# Patient Record
Sex: Female | Born: 1955 | Race: White | Hispanic: No | Marital: Married | Smoking: Former smoker
Health system: Southern US, Community
[De-identification: ages and names within clinical notes are randomized; demographics above are authoritative.]

## PROBLEM LIST (undated history)

## (undated) DIAGNOSIS — G4730 Sleep apnea, unspecified: Secondary | ICD-10-CM

## (undated) DIAGNOSIS — E0590 Thyrotoxicosis, unspecified without thyrotoxic crisis or storm: Secondary | ICD-10-CM

## (undated) DIAGNOSIS — J45909 Unspecified asthma, uncomplicated: Secondary | ICD-10-CM

## (undated) DIAGNOSIS — O24419 Gestational diabetes mellitus in pregnancy, unspecified control: Secondary | ICD-10-CM

## (undated) DIAGNOSIS — E039 Hypothyroidism, unspecified: Secondary | ICD-10-CM

## (undated) DIAGNOSIS — N200 Calculus of kidney: Secondary | ICD-10-CM

## (undated) DIAGNOSIS — Z1509 Genetic susceptibility to other malignant neoplasm: Secondary | ICD-10-CM

## (undated) DIAGNOSIS — C801 Malignant (primary) neoplasm, unspecified: Secondary | ICD-10-CM

## (undated) DIAGNOSIS — Z1501 Genetic susceptibility to malignant neoplasm of breast: Secondary | ICD-10-CM

## (undated) HISTORY — PX: OOPHORECTOMY: SHX86

## (undated) HISTORY — DX: Sleep apnea, unspecified: G47.30

## (undated) HISTORY — DX: Unspecified asthma, uncomplicated: J45.909

## (undated) HISTORY — DX: Hypothyroidism, unspecified: E03.9

## (undated) HISTORY — PX: OTHER SURGICAL HISTORY: SHX169

## (undated) HISTORY — DX: Calculus of kidney: N20.0

## (undated) HISTORY — DX: Genetic susceptibility to malignant neoplasm of breast: Z15.01

## (undated) HISTORY — DX: Genetic susceptibility to malignant neoplasm of breast: Z15.09

## (undated) HISTORY — PX: ABDOMINAL HYSTERECTOMY: SHX81

## (undated) HISTORY — DX: Gestational diabetes mellitus in pregnancy, unspecified control: O24.419

## (undated) HISTORY — PX: TONSILLECTOMY AND ADENOIDECTOMY: SUR1326

## (undated) HISTORY — DX: Thyrotoxicosis, unspecified without thyrotoxic crisis or storm: E05.90

---

## 2004-11-28 DIAGNOSIS — Z9221 Personal history of antineoplastic chemotherapy: Secondary | ICD-10-CM

## 2004-11-28 DIAGNOSIS — C801 Malignant (primary) neoplasm, unspecified: Secondary | ICD-10-CM

## 2004-11-28 HISTORY — DX: Personal history of antineoplastic chemotherapy: Z92.21

## 2004-11-28 HISTORY — DX: Malignant (primary) neoplasm, unspecified: C80.1

## 2005-05-10 ENCOUNTER — Ambulatory Visit: Payer: Self-pay

## 2005-06-07 ENCOUNTER — Ambulatory Visit: Payer: Self-pay | Admitting: General Surgery

## 2005-08-05 ENCOUNTER — Ambulatory Visit: Payer: Self-pay | Admitting: Oncology

## 2005-09-12 ENCOUNTER — Ambulatory Visit: Payer: Self-pay | Admitting: Oncology

## 2005-09-28 ENCOUNTER — Ambulatory Visit: Payer: Self-pay | Admitting: Oncology

## 2005-10-28 ENCOUNTER — Ambulatory Visit: Payer: Self-pay | Admitting: Oncology

## 2005-11-30 ENCOUNTER — Ambulatory Visit: Payer: Self-pay | Admitting: Internal Medicine

## 2006-01-04 ENCOUNTER — Ambulatory Visit: Payer: Self-pay | Admitting: Oncology

## 2006-01-30 ENCOUNTER — Ambulatory Visit: Payer: Self-pay | Admitting: Oncology

## 2006-02-26 ENCOUNTER — Ambulatory Visit: Payer: Self-pay | Admitting: Oncology

## 2006-03-24 ENCOUNTER — Emergency Department: Payer: Self-pay | Admitting: Unknown Physician Specialty

## 2006-03-24 ENCOUNTER — Other Ambulatory Visit: Payer: Self-pay

## 2006-03-28 ENCOUNTER — Ambulatory Visit: Payer: Self-pay | Admitting: Oncology

## 2006-04-20 ENCOUNTER — Inpatient Hospital Stay: Payer: Self-pay | Admitting: Oncology

## 2006-04-28 ENCOUNTER — Ambulatory Visit: Payer: Self-pay | Admitting: Oncology

## 2006-05-28 ENCOUNTER — Ambulatory Visit: Payer: Self-pay | Admitting: Oncology

## 2006-06-28 ENCOUNTER — Ambulatory Visit: Payer: Self-pay | Admitting: Oncology

## 2006-07-29 ENCOUNTER — Ambulatory Visit: Payer: Self-pay | Admitting: Oncology

## 2006-08-28 ENCOUNTER — Ambulatory Visit: Payer: Self-pay | Admitting: Oncology

## 2006-09-28 ENCOUNTER — Ambulatory Visit: Payer: Self-pay | Admitting: Oncology

## 2006-10-04 ENCOUNTER — Ambulatory Visit: Payer: Self-pay | Admitting: Urology

## 2006-11-15 ENCOUNTER — Ambulatory Visit: Payer: Self-pay | Admitting: Oncology

## 2006-11-28 ENCOUNTER — Ambulatory Visit: Payer: Self-pay | Admitting: Oncology

## 2006-12-29 ENCOUNTER — Ambulatory Visit: Payer: Self-pay | Admitting: Oncology

## 2007-01-27 ENCOUNTER — Ambulatory Visit: Payer: Self-pay | Admitting: Oncology

## 2007-01-27 ENCOUNTER — Ambulatory Visit: Payer: Self-pay | Admitting: Internal Medicine

## 2007-02-27 ENCOUNTER — Ambulatory Visit: Payer: Self-pay | Admitting: Oncology

## 2007-02-27 ENCOUNTER — Ambulatory Visit: Payer: Self-pay | Admitting: Internal Medicine

## 2007-03-29 ENCOUNTER — Ambulatory Visit: Payer: Self-pay | Admitting: Oncology

## 2007-03-29 ENCOUNTER — Ambulatory Visit: Payer: Self-pay | Admitting: Internal Medicine

## 2007-03-30 ENCOUNTER — Ambulatory Visit: Payer: Self-pay | Admitting: Gastroenterology

## 2007-04-29 ENCOUNTER — Ambulatory Visit: Payer: Self-pay | Admitting: Internal Medicine

## 2007-05-03 ENCOUNTER — Ambulatory Visit: Payer: Self-pay | Admitting: Internal Medicine

## 2007-05-29 ENCOUNTER — Ambulatory Visit: Payer: Self-pay | Admitting: Internal Medicine

## 2007-06-29 ENCOUNTER — Ambulatory Visit: Payer: Self-pay | Admitting: Internal Medicine

## 2007-07-30 ENCOUNTER — Ambulatory Visit: Payer: Self-pay | Admitting: Internal Medicine

## 2007-08-29 ENCOUNTER — Ambulatory Visit: Payer: Self-pay | Admitting: Internal Medicine

## 2007-09-29 ENCOUNTER — Ambulatory Visit: Payer: Self-pay | Admitting: Oncology

## 2007-10-24 ENCOUNTER — Ambulatory Visit: Payer: Self-pay | Admitting: Internal Medicine

## 2007-10-29 ENCOUNTER — Ambulatory Visit: Payer: Self-pay | Admitting: Oncology

## 2007-10-29 ENCOUNTER — Ambulatory Visit: Payer: Self-pay | Admitting: Internal Medicine

## 2007-11-29 ENCOUNTER — Ambulatory Visit: Payer: Self-pay | Admitting: Oncology

## 2007-12-05 ENCOUNTER — Ambulatory Visit: Payer: Self-pay | Admitting: Oncology

## 2007-12-13 ENCOUNTER — Ambulatory Visit: Payer: Self-pay | Admitting: Gynecologic Oncology

## 2007-12-30 ENCOUNTER — Ambulatory Visit: Payer: Self-pay | Admitting: Oncology

## 2008-01-27 ENCOUNTER — Ambulatory Visit: Payer: Self-pay | Admitting: Oncology

## 2008-02-27 ENCOUNTER — Ambulatory Visit: Payer: Self-pay | Admitting: Oncology

## 2008-03-14 ENCOUNTER — Ambulatory Visit: Payer: Self-pay | Admitting: Oncology

## 2008-03-28 ENCOUNTER — Ambulatory Visit: Payer: Self-pay | Admitting: Oncology

## 2008-05-06 DIAGNOSIS — Z8249 Family history of ischemic heart disease and other diseases of the circulatory system: Secondary | ICD-10-CM | POA: Insufficient documentation

## 2008-05-06 DIAGNOSIS — Z833 Family history of diabetes mellitus: Secondary | ICD-10-CM | POA: Insufficient documentation

## 2008-05-06 DIAGNOSIS — J45909 Unspecified asthma, uncomplicated: Secondary | ICD-10-CM | POA: Insufficient documentation

## 2008-05-08 ENCOUNTER — Ambulatory Visit: Payer: Self-pay | Admitting: Gynecologic Oncology

## 2008-05-13 ENCOUNTER — Ambulatory Visit: Payer: Self-pay | Admitting: Oncology

## 2008-05-28 ENCOUNTER — Ambulatory Visit: Payer: Self-pay | Admitting: Oncology

## 2008-05-29 ENCOUNTER — Ambulatory Visit: Payer: Self-pay

## 2008-06-28 ENCOUNTER — Ambulatory Visit: Payer: Self-pay | Admitting: Oncology

## 2008-06-28 ENCOUNTER — Ambulatory Visit: Payer: Self-pay | Admitting: Gynecologic Oncology

## 2008-07-01 ENCOUNTER — Ambulatory Visit: Payer: Self-pay | Admitting: Gynecologic Oncology

## 2008-07-08 ENCOUNTER — Ambulatory Visit: Payer: Self-pay | Admitting: Gynecologic Oncology

## 2008-07-29 ENCOUNTER — Ambulatory Visit: Payer: Self-pay | Admitting: Gynecologic Oncology

## 2008-07-29 ENCOUNTER — Ambulatory Visit: Payer: Self-pay | Admitting: Oncology

## 2008-08-28 ENCOUNTER — Ambulatory Visit: Payer: Self-pay | Admitting: Internal Medicine

## 2008-09-12 ENCOUNTER — Ambulatory Visit: Payer: Self-pay | Admitting: Oncology

## 2008-09-28 ENCOUNTER — Ambulatory Visit: Payer: Self-pay | Admitting: Oncology

## 2008-09-28 ENCOUNTER — Ambulatory Visit: Payer: Self-pay | Admitting: Internal Medicine

## 2008-10-28 ENCOUNTER — Ambulatory Visit: Payer: Self-pay | Admitting: Oncology

## 2008-11-28 ENCOUNTER — Ambulatory Visit: Payer: Self-pay | Admitting: Oncology

## 2008-12-11 ENCOUNTER — Ambulatory Visit: Payer: Self-pay | Admitting: Oncology

## 2008-12-29 ENCOUNTER — Ambulatory Visit: Payer: Self-pay | Admitting: Oncology

## 2009-02-26 ENCOUNTER — Ambulatory Visit: Payer: Self-pay | Admitting: Gynecologic Oncology

## 2009-03-02 ENCOUNTER — Ambulatory Visit: Payer: Self-pay | Admitting: Gynecologic Oncology

## 2009-03-17 ENCOUNTER — Ambulatory Visit: Payer: Self-pay | Admitting: Oncology

## 2009-03-28 ENCOUNTER — Ambulatory Visit: Payer: Self-pay | Admitting: Oncology

## 2009-05-19 ENCOUNTER — Ambulatory Visit: Payer: Self-pay | Admitting: Oncology

## 2009-05-26 ENCOUNTER — Ambulatory Visit: Payer: Self-pay | Admitting: Gynecologic Oncology

## 2009-05-28 ENCOUNTER — Ambulatory Visit: Payer: Self-pay | Admitting: Oncology

## 2009-08-28 ENCOUNTER — Ambulatory Visit: Payer: Self-pay | Admitting: Oncology

## 2009-09-08 ENCOUNTER — Ambulatory Visit: Payer: Self-pay | Admitting: Gynecologic Oncology

## 2009-09-28 ENCOUNTER — Ambulatory Visit: Payer: Self-pay | Admitting: Gynecologic Oncology

## 2009-12-15 ENCOUNTER — Ambulatory Visit: Payer: Self-pay | Admitting: Oncology

## 2009-12-29 ENCOUNTER — Ambulatory Visit: Payer: Self-pay | Admitting: Oncology

## 2010-01-26 ENCOUNTER — Ambulatory Visit: Payer: Self-pay | Admitting: Oncology

## 2010-02-15 ENCOUNTER — Ambulatory Visit: Payer: Self-pay | Admitting: Gynecologic Oncology

## 2010-02-16 ENCOUNTER — Ambulatory Visit: Payer: Self-pay | Admitting: Gynecologic Oncology

## 2010-02-19 DIAGNOSIS — R002 Palpitations: Secondary | ICD-10-CM | POA: Insufficient documentation

## 2010-02-19 DIAGNOSIS — C569 Malignant neoplasm of unspecified ovary: Secondary | ICD-10-CM | POA: Insufficient documentation

## 2010-02-19 DIAGNOSIS — G4730 Sleep apnea, unspecified: Secondary | ICD-10-CM | POA: Insufficient documentation

## 2010-02-19 DIAGNOSIS — G4733 Obstructive sleep apnea (adult) (pediatric): Secondary | ICD-10-CM | POA: Insufficient documentation

## 2010-02-26 ENCOUNTER — Ambulatory Visit: Payer: Self-pay | Admitting: Gynecologic Oncology

## 2010-02-26 ENCOUNTER — Ambulatory Visit: Payer: Self-pay | Admitting: Oncology

## 2010-07-28 ENCOUNTER — Ambulatory Visit: Payer: Self-pay | Admitting: Gynecologic Oncology

## 2010-07-28 ENCOUNTER — Ambulatory Visit: Payer: Self-pay | Admitting: Oncology

## 2010-07-29 ENCOUNTER — Ambulatory Visit: Payer: Self-pay | Admitting: Oncology

## 2010-07-29 LAB — CA 125: CA 125: 7.9 U/mL (ref 0.0–34.0)

## 2010-08-03 ENCOUNTER — Ambulatory Visit: Payer: Self-pay | Admitting: Gynecologic Oncology

## 2010-08-28 ENCOUNTER — Ambulatory Visit: Payer: Self-pay | Admitting: Oncology

## 2011-03-08 ENCOUNTER — Ambulatory Visit: Payer: Self-pay | Admitting: Oncology

## 2011-03-08 ENCOUNTER — Ambulatory Visit: Payer: Self-pay | Admitting: Gynecologic Oncology

## 2011-03-09 LAB — CA 125: CA 125: 8.5 U/mL (ref 0.0–34.0)

## 2011-03-29 ENCOUNTER — Ambulatory Visit: Payer: Self-pay | Admitting: Oncology

## 2011-12-08 ENCOUNTER — Other Ambulatory Visit: Payer: Self-pay | Admitting: Podiatry

## 2012-03-02 ENCOUNTER — Ambulatory Visit: Payer: Self-pay | Admitting: Oncology

## 2012-03-02 LAB — POTASSIUM: Potassium: 3.5 mmol/L (ref 3.5–5.1)

## 2012-03-02 LAB — MAGNESIUM: Magnesium: 1.4 mg/dL — ABNORMAL LOW

## 2012-03-03 LAB — CA 125: CA 125: 7.3 U/mL (ref 0.0–34.0)

## 2012-03-28 ENCOUNTER — Ambulatory Visit: Payer: Self-pay

## 2012-03-28 ENCOUNTER — Ambulatory Visit: Payer: Self-pay | Admitting: Oncology

## 2012-05-29 ENCOUNTER — Ambulatory Visit: Payer: Self-pay | Admitting: Oncology

## 2012-06-08 DIAGNOSIS — Z8543 Personal history of malignant neoplasm of ovary: Secondary | ICD-10-CM | POA: Insufficient documentation

## 2012-06-08 DIAGNOSIS — N368 Other specified disorders of urethra: Secondary | ICD-10-CM | POA: Insufficient documentation

## 2012-06-08 DIAGNOSIS — Z1501 Genetic susceptibility to malignant neoplasm of breast: Secondary | ICD-10-CM | POA: Insufficient documentation

## 2012-06-08 DIAGNOSIS — Z87442 Personal history of urinary calculi: Secondary | ICD-10-CM | POA: Insufficient documentation

## 2012-06-08 DIAGNOSIS — Z1502 Genetic susceptibility to malignant neoplasm of ovary: Secondary | ICD-10-CM | POA: Insufficient documentation

## 2012-06-28 ENCOUNTER — Ambulatory Visit: Payer: Self-pay | Admitting: Oncology

## 2013-02-25 ENCOUNTER — Ambulatory Visit: Payer: Self-pay | Admitting: Family Medicine

## 2014-11-18 ENCOUNTER — Ambulatory Visit: Payer: Self-pay | Admitting: Family Medicine

## 2014-11-27 LAB — BASIC METABOLIC PANEL
BUN: 15 mg/dL (ref 4–21)
Creatinine: 0.7 mg/dL (ref 0.5–1.1)
Glucose: 96 mg/dL
Potassium: 4 mmol/L (ref 3.4–5.3)
Sodium: 143 mmol/L (ref 137–147)

## 2014-11-27 LAB — TSH: TSH: 1.79 u[IU]/mL (ref 0.41–5.90)

## 2014-11-27 LAB — LIPID PANEL
Cholesterol: 221 mg/dL — AB (ref 0–200)
HDL: 55 mg/dL (ref 35–70)
LDL Cholesterol: 140 mg/dL
Triglycerides: 132 mg/dL (ref 40–160)

## 2014-11-27 LAB — HEPATIC FUNCTION PANEL
ALT: 18 U/L (ref 7–35)
AST: 18 U/L (ref 13–35)

## 2014-11-27 LAB — CBC AND DIFFERENTIAL
HCT: 42 % (ref 36–46)
Hemoglobin: 15.1 g/dL (ref 12.0–16.0)
Platelets: 261 10*3/uL (ref 150–399)
WBC: 7.1 10^3/mL

## 2014-11-27 LAB — HEMOGLOBIN A1C: Hgb A1c MFr Bld: 5.7 % (ref 4.0–6.0)

## 2015-01-02 ENCOUNTER — Ambulatory Visit: Payer: Self-pay | Admitting: Family Medicine

## 2015-04-09 DIAGNOSIS — R270 Ataxia, unspecified: Secondary | ICD-10-CM | POA: Insufficient documentation

## 2015-04-09 DIAGNOSIS — E785 Hyperlipidemia, unspecified: Secondary | ICD-10-CM | POA: Insufficient documentation

## 2015-04-09 DIAGNOSIS — Z8669 Personal history of other diseases of the nervous system and sense organs: Secondary | ICD-10-CM | POA: Insufficient documentation

## 2015-04-09 DIAGNOSIS — N3010 Interstitial cystitis (chronic) without hematuria: Secondary | ICD-10-CM | POA: Insufficient documentation

## 2015-04-09 DIAGNOSIS — Z1501 Genetic susceptibility to malignant neoplasm of breast: Secondary | ICD-10-CM | POA: Insufficient documentation

## 2015-06-08 ENCOUNTER — Encounter: Payer: Self-pay | Admitting: Family Medicine

## 2015-06-19 ENCOUNTER — Encounter: Payer: Self-pay | Admitting: Physician Assistant

## 2015-06-19 ENCOUNTER — Ambulatory Visit (INDEPENDENT_AMBULATORY_CARE_PROVIDER_SITE_OTHER): Payer: BC Managed Care – PPO | Admitting: Physician Assistant

## 2015-06-19 VITALS — BP 110/78 | HR 80 | Temp 99.0°F | Resp 16 | Ht 64.0 in | Wt 165.0 lb

## 2015-06-19 DIAGNOSIS — N390 Urinary tract infection, site not specified: Secondary | ICD-10-CM

## 2015-06-19 DIAGNOSIS — Z87440 Personal history of urinary (tract) infections: Secondary | ICD-10-CM

## 2015-06-19 DIAGNOSIS — R319 Hematuria, unspecified: Secondary | ICD-10-CM

## 2015-06-19 LAB — POCT URINALYSIS DIPSTICK
Bilirubin, UA: NEGATIVE
Glucose, UA: NEGATIVE
Ketones, UA: NEGATIVE
Nitrite, UA: NEGATIVE
Protein, UA: NEGATIVE
Spec Grav, UA: 1.02
Urobilinogen, UA: 0.2
pH, UA: 6

## 2015-06-19 MED ORDER — SULFAMETHOXAZOLE-TRIMETHOPRIM 800-160 MG PO TABS: 1.0000 | ORAL_TABLET | Freq: Two times a day (BID) | ORAL | Status: DC

## 2015-06-19 NOTE — Progress Notes (Signed)
Subjective:     Patient ID: Joyce Decker, female   DOB: [birth year], 59 y.o.   MRN: [id number]  Urinary Tract Infection  This is a new problem. The current episode started today. The problem occurs every urination. The problem has been gradually worsening. There has been no fever. Associated symptoms include a discharge, frequency, hematuria and urgency. She has tried nothing for the symptoms. Her past medical history is significant for kidney stones.   she does have a complex medical history including history of C3 and varying cancer. She is status post oophorectomy. She is BRCA1 and BRCA2 positive. She does still have her natural breast tissue and no personal history of breast cancer. After the oophorectomy in 2013 she started getting recurrent UTIs. She has been seen Dr. Bernardo Heater (urology) and also was referred to Dr. Sherol Dade. Dr. Sherol Dade had wanted to refer her to a female urine gynecologist for further evaluation. She has not followed through with this referral yet but would like another referral to a urine gynecologist at [facility] for further evaluation of her recurrent UTIs. She is concerned of possible metastases of cancer due to her history. Her most recent CEA 125 marker was slightly elevated from her normal ranges but still within the normal range. Her CEA 125 marker is not a good indicator as even when she had ovarian cancer it was only 29 at its highest.   Review of Systems  Constitutional: Negative.   Gastrointestinal: Negative.   Genitourinary: Positive for urgency, frequency and hematuria.       Objective:   Physical Exam  Constitutional: She appears well-developed and well-nourished. No distress.  Cardiovascular: Normal rate, regular rhythm and normal heart sounds.  Exam reveals no gallop and no friction rub.   No murmur heard. Pulmonary/Chest: Effort normal and breath sounds normal. No respiratory distress. She has no wheezes. She has no rales.  Abdominal: Soft. Bowel sounds are  normal. She exhibits no distension and no mass. There is tenderness in the suprapubic area. There is no rebound, no guarding and no CVA tenderness.  Skin: She is not diaphoretic.  Vitals reviewed.      Assessment:     1. Urinary tract infection with hematuria, site unspecified   2. History of recurrent UTIs       Plan:     1. Urinary tract infection with hematuria, site unspecified - POCT Urinalysis Dipstick - Urine Culture - sulfamethoxazole-trimethoprim (BACTRIM DS,SEPTRA DS) 800-160 MG per tablet; Take 1 tablet by mouth 2 (two) times daily.  Dispense: 14 tablet; Refill: 0 - Ambulatory referral to Urogynecology  2. History of recurrent UTIs - Ambulatory referral to Urogynecology

## 2015-06-19 NOTE — Patient Instructions (Signed)

## 2015-06-21 LAB — URINE CULTURE: Organism ID, Bacteria: NO GROWTH

## 2015-06-22 ENCOUNTER — Ambulatory Visit (INDEPENDENT_AMBULATORY_CARE_PROVIDER_SITE_OTHER): Payer: BC Managed Care – PPO | Admitting: Family Medicine

## 2015-06-22 ENCOUNTER — Encounter: Payer: Self-pay | Admitting: Family Medicine

## 2015-06-22 VITALS — BP 112/74 | HR 84 | Temp 98.3°F | Resp 16 | Wt 165.0 lb

## 2015-06-22 DIAGNOSIS — T148XXA Other injury of unspecified body region, initial encounter: Secondary | ICD-10-CM

## 2015-06-22 DIAGNOSIS — Z23 Encounter for immunization: Secondary | ICD-10-CM | POA: Diagnosis not present

## 2015-06-22 DIAGNOSIS — T148 Other injury of unspecified body region: Secondary | ICD-10-CM

## 2015-06-22 MED ORDER — CIPROFLOXACIN HCL 500 MG PO TABS: 500.0000 mg | ORAL_TABLET | Freq: Two times a day (BID) | ORAL | Status: DC

## 2015-06-22 NOTE — Progress Notes (Signed)
Patient ID: Joyce Decker, female   DOB: 1956/08/03, 59 y.o.   MRN: [id number]        Patient: Joyce Decker Female    DOB: [birth year]   59 y.o.   MRN: [id number] Visit Date: 06/22/2015  Today's Provider: Margarita Rana, MD   Chief Complaint  Patient presents with  . Puncture Wound   Subjective:    Foot Injury  The incident occurred 12 to 24 hours ago. The incident occurred at home. The pain is present in the left foot. Quality: Throbbing; painful to walk on. The pain is mild. The pain has been constant since onset. Possible foreign bodies include metal. The symptoms are aggravated by movement.      No Known Allergies Previous Medications   SULFAMETHOXAZOLE-TRIMETHOPRIM (BACTRIM DS,SEPTRA DS) 800-160 MG PER TABLET    Take 1 tablet by mouth 2 (two) times daily.    Review of Systems  Constitutional: Negative.   Respiratory: Negative.   Cardiovascular: Negative.   Musculoskeletal: Positive for joint swelling (swelling around puncture wound, tender).  Skin: Negative.   Psychiatric/Behavioral: Negative for agitation.    History  Substance Use Topics  . Smoking status: Never Smoker   . Smokeless tobacco: Not on file  . Alcohol Use: No   Objective:   BP 112/74 mmHg  Pulse 84  Temp(Src) 98.3 F (36.8 C) (Oral)  Resp 16  Wt 165 lb (74.844 kg)  Physical Exam  Constitutional: She appears well-developed and well-nourished.  Cardiovascular: Normal rate and regular rhythm.   Pulmonary/Chest: Effort normal and breath sounds normal.  Musculoskeletal:  Tender puncture wound with erythema and bruising.    Neurological: She is alert.   BP 112/74 mmHg  Pulse 84  Temp(Src) 98.3 F (36.8 C) (Oral)  Resp 16  Wt 165 lb (74.844 kg)      Assessment & Plan:     1. Puncture wound Will give tetanus today.   Add Cipro for pseudomonas.  Call if worsens or does not improve.   - Tdap vaccine greater than or equal to 7yo IM - ciprofloxacin (CIPRO) 500 MG tablet; Take 1 tablet (500  mg total) by mouth 2 (two) times daily.  Dispense: 20 tablet; Refill: 0  Margarita Rana, MD     Margarita Rana, MD  [facility]

## 2015-06-23 DIAGNOSIS — Z23 Encounter for immunization: Secondary | ICD-10-CM

## 2015-06-23 DIAGNOSIS — T148 Other injury of unspecified body region: Secondary | ICD-10-CM | POA: Diagnosis not present

## 2015-07-02 ENCOUNTER — Encounter: Payer: Self-pay | Admitting: *Deleted

## 2015-07-03 ENCOUNTER — Ambulatory Visit
Admission: RE | Admit: 2015-07-03 | Discharge: 2015-07-03 | Disposition: A | Discharge status: Home / Self Care | Payer: BC Managed Care – PPO | Source: Ambulatory Visit | Attending: Gastroenterology | Admitting: Gastroenterology

## 2015-07-03 ENCOUNTER — Ambulatory Visit: Payer: BC Managed Care – PPO | Admitting: Anesthesiology

## 2015-07-03 ENCOUNTER — Encounter: Payer: Self-pay | Admitting: *Deleted

## 2015-07-03 ENCOUNTER — Encounter
Admission: RE | Disposition: A | Discharge status: Home / Self Care | Payer: Self-pay | Source: Ambulatory Visit | Attending: Gastroenterology

## 2015-07-03 DIAGNOSIS — Z90710 Acquired absence of both cervix and uterus: Secondary | ICD-10-CM | POA: Diagnosis not present

## 2015-07-03 DIAGNOSIS — Z1211 Encounter for screening for malignant neoplasm of colon: Secondary | ICD-10-CM | POA: Diagnosis present

## 2015-07-03 DIAGNOSIS — Z9889 Other specified postprocedural states: Secondary | ICD-10-CM | POA: Insufficient documentation

## 2015-07-03 DIAGNOSIS — J45909 Unspecified asthma, uncomplicated: Secondary | ICD-10-CM | POA: Diagnosis not present

## 2015-07-03 DIAGNOSIS — Z833 Family history of diabetes mellitus: Secondary | ICD-10-CM | POA: Diagnosis not present

## 2015-07-03 DIAGNOSIS — Z79899 Other long term (current) drug therapy: Secondary | ICD-10-CM | POA: Insufficient documentation

## 2015-07-03 DIAGNOSIS — K640 First degree hemorrhoids: Secondary | ICD-10-CM | POA: Insufficient documentation

## 2015-07-03 DIAGNOSIS — Z859 Personal history of malignant neoplasm, unspecified: Secondary | ICD-10-CM | POA: Insufficient documentation

## 2015-07-03 DIAGNOSIS — G4730 Sleep apnea, unspecified: Secondary | ICD-10-CM | POA: Diagnosis not present

## 2015-07-03 DIAGNOSIS — Z8249 Family history of ischemic heart disease and other diseases of the circulatory system: Secondary | ICD-10-CM | POA: Insufficient documentation

## 2015-07-03 DIAGNOSIS — Z8371 Family history of colonic polyps: Secondary | ICD-10-CM | POA: Diagnosis not present

## 2015-07-03 HISTORY — PX: COLONOSCOPY WITH PROPOFOL: SHX5780

## 2015-07-03 HISTORY — DX: Malignant (primary) neoplasm, unspecified: C80.1

## 2015-07-03 SURGERY — COLONOSCOPY WITH PROPOFOL
Anesthesia: General

## 2015-07-03 MED ORDER — PROPOFOL 10 MG/ML IV BOLUS
INTRAVENOUS | Status: DC | PRN
  Administered 2015-07-03: 70 mg via INTRAVENOUS
  Administered 2015-07-03 (×2): 30 mg via INTRAVENOUS

## 2015-07-03 MED ORDER — PHENYLEPHRINE HCL 10 MG/ML IJ SOLN
INTRAMUSCULAR | Status: DC | PRN
  Administered 2015-07-03 (×3): 100 ug via INTRAVENOUS

## 2015-07-03 MED ORDER — SODIUM CHLORIDE 0.9 % IV SOLN
INTRAVENOUS | Status: DC
  Administered 2015-07-03: 11:00:00 via INTRAVENOUS

## 2015-07-03 MED ORDER — SODIUM CHLORIDE 0.9 % IV SOLN: INTRAVENOUS | Status: DC

## 2015-07-03 MED ORDER — PROPOFOL INFUSION 10 MG/ML OPTIME
INTRAVENOUS | Status: DC | PRN
  Administered 2015-07-03: 140 ug/kg/min via INTRAVENOUS

## 2015-07-03 MED ORDER — LIDOCAINE HCL (CARDIAC) 20 MG/ML IV SOLN
INTRAVENOUS | Status: DC | PRN
  Administered 2015-07-03: 20 mg via INTRAVENOUS

## 2015-07-03 NOTE — Discharge Instructions (Signed)

## 2015-07-03 NOTE — H&P (Signed)
  Primary Care Physician:  Margarita Rana, MD  Pre-Procedure History & Physical: HPI:  Joyce Decker is a 59 y.o. female is here for an colonoscopy.   Past Medical History  Diagnosis Date  . Asthma   . Sleep apnea     CPAP  . Cancer     Past Surgical History  Procedure Laterality Date  . Tonsillectomy and adenoidectomy    . Abdominal hysterectomy Bilateral     AND BSO  . Oophorectomy      Prior to Admission medications   Medication Sig Start Date End Date Taking? Authorizing Provider  Alpha-Lipoic Acid 200 MG CAPS Take by mouth.   Yes Historical Provider, MD  BIOTIN PO Take by mouth.   Yes Historical Provider, MD  calcium-vitamin D (OSCAL WITH D) 500-200 MG-UNIT per tablet Take 1 tablet by mouth.   Yes Historical Provider, MD  Wallace Cullens POWD by Does not apply route.   Yes Historical Provider, MD  magnesium oxide (MAG-OX) 400 MG tablet Take 400 mg by mouth daily.   Yes Historical Provider, MD  Melatonin 5 MG CAPS Take by mouth.   Yes Historical Provider, MD  Multiple Vitamin (MULTIVITAMIN) capsule Take 1 capsule by mouth daily.   Yes Historical Provider, MD  ciprofloxacin (CIPRO) 500 MG tablet Take 1 tablet (500 mg total) by mouth 2 (two) times daily. Patient not taking: Reported on 07/03/2015 06/22/15   Margarita Rana, MD  sulfamethoxazole-trimethoprim (BACTRIM DS,SEPTRA DS) 800-160 MG per tablet Take 1 tablet by mouth 2 (two) times daily. Patient not taking: Reported on 07/03/2015 06/19/15   Mar Daring, PA-C    Allergies as of 06/28/2015  . (No Known Allergies)    Family History  Problem Relation Age of Onset  . Diabetes Mother   . Hypertension Mother     History   Social History  . Marital Status: Married    Spouse Name: N/A  . Number of Children: N/A  . Years of Education: N/A   Occupational History  . Not on file.   Social History Main Topics  . Smoking status: Never Smoker   . Smokeless tobacco: Not on file  . Alcohol Use: No  . Drug Use: No  .  Sexual Activity: Not on file   Other Topics Concern  . Not on file   Social History Narrative     Physical Exam: Pulse 88  Temp(Src) 97.1 F (36.2 C) (Tympanic)  Resp 24  Ht 5\' 4"  (1.626 m)  Wt 74.39 kg (164 lb)  BMI 28.14 kg/m2 General:   Alert,  pleasant and cooperative in NAD Head:  Normocephalic and atraumatic. Neck:  Supple; no masses or thyromegaly. Lungs:  Clear throughout to auscultation.    Heart:  Regular rate and rhythm. Abdomen:  Soft, nontender and nondistended. Normal bowel sounds, without guarding, and without rebound.   Neurologic:  Alert and  oriented x4;  grossly normal neurologically.  Impression/Plan: Joyce Decker is here for an colonoscopy to be performed for screening, + fam hx polyps  Risks, benefits, limitations, and alternatives regarding  colonoscopy have been reviewed with the patient.  Questions have been answered.  All parties agreeable.   Josefine Class, MD  07/03/2015, 11:09 AM

## 2015-07-03 NOTE — Transfer of Care (Signed)
Immediate Anesthesia Transfer of Care Note  Patient: Joyce Decker  Procedure(s) Performed: Procedure(s): COLONOSCOPY WITH PROPOFOL (N/A)  Patient Location: PACU and Endoscopy Unit  Anesthesia Type:General  Level of Consciousness: sedated  Airway & Oxygen Therapy: Patient Spontanous Breathing and Patient connected to nasal cannula oxygen  Post-op Assessment: Report given to RN and Post -op Vital signs reviewed and stable  Post vital signs: Reviewed and stable  Last Vitals:  Filed Vitals:   07/03/15 1019  Pulse: 88  Temp: 36.2 C  Resp: 24    Complications: No apparent anesthesia complications

## 2015-07-03 NOTE — Op Note (Signed)
[facility] Gastroenterology Patient Name: Joyce Decker Procedure Date: 07/03/2015 11:07 AM MRN: [id number] Account #: [id number] Date of Birth: 1956-09-21 Admit Type: Outpatient Age: 59 Room: [facility] ENDO ROOM 1 Gender: Female Note Status: Finalized Procedure:         Colonoscopy Indications:       Colon cancer screening in patient at increased risk:                     Family history of colon polyps, Last colonoscopy: 2008 Patient Profile:   This is a 59 year old female. Providers:         Gerrit Heck. Rayann Heman, MD Referring MD:      Jerrell Belfast, MD (Referring MD) Medicines:         Propofol per Anesthesia Complications:     No immediate complications. Procedure:         Pre-Anesthesia Assessment:                    - Prior to the procedure, a History and Physical was                     performed, and patient medications, allergies and                     sensitivities were reviewed. The patient's tolerance of                     previous anesthesia was reviewed.                    After obtaining informed consent, the colonoscope was                     passed under direct vision. Throughout the procedure, the                     patient's blood pressure, pulse, and oxygen saturations                     were monitored continuously. The Colonoscope was                     introduced through the anus and advanced to the the cecum,                     identified by appendiceal orifice and ileocecal valve. The                     colonoscopy was performed without difficulty. The patient                     tolerated the procedure well. The quality of the bowel                     preparation was good. Findings:      The perianal exam findings include non-thrombosed external hemorrhoids.      Internal hemorrhoids were found during retroflexion. The hemorrhoids       were Grade I (internal hemorrhoids that do not prolapse).      The exam was otherwise without  abnormality. Impression:        - Non-thrombosed external hemorrhoids found on perianal                     exam.                    -  Internal hemorrhoids.                    - The examination was otherwise normal.                    - No specimens collected. Recommendation:    - Observe patient in GI recovery unit.                    - High fiber diet.                    - Continue present medications.                    - Repeat colonoscopy in 5 years for screening purposes.                    - Return to referring physician.                    - The findings and recommendations were discussed with the                     patient.                    - The findings and recommendations were discussed with the                     patient's family. Procedure Code(s): --- Professional ---                    [phone], Colonoscopy, flexible; diagnostic, including                     collection of specimen(s) by brushing or washing, when                     performed (separate procedure) CPT copyright 2014 American Medical Association. All rights reserved. The codes documented in this report are preliminary and upon coder review may  be revised to meet current compliance requirements. Mellody Life, MD 07/03/2015 11:42:46 AM This report has been signed electronically. Number of Addenda: 0 Note Initiated On: 07/03/2015 11:07 AM Scope Withdrawal Time: 0 hours 13 minutes 27 seconds  Total Procedure Duration: 0 hours 23 minutes 23 seconds       [facility]

## 2015-07-03 NOTE — Anesthesia Postprocedure Evaluation (Signed)
  Anesthesia Post-op Note  Patient: Joyce Decker  Procedure(s) Performed: Procedure(s): COLONOSCOPY WITH PROPOFOL (N/A)  Anesthesia type:General  Patient location: PACU  Post pain: Pain level controlled  Post assessment: Post-op Vital signs reviewed, Patient's Cardiovascular Status Stable, Respiratory Function Stable, Patent Airway and No signs of Nausea or vomiting  Post vital signs: Reviewed and stable  Last Vitals:  Filed Vitals:   07/03/15 1224  BP: 103/71  Pulse: 73  Temp:   Resp: 16    Level of consciousness: awake, alert  and patient cooperative  Complications: No apparent anesthesia complications

## 2015-07-03 NOTE — Anesthesia Preprocedure Evaluation (Addendum)
Anesthesia Evaluation  Patient identified by MRN, date of birth, ID band Patient awake    Reviewed: Allergy & Precautions, H&P , NPO status , Patient's Chart, lab work & pertinent test results, reviewed documented beta blocker date and time   Airway Mallampati: III  TM Distance: >3 FB Neck ROM: full    Dental  (+) Poor Dentition, Chipped, Caps   Pulmonary asthma , sleep apnea ,  breath sounds clear to auscultation  Pulmonary exam normal       Cardiovascular negative cardio ROS Normal cardiovascular examRhythm:regular Rate:Normal     Neuro/Psych negative neurological ROS  negative psych ROS   GI/Hepatic negative GI ROS, Neg liver ROS,   Endo/Other  negative endocrine ROS  Renal/GU negative Renal ROS  negative genitourinary   Musculoskeletal   Abdominal   Peds  Hematology negative hematology ROS (+)   Anesthesia Other Findings Past Medical History:   Asthma                                                       Sleep apnea                                                    Comment:CPAP   Cancer                                                       Reproductive/Obstetrics negative OB ROS                            Anesthesia Physical Anesthesia Plan  ASA: III  Anesthesia Plan: General   Post-op Pain Management:    Induction:   Airway Management Planned:   Additional Equipment:   Intra-op Plan:   Post-operative Plan:   Informed Consent: I have reviewed the patients History and Physical, chart, labs and discussed the procedure including the risks, benefits and alternatives for the proposed anesthesia with the patient or authorized representative who has indicated his/her understanding and acceptance.   Dental Advisory Given  Plan Discussed with: Anesthesiologist, CRNA and Surgeon  Anesthesia Plan Comments:         Anesthesia Quick Evaluation

## 2015-07-04 ENCOUNTER — Encounter: Payer: Self-pay | Admitting: Gastroenterology

## 2015-07-07 ENCOUNTER — Telehealth: Payer: Self-pay | Admitting: *Deleted

## 2015-07-07 NOTE — Telephone Encounter (Signed)
Called pt to answer questions regarding getting her son evaluated for genetic testing. Pt did not answer. Voicemail left for patient to call back.

## 2015-07-09 ENCOUNTER — Telehealth: Payer: Self-pay | Admitting: *Deleted

## 2015-07-09 NOTE — Telephone Encounter (Signed)
Please have Hayley return call regarding genetic testing.  Sorry she missed call yesterday.

## 2015-07-10 NOTE — Telephone Encounter (Signed)
Pt informed to that her son needs to have testing performed by PCP unless is a patient at the cancer center. Left message on voicemail with this information. Informed pt to callback if has any further questions.

## 2015-07-10 NOTE — Telephone Encounter (Signed)
Called patient back with no answer. Voicemail left for patient to callback.

## 2015-11-07 ENCOUNTER — Other Ambulatory Visit: Payer: Self-pay | Admitting: Family Medicine

## 2015-11-07 DIAGNOSIS — E785 Hyperlipidemia, unspecified: Secondary | ICD-10-CM

## 2015-11-07 DIAGNOSIS — Z8669 Personal history of other diseases of the nervous system and sense organs: Secondary | ICD-10-CM

## 2015-11-09 NOTE — Telephone Encounter (Signed)
Printed, please fax or call in to pharmacy. Thank you.   

## 2016-01-20 ENCOUNTER — Other Ambulatory Visit: Payer: Self-pay | Admitting: Family Medicine

## 2016-01-20 MED ORDER — SULFACETAMIDE SODIUM 10 % OP SOLN: 1.0000 [drp] | OPHTHALMIC | Status: DC

## 2016-01-20 NOTE — Telephone Encounter (Signed)
Pt advised as directed below.   Thanks,   -Tinna Kolker  

## 2016-01-20 NOTE — Telephone Encounter (Signed)
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 Pt would like something sent to [facility] [address]. I advised that she would need OV. Pt requested that a message be sent to Dr. 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 Please advise. Thanks TNP

## 2016-01-20 NOTE — Telephone Encounter (Signed)
Sent rx. OV if not improved. Thanks.

## 2016-03-01 ENCOUNTER — Ambulatory Visit (INDEPENDENT_AMBULATORY_CARE_PROVIDER_SITE_OTHER): Payer: BC Managed Care – PPO | Admitting: Family Medicine

## 2016-03-01 ENCOUNTER — Encounter: Payer: Self-pay | Admitting: Family Medicine

## 2016-03-01 VITALS — BP 128/86 | HR 76 | Temp 98.2°F | Resp 16 | Wt 179.8 lb

## 2016-03-01 DIAGNOSIS — L03012 Cellulitis of left finger: Secondary | ICD-10-CM

## 2016-03-01 MED ORDER — CEPHALEXIN 500 MG PO CAPS: 500.0000 mg | ORAL_CAPSULE | Freq: Three times a day (TID) | ORAL | Status: DC

## 2016-03-01 NOTE — Progress Notes (Signed)
Patient ID: NINJA SIGURDSON, female   DOB: 1956/06/25, 60 y.o.   MRN: [id number]   Patient: Joyce Decker Female    DOB: 02-27-1956   60 y.o.   MRN: [id number] Visit Date: 03/01/2016  Today's Provider: Vernie Murders, PA   Chief Complaint  Patient presents with  . Nail Problem   Subjective:    HPI Patient presents with a nail problem on left thumb. Thumb nail is green in color, swelling, and painful. Problem started 3 days ago. 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   Past Medical History  Diagnosis Date  . Asthma   . Sleep apnea     CPAP  . Cancer [facility])    Past Surgical History  Procedure Laterality Date  . Tonsillectomy and adenoidectomy    . Abdominal hysterectomy Bilateral     AND BSO  . Oophorectomy    . Colonoscopy with propofol N/A 07/03/2015    Procedure: COLONOSCOPY WITH PROPOFOL;  Surgeon: Josefine Class, MD;  Location: [facility] ENDOSCOPY;  Service: Endoscopy;  Laterality: N/A;   Family History  Problem Relation Age of Onset  . Diabetes Mother   . Hypertension Mother    Previous Medications   ALPHA-LIPOIC ACID 200 MG CAPS    Take by mouth.   BIOTIN PO    Take by mouth.   CALCIUM-VITAMIN D (OSCAL WITH D) 500-200 MG-UNIT PER TABLET    Take 1 tablet by mouth.   CINNAMON BARK POWD    by Does not apply route.   CYCLOBENZAPRINE (FLEXERIL) 5 MG TABLET    TAKE 1 TABLET BY MOUTH EVERY EVENING   MAGNESIUM OXIDE (MAG-OX) 400 MG TABLET    Take 400 mg by mouth daily.   MELATONIN 5 MG CAPS    Take by mouth.   MULTIPLE VITAMIN (MULTIVITAMIN) CAPSULE    Take 1 capsule by mouth daily.   No Known Allergies  Review of Systems  Constitutional: Negative.   HENT: Negative.   Eyes: Negative.   Respiratory: Negative.   Cardiovascular: Negative.   Gastrointestinal: Negative.   Endocrine: Negative.   Genitourinary: Negative.   Musculoskeletal: Negative.   Skin: Negative.   Allergic/Immunologic: Negative.   Neurological: Negative.     Hematological: Negative.   Psychiatric/Behavioral: Negative.     Social History  Substance Use Topics  . Smoking status: Never Smoker   . Smokeless tobacco: Not on file  . Alcohol Use: No   Objective:   BP 128/86 mmHg  Pulse 76  Temp(Src) 98.2 F (36.8 C) (Oral)  Resp 16  Wt 179 lb 12.8 oz (81.557 kg)  Physical Exam  Constitutional: She is oriented to person, place, and time. She appears well-developed and well-nourished. No distress.  HENT:  Head: Normocephalic and atraumatic.  Right Ear: Hearing normal.  Left Ear: Hearing normal.  Nose: Nose normal.  Eyes: Conjunctivae and lids are normal. Right eye exhibits no discharge. Left eye exhibits no discharge. No scleral icterus.  Pulmonary/Chest: Effort normal. No respiratory distress.  Musculoskeletal: Normal range of motion. She exhibits tenderness.  Pain and blanching of the left thumb nail with white area at tip of nailbed. No drainage or lymphangitis.  Neurological: She is alert and oriented to person, place, and time.  Skin: Skin is intact. No lesion and no rash noted.  Psychiatric: She has a normal mood and affect. Her speech is normal and behavior is normal. Thought content normal.  Assessment & Plan:     1. Subungual infection of finger of left hand Onset 3 days ago without fever. No known foreign body under nail or cuts/abrasions to the left thumb. Blanching of thumb nail bed with yellow/purulent collection near the tip of digit at edge of nail. Very tender to touch. Refuses to allow attempt to incise or bore hole in nail to release pressure. Will treat with antibiotic and allow to soak in hot Epsom saltwater. Recheck in 3 days. - cephALEXin (KEFLEX) 500 MG capsule; Take 1 capsule (500 mg total) by mouth 3 (three) times daily.  Dispense: 21 capsule; Refill: 0

## 2016-03-04 ENCOUNTER — Ambulatory Visit: Payer: BC Managed Care – PPO | Admitting: Family Medicine

## 2016-03-07 ENCOUNTER — Encounter: Payer: Self-pay | Admitting: Family Medicine

## 2016-03-07 ENCOUNTER — Ambulatory Visit (INDEPENDENT_AMBULATORY_CARE_PROVIDER_SITE_OTHER): Payer: BC Managed Care – PPO | Admitting: Family Medicine

## 2016-03-07 VITALS — BP 116/78 | HR 84 | Temp 98.7°F | Resp 16 | Wt 178.0 lb

## 2016-03-07 DIAGNOSIS — E785 Hyperlipidemia, unspecified: Secondary | ICD-10-CM

## 2016-03-07 DIAGNOSIS — L03012 Cellulitis of left finger: Secondary | ICD-10-CM

## 2016-03-07 DIAGNOSIS — Z8543 Personal history of malignant neoplasm of ovary: Secondary | ICD-10-CM

## 2016-03-07 DIAGNOSIS — Z1501 Genetic susceptibility to malignant neoplasm of breast: Secondary | ICD-10-CM | POA: Diagnosis not present

## 2016-03-07 NOTE — Progress Notes (Signed)
Patient ID: Joyce Decker, female   DOB: June 19, 1956, 60 y.o.   MRN: [id number]   Joyce Decker  MRN: [id number] DOB: [birth year]  Subjective:  HPI   The patient is a 60 year old female who presents for follow up of her infected finger.  She was last seen 03/01/16 and was given Keflex and instructed to soak her finger.  She declines having it opened to drain.  The patient states that her finger is much improved with minimal tenderness.    Patient Active Problem List   Diagnosis Date Noted  . 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 History of migraine headaches 04/09/2015  . HLD (hyperlipidemia) 04/09/2015  . Chronic interstitial cystitis 04/09/2015  . H/O ovarian cancer 06/08/2012  . H/O renal calculi 06/08/2012  . Urethral cyst 06/08/2012  . Carcinoma of ovary, stage 3 ([place]) 02/19/2010  . 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 Awareness of heartbeats 02/19/2010  . Apnea, sleep 02/19/2010  . Airway hyperreactivity 05/06/2008  . Family history of diabetes mellitus 05/06/2008  . Fam hx-ischem heart disease 05/06/2008   Past Surgical History  Procedure Laterality Date  . Tonsillectomy and adenoidectomy    . Abdominal hysterectomy Bilateral     AND BSO  . Oophorectomy    . Colonoscopy with propofol N/A 07/03/2015    Procedure: COLONOSCOPY WITH PROPOFOL;  Surgeon: Josefine Class, MD;  Location: [facility] ENDOSCOPY;  Service: Endoscopy;  Laterality: N/A;   Family History  Problem Relation Age of Onset  . Diabetes Mother   . Hypertension Mother    Past Medical History  Diagnosis Date  . Asthma   . Sleep apnea     CPAP  . Cancer [facility])     Social History   Social History  . Marital Status: Married    Spouse Name: N/A  . Number of Children: N/A  . Years of Education: N/A   Occupational History  . Not on file.   Social History Main Topics  . Smoking status: Never Smoker   . Smokeless tobacco:  Not on file  . Alcohol Use: No  . Drug Use: No  . Sexual Activity: Not on file   Other Topics Concern  . Not on file   Social History Narrative    Outpatient Prescriptions Prior to Visit  Medication Sig Dispense Refill  . Alpha-Lipoic Acid 200 MG CAPS Take by mouth.    Marland Kitchen BIOTIN PO Take by mouth.    . calcium-vitamin D (OSCAL WITH D) 500-200 MG-UNIT per tablet Take 1 tablet by mouth.    . cephALEXin (KEFLEX) 500 MG capsule Take 1 capsule (500 mg total) by mouth 3 (three) times daily. 21 capsule 0  . Cinnamon Bark POWD by Does not apply route.    . cyclobenzaprine (FLEXERIL) 5 MG tablet TAKE 1 TABLET BY MOUTH EVERY EVENING 30 tablet 5  . magnesium oxide (MAG-OX) 400 MG tablet Take 400 mg by mouth daily.    . Melatonin 5 MG CAPS Take by mouth.    . Multiple Vitamin (MULTIVITAMIN) capsule Take 1 capsule by mouth daily.     No facility-administered medications prior to visit.    Allergies  Allergen Reactions  . Other Other (See Comments)    Wheat barley and Grain unknown allergy, possible cause headache.  . Tape Other (See Comments)    Blisters  . Gadobenate Nausea Only    PT became nauseous with contrast, but did  not vomit.    . Iodinated Diagnostic Agents Nausea Only    Review of Systems  Constitutional: Negative for fever and malaise/fatigue.  Respiratory: Negative for cough, shortness of breath and wheezing.   Cardiovascular: Negative for chest pain, palpitations, orthopnea and leg swelling.  Skin:       Left thumb is no longer swollen or sore.   Neurological: Negative for dizziness, weakness and headaches.   Objective:  BP 116/78 mmHg  Pulse 84  Temp(Src) 98.7 F (37.1 C) (Oral)  Resp 16  Wt 178 lb (80.74 kg)  Physical Exam  Constitutional: She is oriented to person, place, and time and well-developed, well-nourished, and in no distress.  HENT:  Head: Normocephalic.  Right Ear: External ear normal.  Left Ear: External ear normal.  Nose: Nose normal.    Mouth/Throat: Oropharynx is clear and moist.  Eyes: Conjunctivae and EOM are normal.  Neck: Normal range of motion. Neck supple. No thyromegaly present.  Cardiovascular: Normal rate and regular rhythm.   Pulmonary/Chest: Effort normal and breath sounds normal.  Abdominal: Soft. Bowel sounds are normal.  Lymphadenopathy:    She has no cervical adenopathy.  Neurological: She is alert and oriented to person, place, and time.  Skin: No rash noted.  Small fissure in skin near distal attachment of nailbed to the left thumbnail. No purulent drainage or pain today. No local lymphadenopathy or lymphangitis.    Assessment and Plan :  1. Subungual infection of finger, left Hot [place] saltwater soaks with Keflex, thumb has greatly improved. Had purulent drainage after using a couple soaks. No further pain or drainage now. Will finish the Keflex in a day or two. Recheck CBC for signs of persistent infection. No lymphangitis or lymphadenopathy. Recheck prn. - CBC with Differential/Platelet  2. H/O ovarian cancer Diagnosed as Stage III - C requiring chemotherapy in 2006 with subsequent abdominal hysterectomy with BSO. Will check CA-125 for signs of recurrence.. - CA 125 - CBC with Differential/Platelet  3. Disorder of magnesium metabolism Having some cramps in feet again and would like to check magnesium level. Still taking Mag-OX 400 mg qd. May use Tonic water (has quinine in it) 4-6 ounces each evening for muscle cramps and fresh fruits/vegetables in diet. - Magnesium  4. 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 Has been considering proactive mastectomies  5. HLD (hyperlipidemia) Last LDL was 140 on 11-28-15 with HDL of 55. Trying to limit fats in diet. Will recheck labs. Using some nutritional supplements but no statins. - CBC with  Differential/Platelet - Comprehensive metabolic panel - Lipid panel - TSH   Vernie Murders, PA-C [facility] 03/07/2016 2:18 PM

## 2016-03-10 LAB — COMPREHENSIVE METABOLIC PANEL
ALT: 24 IU/L (ref 0–32)
AST: 24 IU/L (ref 0–40)
Albumin/Globulin Ratio: 1.6 (ref 1.2–2.2)
Albumin: 4.6 g/dL (ref 3.5–5.5)
Alkaline Phosphatase: 73 IU/L (ref 39–117)
BUN/Creatinine Ratio: 24 — ABNORMAL HIGH (ref 9–23)
BUN: 18 mg/dL (ref 6–24)
Bilirubin Total: 0.4 mg/dL (ref 0.0–1.2)
CO2: 31 mmol/L — ABNORMAL HIGH (ref 18–29)
Calcium: 10.1 mg/dL (ref 8.7–10.2)
Chloride: 96 mmol/L (ref 96–106)
Creatinine, Ser: 0.75 mg/dL (ref 0.57–1.00)
GFR calc Af Amer: 102 mL/min/{1.73_m2} (ref >59)
GFR calc non Af Amer: 89 mL/min/{1.73_m2} (ref >59)
Globulin, Total: 2.8 g/dL (ref 1.5–4.5)
Glucose: 101 mg/dL — ABNORMAL HIGH (ref 65–99)
Potassium: 4 mmol/L (ref 3.5–5.2)
Sodium: 139 mmol/L (ref 134–144)
Total Protein: 7.4 g/dL (ref 6.0–8.5)

## 2016-03-10 LAB — MAGNESIUM: Magnesium: 1.7 mg/dL (ref 1.6–2.3)

## 2016-03-10 LAB — CBC WITH DIFFERENTIAL/PLATELET
Basophils Absolute: 0 10*3/uL (ref 0.0–0.2)
Basos: 1 %
EOS (ABSOLUTE): 0.2 10*3/uL (ref 0.0–0.4)
Eos: 2 %
Hematocrit: 44.3 % (ref 34.0–46.6)
Hemoglobin: 15.3 g/dL (ref 11.1–15.9)
Immature Grans (Abs): 0 10*3/uL (ref 0.0–0.1)
Immature Granulocytes: 0 %
Lymphocytes Absolute: 2.7 10*3/uL (ref 0.7–3.1)
Lymphs: 37 %
MCH: 28.9 pg (ref 26.6–33.0)
MCHC: 34.5 g/dL (ref 31.5–35.7)
MCV: 84 fL (ref 79–97)
Monocytes Absolute: 0.6 10*3/uL (ref 0.1–0.9)
Monocytes: 9 %
Neutrophils Absolute: 3.8 10*3/uL (ref 1.4–7.0)
Neutrophils: 51 %
Platelets: 235 10*3/uL (ref 150–379)
RBC: 5.29 x10E6/uL — ABNORMAL HIGH (ref 3.77–5.28)
RDW: 13.3 % (ref 12.3–15.4)
WBC: 7.3 10*3/uL (ref 3.4–10.8)

## 2016-03-10 LAB — LIPID PANEL
Chol/HDL Ratio: 3.5 ratio units (ref 0.0–4.4)
Cholesterol, Total: 224 mg/dL — ABNORMAL HIGH (ref 100–199)
HDL: 64 mg/dL (ref >39)
LDL Calculated: 145 mg/dL — ABNORMAL HIGH (ref 0–99)
Triglycerides: 75 mg/dL (ref 0–149)
VLDL Cholesterol Cal: 15 mg/dL (ref 5–40)

## 2016-03-10 LAB — TSH: TSH: 1.33 u[IU]/mL (ref 0.450–4.500)

## 2016-03-10 LAB — CA 125: CA 125: 9 U/mL (ref 0.0–38.1)

## 2016-03-11 ENCOUNTER — Telehealth: Payer: Self-pay

## 2016-03-11 NOTE — Telephone Encounter (Signed)
Patient advised as directed below. Patient verbalized understanding. Patient prefers to try lifestyle changes and follow up for lab recheck in 3 months.

## 2016-03-11 NOTE — Telephone Encounter (Signed)
Acknowledged and may schedule in 3 months.

## 2016-03-11 NOTE — Telephone Encounter (Signed)
-----   Message from Margo Common, [place] sent at 03/10/2016 12:44 PM EDT ----- CA 125 still normal range but bouncing around 7-9 range. Cholesterol total above 200 and LDL above goal of 145. Magnesium and thyroid tests in normal range .Consider Pravastatin 20 mg qd #30 & 3 RF to lower cholesterol. Recheck lipids in 3 months.

## 2016-05-03 ENCOUNTER — Other Ambulatory Visit: Payer: Self-pay | Admitting: Family Medicine

## 2016-05-12 ENCOUNTER — Other Ambulatory Visit: Payer: Self-pay | Admitting: Family Medicine

## 2016-06-29 LAB — HM MAMMOGRAPHY

## 2016-11-03 ENCOUNTER — Other Ambulatory Visit: Payer: Self-pay | Admitting: Family Medicine

## 2016-11-04 NOTE — Telephone Encounter (Signed)
Please review-aa 

## 2016-11-23 ENCOUNTER — Ambulatory Visit: Payer: BC Managed Care – PPO | Admitting: Physician Assistant

## 2017-02-28 ENCOUNTER — Encounter: Payer: Self-pay | Admitting: Physician Assistant

## 2017-02-28 ENCOUNTER — Ambulatory Visit (INDEPENDENT_AMBULATORY_CARE_PROVIDER_SITE_OTHER): Payer: BC Managed Care – PPO | Admitting: Physician Assistant

## 2017-02-28 ENCOUNTER — Telehealth: Payer: Self-pay | Admitting: Physician Assistant

## 2017-02-28 VITALS — BP 128/80 | HR 76 | Temp 98.4°F | Resp 16 | Wt 174.0 lb

## 2017-02-28 DIAGNOSIS — Z833 Family history of diabetes mellitus: Secondary | ICD-10-CM | POA: Diagnosis not present

## 2017-02-28 DIAGNOSIS — E663 Overweight: Secondary | ICD-10-CM

## 2017-02-28 DIAGNOSIS — Z8543 Personal history of malignant neoplasm of ovary: Secondary | ICD-10-CM | POA: Diagnosis not present

## 2017-02-28 DIAGNOSIS — Z6829 Body mass index (BMI) 29.0-29.9, adult: Secondary | ICD-10-CM | POA: Diagnosis not present

## 2017-02-28 DIAGNOSIS — E7800 Pure hypercholesterolemia, unspecified: Secondary | ICD-10-CM | POA: Diagnosis not present

## 2017-02-28 MED ORDER — NALTREXONE-BUPROPION HCL ER 8-90 MG PO TB12: 1.0000 | ORAL_TABLET | Freq: Two times a day (BID) | ORAL | 0 refills | Status: DC

## 2017-02-28 NOTE — Patient Instructions (Addendum)
Bupropion; Naltrexone extended-release tablets What is this medicine? BUPROPION; NALTREXONE (byoo PROE pee on; nal TREX one) is a combination product used to promote and maintain weight loss in obese adults or overweight adults who also have weight related medical problems. This medicine should be used with a reduced calorie diet and increased physical activity. This medicine may be used for other purposes; ask your health care provider or pharmacist if you have questions. COMMON BRAND NAME(S): CONTRAVE What should I tell my health care provider before I take this medicine? They need to know if you have any of these conditions: -an eating disorder, such as anorexia or bulimia -bipolar disorder -diabetes -depression -drug abuse or addiction -glaucoma -head injury -heart disease -high blood pressure -history of a tumor or infection of your brain or spine -history of stroke -history of irregular heartbeat -if you often drink alcohol -kidney disease -liver disease -schizophrenia -seizures -suicidal thoughts, plans, or attempt; a previous suicide attempt by you or a family member -an unusual or allergic reaction to bupropion, naltrexone, other medicines, foods, dyes, or preservatives -breast-feeding -pregnant or trying to become pregnant How should I use this medicine? Take this medicine by mouth with a glass of water. Follow the directions on the prescription label. Take this medicine in the morning and in the evenings as directed by your healthcare professional. You can take it with or without food. Do not take with high-fat meals as this may increase your risk of seizures. Do not crush, chew, or cut these tablets. Do not take your medicine more often than directed. Do not stop taking this medicine suddenly except upon the advice of your doctor. A special MedGuide will be given to you by the pharmacist with each prescription and refill. Be sure to read this information carefully each  time. Talk to your pediatrician regarding the use of this medicine in children. Special care may be needed. Overdosage: If you think you have taken too much of this medicine contact a poison control center or emergency room at once. NOTE: This medicine is only for you. Do not share this medicine with others. What if I miss a dose? If you miss a dose, skip the missed dose and take your next tablet at the regular time. Do not take double or extra doses. What may interact with this medicine? Do not take this medicine with any of the following medications: -any prescription or street opioid drug like codeine, heroin, methadone -linezolid -MAOIs like Carbex, Eldepryl, Marplan, Nardil, and Parnate -methylene blue (injected into a vein) -other medicines that contain bupropion like Zyban or Wellbutrin This medicine may also interact with the following medications: -alcohol -certain medicines for anxiety or sleep -certain medicines for blood pressure like metoprolol, propranolol -certain medicines for depression or psychotic disturbances -certain medicines for HIV or AIDS like efavirenz, lopinavir, nelfinavir, ritonavir -certain medicines for irregular heart beat like propafenone, flecainide -certain medicines for Parkinson's disease like amantadine, levodopa -certain medicines for seizures like carbamazepine, phenytoin, phenobarbital -cimetidine -clopidogrel -cyclophosphamide -digoxin -disulfiram -furazolidone -isoniazid -nicotine -orphenadrine -procarbazine -steroid medicines like prednisone or cortisone -stimulant medicines for attention disorders, weight loss, or to stay awake -tamoxifen -theophylline -thioridazine -thiotepa -ticlopidine -tramadol -warfarin This list may not describe all possible interactions. Give your health care provider a list of all the medicines, herbs, non-prescription drugs, or dietary supplements you use. Also tell them if you smoke, drink alcohol, or use  illegal drugs. Some items may interact with your medicine. What should I watch for while using this   medicine? This medicine is intended to be used in addition to a healthy diet and appropriate exercise. The best results are achieved this way. Do not increase or in any way change your dose without consulting your doctor or health care professional. Do not take this medicine with other prescription or over-the-counter weight loss products without consulting your doctor or health care professional. Your doctor should tell you to stop taking this medicine if you do not lose a certain amount of weight within the first 12 weeks of treatment. Visit your doctor or health care professional for regular checkups. Your doctor may order blood tests or other tests to see how you are doing. This medicine may affect blood sugar levels. If you have diabetes, check with your doctor or health care professional before you change your diet or the dose of your diabetic medicine. Patients and their families should watch out for new or worsening depression or thoughts of suicide. Also watch out for sudden changes in feelings such as feeling anxious, agitated, panicky, irritable, hostile, aggressive, impulsive, severely restless, overly excited and hyperactive, or not being able to sleep. If this happens, especially at the beginning of treatment or after a change in dose, call your health care professional. Avoid alcoholic drinks while taking this medicine. Drinking large amounts of alcoholic beverages, using sleeping or anxiety medicines, or quickly stopping the use of these agents while taking this medicine may increase your risk for a seizure. What side effects may I notice from receiving this medicine? Side effects that you should report to your doctor or health care professional as soon as possible: -allergic reactions like skin rash, itching or hives, swelling of the face, lips, or tongue -breathing problems -changes in  vision -confusion -elevated mood, decreased need for sleep, racing thoughts, impulsive behavior -fast or irregular heartbeat -hallucinations, loss of contact with reality -increased blood pressure -redness, blistering, peeling or loosening of the skin, including inside the mouth -seizures -signs and symptoms of liver injury like dark yellow or brown urine; general ill feeling or flu-like symptoms; light-colored stools; loss of appetite; nausea; right upper belly pain; unusually weak or tired; yellowing of the eyes or skin -suicidal thoughts or other mood changes -vomiting Side effects that usually do not require medical attention (report to your doctor or health care professional if they continue or are bothersome): -constipation -headache -loss of appetite -indigestion, stomach upset -tremors This list may not describe all possible side effects. Call your doctor for medical advice about side effects. You may report side effects to FDA at 1-800-FDA-1088. Where should I keep my medicine? Keep out of the reach of children. Store at room temperature between 15 and 30 degrees C (59 and 86 degrees F). Throw away any unused medicine after the expiration date. NOTE: This sheet is a summary. It may not cover all possible information. If you have questions about this medicine, talk to your doctor, pharmacist, or health care provider.  2018 Elsevier/Gold Standard (2016-05-06 13:42:58)   Calorie Counting for Weight Loss Calories are units of energy. Your body needs a certain amount of calories from food to keep you going throughout the day. When you eat more calories than your body needs, your body stores the extra calories as fat. When you eat fewer calories than your body needs, your body burns fat to get the energy it needs. Calorie counting means keeping track of how many calories you eat and drink each day. Calorie counting can be helpful if you need to lose  weight. If you make sure to eat  fewer calories than your body needs, you should lose weight. Ask your health care provider what a healthy weight is for you. For calorie counting to work, you will need to eat the right number of calories in a day in order to lose a healthy amount of weight per week. A dietitian can help you determine how many calories you need in a day and will give you suggestions on how to reach your calorie goal.  A healthy amount of weight to lose per week is usually 1-2 lb (0.5-0.9 kg). This usually means that your daily calorie intake should be reduced by 500-750 calories.  Eating 1,200 - 1,500 calories per day can help most women lose weight.  Eating 1,500 - 1,800 calories per day can help most men lose weight. What is my plan? My goal is to have 1200-1300 calories per day. If I have this many calories per day, I should lose around 1-2 pounds per week. What do I need to know about calorie counting? In order to meet your daily calorie goal, you will need to:  Find out how many calories are in each food you would like to eat. Try to do this before you eat.  Decide how much of the food you plan to eat.  Write down what you ate and how many calories it had. Doing this is called keeping a food log. To successfully lose weight, it is important to balance calorie counting with a healthy lifestyle that includes regular activity. Aim for 150 minutes of moderate exercise (such as walking) or 75 minutes of vigorous exercise (such as running) each week. Where do I find calorie information?   The number of calories in a food can be found on a Nutrition Facts label. If a food does not have a Nutrition Facts label, try to look up the calories online or ask your dietitian for help. Remember that calories are listed per serving. If you choose to have more than one serving of a food, you will have to multiply the calories per serving by the amount of servings you plan to eat. For example, the label on a package of bread  might say that a serving size is 1 slice and that there are 90 calories in a serving. If you eat 1 slice, you will have eaten 90 calories. If you eat 2 slices, you will have eaten 180 calories. How do I keep a food log? Immediately after each meal, record the following information in your food log:  What you ate. Don't forget to include toppings, sauces, and other extras on the food.  How much you ate. This can be measured in cups, ounces, or number of items.  How many calories each food and drink had.  The total number of calories in the meal. Keep your food log near you, such as in a small notebook in your pocket, or use a mobile app or website. Some programs will calculate calories for you and show you how many calories you have left for the day to meet your goal. What are some calorie counting tips?  Use your calories on foods and drinks that will fill you up and not leave you hungry:  Some examples of foods that fill you up are nuts and nut butters, vegetables, lean proteins, and high-fiber foods like whole grains. High-fiber foods are foods with more than 5 g fiber per serving.  Drinks such as sodas, specialty coffee drinks,  alcohol, and juices have a lot of calories, yet do not fill you up.  Eat nutritious foods and avoid empty calories. Empty calories are calories you get from foods or beverages that do not have many vitamins or protein, such as candy, sweets, and soda. It is better to have a nutritious high-calorie food (such as an avocado) than a food with few nutrients (such as a bag of chips).  Know how many calories are in the foods you eat most often. This will help you calculate calorie counts faster.  Pay attention to calories in drinks. Low-calorie drinks include water and unsweetened drinks.  Pay attention to nutrition labels for "low fat" or "fat free" foods. These foods sometimes have the same amount of calories or more calories than the full fat versions. They also  often have added sugar, starch, or salt, to make up for flavor that was removed with the fat.  Find a way of tracking calories that works for you. Get creative. Try different apps or programs if writing down calories does not work for you. What are some portion control tips?  Know how many calories are in a serving. This will help you know how many servings of a certain food you can have.  Use a measuring cup to measure serving sizes. You could also try weighing out portions on a kitchen scale. With time, you will be able to estimate serving sizes for some foods.  Take some time to put servings of different foods on your favorite plates, bowls, and cups so you know what a serving looks like.  Try not to eat straight from a bag or box. Doing this can lead to overeating. Put the amount you would like to eat in a cup or on a plate to make sure you are eating the right portion.  Use smaller plates, glasses, and bowls to prevent overeating.  Try not to multitask (for example, watch TV or use your computer) while eating. If it is time to eat, sit down at a table and enjoy your food. This will help you to know when you are full. It will also help you to be aware of what you are eating and how much you are eating. What are tips for following this plan? Reading food labels   Check the calorie count compared to the serving size. The serving size may be smaller than what you are used to eating.  Check the source of the calories. Make sure the food you are eating is high in vitamins and protein and low in saturated and trans fats. Shopping   Read nutrition labels while you shop. This will help you make healthy decisions before you decide to purchase your food.  Make a grocery list and stick to it. Cooking   Try to cook your favorite foods in a healthier way. For example, try baking instead of frying.  Use low-fat dairy products. Meal planning   Use more fruits and vegetables. Half of your plate  should be fruits and vegetables.  Include lean proteins like poultry and fish. How do I count calories when eating out?  Ask for smaller portion sizes.  Consider sharing an entree and sides instead of getting your own entree.  If you get your own entree, eat only half. Ask for a box at the beginning of your meal and put the rest of your entree in it so you are not tempted to eat it.  If calories are listed on the menu, choose  the lower calorie options.  Choose dishes that include vegetables, fruits, whole grains, low-fat dairy products, and lean protein.  Choose items that are boiled, broiled, grilled, or steamed. Stay away from items that are buttered, battered, fried, or served with cream sauce. Items labeled "crispy" are usually fried, unless stated otherwise.  Choose water, low-fat milk, unsweetened iced tea, or other drinks without added sugar. If you want an alcoholic beverage, choose a lower calorie option such as a glass of wine or light beer.  Ask for dressings, sauces, and syrups on the side. These are usually high in calories, so you should limit the amount you eat.  If you want a salad, choose a garden salad and ask for grilled meats. Avoid extra toppings like bacon, cheese, or fried items. Ask for the dressing on the side, or ask for olive oil and vinegar or lemon to use as dressing.  Estimate how many servings of a food you are given. For example, a serving of cooked rice is  cup or about the size of half a baseball. Knowing serving sizes will help you be aware of how much food you are eating at restaurants. The list below tells you how big or small some common portion sizes are based on everyday objects:  1 oz-4 stacked dice.  3 oz-1 deck of cards.  1 tsp-1 die.  1 Tbsp- a ping-pong ball.  2 Tbsp-1 ping-pong ball.   cup- baseball.  1 cup-1 baseball. Summary  Calorie counting means keeping track of how many calories you eat and drink each day. If you eat  fewer calories than your body needs, you should lose weight.  A healthy amount of weight to lose per week is usually 1-2 lb (0.5-0.9 kg). This usually means reducing your daily calorie intake by 500-750 calories.  The number of calories in a food can be found on a Nutrition Facts label. If a food does not have a Nutrition Facts label, try to look up the calories online or ask your dietitian for help.  Use your calories on foods and drinks that will fill you up, and not on foods and drinks that will leave you hungry.  Use smaller plates, glasses, and bowls to prevent overeating. This information is not intended to replace advice given to you by your health care provider. Make sure you discuss any questions you have with your health care provider. Document Released: 11/14/2005 Document Revised: 10/14/2016 Document Reviewed: 10/14/2016 Elsevier Interactive Patient Education  2017 [employer].

## 2017-02-28 NOTE — Progress Notes (Signed)
Patient: Joyce Decker Female    DOB: [birth year]   61 y.o.   MRN: [id number] Visit Date: 02/28/2017  Today's Provider: Mar Daring, PA-C   Chief Complaint  Patient presents with  . Follow-up    HLD  . Obesity   Subjective:    HPI Patient is here to follow-up on Labs. She also wants to discuss weight gain.   Lipid/Cholesterol, Follow-up:   Last seen for this 12 months ago.  Management changes since that visit include consider Pravastatin 20MG but patient didn't take. Wanted to bring it down with dieting and exercise. . Last Lipid Panel:    Component Value Date/Time   CHOL 224 (H) 03/09/2016 1157   TRIG 75 03/09/2016 1157   HDL 64 03/09/2016 1157   CHOLHDL 3.5 03/09/2016 1157   LDLCALC 145 (H) 03/09/2016 1157    Risk factors for vascular disease include hypercholesterolemia  Current symptoms include none. Weight trend: increasing rapidly Current diet: in general, a "healthy" diet   Current exercise: walking  Wt Readings from Last 3 Encounters:  02/28/17 174 lb (78.9 kg)  03/07/16 178 lb (80.7 kg)  03/01/16 179 lb 12.8 oz (81.6 kg)   ------------------------------------------------------------------- Obesity: Patient complains of obesity. Patient cites health, increased physical ability, self-image as reasons for wanting to lose weight.  Obesity History Weight in late teens: 110-115 lb. Period of greatest weight gain: 179 lb last year Highest adult weight: 174 lbs  History of Weight Loss Efforts Greatest amount of weight lost: 5 lb  Circumstances associated with regain of weight: She is a cancer survivor and she reports that she had a Hysterectomy and she feels like she has been gaining the weight since 2008. Successful weight loss techniques attempted: Low carb diet. She reports she is using myfitness pal to document her calories intake.  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  Current Eating  Habits Number of regular meals per day: 3 Number of snacking episodes per day: 1 Who shops for food? patient Who prepares food? patient Binge behavior?: no Purge behavior? no Anorexic behavior? no Eating precipitated by stress? no Guilt feelings associated with eating? no  Other Potential Contributing Factors Use of alcohol: average 0 drinks/week  Joyce Decker is a 61 yr old female with medical history of ovarian cancer, BRCA 1 positive. She underwent a total hysterectomy and oopherectomy in 2006 putting her in surgical menopause. She has not been a candidate for hormone therapy due to hormone receptor positive testing and still having her breasts. She has considered prophylactic bilateral mastectomy and has been evaluated twice but has had complications each time that has caused her to have to cancel. Most recently she had to cancel because her husband was diagnosed with hepatic cell carcinoma and has been placed on the transplant list so they have been going through work up for that. She does state her oncologist at [facility] has told her that once she has her mastectomy he would ok her to have hormone replacement. She has also had history of magnesium metabolism that has caused her issues of cramping and fatigue. She does take OTC magnesium supplements but has not noticed many changes. She does notice some improvement when she drinks gatorades, but does not like them. She reports she felt her best when her oncologist had her on IV magnesium replacements.     Allergies  Allergen Reactions  . Other Other (See Comments)  Wheat barley and Grain unknown allergy, possible cause headache.  . Tape Other (See Comments)    Blisters  . Gadobenate Nausea Only    PT became nauseous with contrast, but did not vomit.    . Iodinated Diagnostic Agents Nausea Only     Current Outpatient Prescriptions:  .  Alpha-Lipoic Acid 200 MG CAPS, Take by mouth., Disp: , Rfl:  .  BIOTIN PO, Take by mouth., Disp:  , Rfl:  .  calcium-vitamin D (OSCAL WITH D) 500-200 MG-UNIT per tablet, Take 1 tablet by mouth., Disp: , Rfl:  .  Cinnamon Bark POWD, by Does not apply route., Disp: , Rfl:  .  magnesium oxide (MAG-OX) 400 MG tablet, Take 400 mg by mouth daily., Disp: , Rfl:  .  Melatonin 5 MG CAPS, Take by mouth., Disp: , Rfl:  .  Multiple Vitamin (MULTIVITAMIN) capsule, Take 1 capsule by mouth daily., Disp: , Rfl:  .  cephALEXin (KEFLEX) 500 MG capsule, Take 1 capsule (500 mg total) by mouth 3 (three) times daily. (Patient not taking: Reported on 02/28/2017), Disp: 21 capsule, Rfl: 0 .  cyclobenzaprine (FLEXERIL) 5 MG tablet, TAKE 1 TABLET BY MOUTH EVERY EVENING (Patient not taking: Reported on 02/28/2017), Disp: 30 tablet, Rfl: 5 .  oxybutynin (DITROPAN) 5 MG tablet, TAKE 1 TABLET BY MOUTH EVERY 6 HOURS AS NEEDED (Patient not taking: Reported on 02/28/2017), Disp: 30 tablet, Rfl: 5  Review of Systems  Constitutional: Positive for activity change and fatigue. Negative for appetite change, diaphoresis and fever.  Respiratory: Negative for cough and shortness of breath.   Cardiovascular: Negative for chest pain, palpitations and leg swelling.  Gastrointestinal: Negative for abdominal pain, constipation, diarrhea, nausea and vomiting.  Neurological: Negative for dizziness, weakness, light-headedness, numbness and headaches.    Social History  Substance Use Topics  . Smoking status: Former Research scientist (life sciences)  . Smokeless tobacco: Never Used     Comment: 35 years ago  . Alcohol use No   Objective:   BP 128/80 (BP Location: Right Arm, Patient Position: Sitting, Cuff Size: Normal)   Pulse 76   Temp 98.4 F (36.9 C) (Oral)   Resp 16   Wt 174 lb (78.9 kg)   BMI 29.87 kg/m    Physical Exam  Constitutional: She appears well-developed and well-nourished. No distress.  Neck: Normal range of motion. Neck supple. No JVD present. No tracheal deviation present. No thyromegaly present.  Cardiovascular: Normal rate, regular  rhythm and normal heart sounds.  Exam reveals no gallop and no friction rub.   No murmur heard. Pulmonary/Chest: Effort normal and breath sounds normal. No respiratory distress. She has no wheezes. She has no rales.  Musculoskeletal: She exhibits no edema.  Lymphadenopathy:    She has no cervical adenopathy.  Skin: She is not diaphoretic.  Vitals reviewed.     Assessment & Plan:     1. H/O ovarian cancer Will check labs as below and f/u pending results. - CBC w/Diff/Platelet - Comprehensive Metabolic Panel (CMET) - CA 125  2. Pure hypercholesterolemia Discontinued pravastatin on her own. Wanted to see if she could lower her numbers with changing her diet habits and increasing physical activity. Will check labs as below and f/u pending results. - CBC w/Diff/Platelet - Comprehensive Metabolic Panel (CMET) - Lipid Profile  3. Family history of diabetes mellitus Will check labs as below and f/u pending results. - HgB A1c  4. Disorder of magnesium metabolism Will check magnesium as below. If low may consider trying to  get her back with IV magnesium since that has worked best for her. - Comprehensive Metabolic Panel (CMET) - Magnesium  5. Overweight Discussed weight loss in detail. Advised patient to really focus on food diary and limiting to 1200-1300 calories. Discussed that even healthy food options in high portions will still make you gain weight. She voiced understanding and agreed that portions are most likely her problem. She does exercise and is getting 10,000-11,000 steps daily. She works part-time at [employer] and her boss is a Physiological scientist that she also gets exercise tips from as well. She is interested in hormone replacement in the future to see if that helps her lose weight, but in the meantime is interested in starting a weight loss supplement. She does not wish to use a stimulant based appetite suppressant, thus we will start Contrave as below. I will see her back in 4  weeks to recheck her progress and make sure she is tolerating the medication.  - Naltrexone-Bupropion HCl ER 8-90 MG TB12; Take 1 tablet by mouth 2 (two) times daily.  Dispense: 60 tablet; Refill: 0  6. BMI 29.0-29.9,adult See above medical treatment plan. - Naltrexone-Bupropion HCl ER 8-90 MG TB12; Take 1 tablet by mouth 2 (two) times daily.  Dispense: 60 tablet; Refill: 0       Mar Daring, PA-C  [facility]

## 2017-03-01 LAB — CBC WITH DIFFERENTIAL/PLATELET
Basophils Absolute: 0 10*3/uL (ref 0.0–0.2)
Basos: 1 %
EOS (ABSOLUTE): 0.2 10*3/uL (ref 0.0–0.4)
Eos: 2 %
Hematocrit: 46.4 % (ref 34.0–46.6)
Hemoglobin: 15.7 g/dL (ref 11.1–15.9)
Immature Grans (Abs): 0 10*3/uL (ref 0.0–0.1)
Immature Granulocytes: 0 %
Lymphocytes Absolute: 2.7 10*3/uL (ref 0.7–3.1)
Lymphs: 33 %
MCH: 29.3 pg (ref 26.6–33.0)
MCHC: 33.8 g/dL (ref 31.5–35.7)
MCV: 87 fL (ref 79–97)
Monocytes Absolute: 0.6 10*3/uL (ref 0.1–0.9)
Monocytes: 7 %
Neutrophils Absolute: 4.8 10*3/uL (ref 1.4–7.0)
Neutrophils: 57 %
Platelets: 231 10*3/uL (ref 150–379)
RBC: 5.35 x10E6/uL — ABNORMAL HIGH (ref 3.77–5.28)
RDW: 13.4 % (ref 12.3–15.4)
WBC: 8.3 10*3/uL (ref 3.4–10.8)

## 2017-03-01 LAB — COMPREHENSIVE METABOLIC PANEL
ALT: 20 IU/L (ref 0–32)
AST: 17 IU/L (ref 0–40)
Albumin/Globulin Ratio: 1.7 (ref 1.2–2.2)
Albumin: 4.6 g/dL (ref 3.5–5.5)
Alkaline Phosphatase: 74 IU/L (ref 39–117)
BUN/Creatinine Ratio: 28 — ABNORMAL HIGH (ref 9–23)
BUN: 22 mg/dL (ref 6–24)
Bilirubin Total: 0.3 mg/dL (ref 0.0–1.2)
CO2: 28 mmol/L (ref 18–29)
Calcium: 9.9 mg/dL (ref 8.7–10.2)
Chloride: 98 mmol/L (ref 96–106)
Creatinine, Ser: 0.78 mg/dL (ref 0.57–1.00)
GFR calc Af Amer: 97 mL/min/{1.73_m2} (ref >59)
GFR calc non Af Amer: 84 mL/min/{1.73_m2} (ref >59)
Globulin, Total: 2.7 g/dL (ref 1.5–4.5)
Glucose: 101 mg/dL — ABNORMAL HIGH (ref 65–99)
Potassium: 4.7 mmol/L (ref 3.5–5.2)
Sodium: 141 mmol/L (ref 134–144)
Total Protein: 7.3 g/dL (ref 6.0–8.5)

## 2017-03-01 LAB — LIPID PANEL
Chol/HDL Ratio: 3.2 ratio (ref 0.0–4.4)
Cholesterol, Total: 206 mg/dL — ABNORMAL HIGH (ref 100–199)
HDL: 64 mg/dL (ref >39)
LDL Calculated: 126 mg/dL — ABNORMAL HIGH (ref 0–99)
Triglycerides: 80 mg/dL (ref 0–149)
VLDL Cholesterol Cal: 16 mg/dL (ref 5–40)

## 2017-03-01 LAB — HEMOGLOBIN A1C
Est. average glucose Bld gHb Est-mCnc: 117 mg/dL
Hgb A1c MFr Bld: 5.7 % — ABNORMAL HIGH (ref 4.8–5.6)

## 2017-03-01 LAB — CA 125: CA 125: 8.5 U/mL (ref 0.0–38.1)

## 2017-03-01 LAB — MAGNESIUM: Magnesium: 1.7 mg/dL (ref 1.6–2.3)

## 2017-03-06 ENCOUNTER — Telehealth: Payer: Self-pay

## 2017-03-06 NOTE — Telephone Encounter (Signed)
LMTCB  Reason of call: Is to let the patient know that Tawanna Sat directions for the Contrave is for patient to take 1 tab PO daily for 1 week  Then 1 tab PO BID until she finish the medication.She is due to come back and see Jenni around 05/02 and if patient tolerating will increase to 2 tabs PO BID at that time.  Thanks,  -Belmont Valli

## 2017-03-07 NOTE — Telephone Encounter (Signed)
Patient was advised as directed below. Patient had not started medication. She was scheduled for 05/02 to follow-up but re-scheduled patient for 05/16 at 4:30pm  Thanks,  -Emmette Katt

## 2017-03-10 ENCOUNTER — Telehealth: Payer: Self-pay | Admitting: Physician Assistant

## 2017-03-10 NOTE — Telephone Encounter (Signed)
Please advise. Joyce Decker, CMA  

## 2017-03-10 NOTE — Telephone Encounter (Signed)
Pt is requesting a call back to discuss the IV Magnesium that she discussed with Tawanna Sat in her last visit.  CB#[phone]/MW

## 2017-03-10 NOTE — Telephone Encounter (Signed)
Unfortunately her magnesium is in normal range at this time and will not be covered for IV supplementation.

## 2017-03-13 NOTE — Telephone Encounter (Signed)
LMTCB ED 

## 2017-03-16 NOTE — Telephone Encounter (Signed)
LMTCB ED 

## 2017-03-16 NOTE — Telephone Encounter (Signed)
Advised  ED 

## 2017-03-29 ENCOUNTER — Ambulatory Visit: Payer: BC Managed Care – PPO | Admitting: Physician Assistant

## 2017-03-29 ENCOUNTER — Telehealth: Payer: Self-pay | Admitting: Physician Assistant

## 2017-03-29 NOTE — Telephone Encounter (Signed)
It is possible to be causing constipation. She needs to push fluids and may need to use magnesium citrate to cleanse. She should probably decrease the fiber as it may be bulking her up more than she needs. Increase miralax to twice daily after using the magnesium citrate. Call if still no improvement after changes.

## 2017-03-29 NOTE — Telephone Encounter (Signed)
Pt states she is having constipation.  Pt feels like this is from the weight loss medication she is taking.  Pt states she is taking 4 fiber capsule, 3 stool softeners and a glass of miralax everyday but she is only having a bowel movement every 4 days.  Pt is having pain and some bleeding when having a bowel movement.  Pt is asking if there is a Rx that will help with this.  [facility].  [phone]

## 2017-03-29 NOTE — Telephone Encounter (Signed)
Please Review.  Thanks,  -Sherine Cortese 

## 2017-03-30 NOTE — Telephone Encounter (Signed)
lmtcb Josee Speece Drozdowski, CMA  

## 2017-03-30 NOTE — Telephone Encounter (Signed)
Pt advised and agrees with treatment plan. Joyce Decker, CMA  

## 2017-04-12 ENCOUNTER — Ambulatory Visit: Payer: Self-pay | Admitting: Physician Assistant

## 2017-04-12 ENCOUNTER — Telehealth: Payer: Self-pay | Admitting: Physician Assistant

## 2017-04-12 NOTE — Telephone Encounter (Signed)
LMTCB

## 2017-04-12 NOTE — Telephone Encounter (Signed)
It very well could be. Make sure she is staying well hydrated. May need to cut down to one tablet daily to see if symptoms improve.

## 2017-04-12 NOTE — Telephone Encounter (Signed)
Pt states she is having dizziness and nausea that started Monday morning.  Pt states she started a new Rx for weight loss several weeks ago and is asking if this is coming from the new medication.  Pt states she had the same symptoms several years ago but they just went away. Pt also states she is exercising and not sure if this has anything to do with the dizziness and nausea.  CB#[phone]/MW

## 2017-04-12 NOTE — Telephone Encounter (Signed)
Please review.  Thanks,  -Cherith Tewell 

## 2017-04-13 NOTE — Telephone Encounter (Signed)
LMTCB

## 2017-04-14 NOTE — Telephone Encounter (Signed)
LMTCB

## 2017-04-20 ENCOUNTER — Other Ambulatory Visit: Payer: Self-pay | Admitting: Physician Assistant

## 2017-04-20 DIAGNOSIS — E663 Overweight: Secondary | ICD-10-CM

## 2017-04-20 DIAGNOSIS — Z6829 Body mass index (BMI) 29.0-29.9, adult: Secondary | ICD-10-CM

## 2017-04-20 NOTE — Telephone Encounter (Signed)
Pharmacy request refill. This is Joyce Decker's patient.

## 2017-04-21 NOTE — Telephone Encounter (Signed)
Patient states her bottle says Contrave so she is thinking it is brand she is not sure. Also while I was talking to the patient she noticed the bottle said may cause dizziness and she started this medication in April and she started having dizzy spells second week of May and wonders if this medication causing this. She is still having dizzy spells with certain movements of her head. I advised patient that I will see if another provider can answer this for jennifer Burnette.-aa

## 2017-04-21 NOTE — Telephone Encounter (Signed)
[email or web address] 

## 2017-04-21 NOTE — Telephone Encounter (Signed)
This is brand name but looks like she's on generic. Does it need to be refiled as generic or do they substitute as needed?

## 2017-04-25 MED ORDER — NALTREXONE-BUPROPION HCL ER 8-90 MG PO TB12: 1.0000 | ORAL_TABLET | Freq: Two times a day (BID) | ORAL | 0 refills | Status: DC

## 2017-04-25 NOTE — Telephone Encounter (Signed)
Yes it is possible that this can cause dizziness. Patient requires a weight check before more refills if she wishes to continue.

## 2017-04-25 NOTE — Telephone Encounter (Signed)
Sent in Rx

## 2017-04-25 NOTE — Telephone Encounter (Signed)
Can you send this to [facility]? Never seen this patient.

## 2017-04-25 NOTE — Telephone Encounter (Signed)
Pt has an appointment on 04/27/2017, and is would like a refill before her appointment so she does not gain weight while waiting on her appointment. She states the dizziness is caused by crystals, as well as possibly being caused by cervical DDD. Please review. Renaldo Fiddler, CMA

## 2017-04-25 NOTE — Telephone Encounter (Signed)
Pt advised this was refilled. Renaldo Fiddler, CMA

## 2017-04-27 ENCOUNTER — Ambulatory Visit (INDEPENDENT_AMBULATORY_CARE_PROVIDER_SITE_OTHER): Payer: BC Managed Care – PPO | Admitting: Physician Assistant

## 2017-04-27 ENCOUNTER — Encounter: Payer: Self-pay | Admitting: Physician Assistant

## 2017-04-27 DIAGNOSIS — E663 Overweight: Secondary | ICD-10-CM

## 2017-04-27 DIAGNOSIS — Z6829 Body mass index (BMI) 29.0-29.9, adult: Secondary | ICD-10-CM | POA: Diagnosis not present

## 2017-04-27 MED ORDER — NALTREXONE-BUPROPION HCL ER 8-90 MG PO TB12: 2.0000 | ORAL_TABLET | Freq: Two times a day (BID) | ORAL | 0 refills | Status: DC

## 2017-04-27 NOTE — Progress Notes (Signed)
Patient: Joyce Decker Female    DOB: 1956-01-14   61 y.o.   MRN: [id number] Visit Date: 04/27/2017  Today's Provider: Mar Daring, PA-C   Chief Complaint  Patient presents with  . Weight Loss   Subjective:    HPI     Follow up for Weight Loss  The patient was last seen for this on 02/28/2017. Changes made at last visit include adding Contrave.  She reports excellent compliance with treatment. She feels that condition is Improved. Pt has lost 8 pounds since LOV. She is having side effects. Constipation.  Pt has an allowance of 1324 kcal/day. She is working part time at [employer], and she is making an effort to walk her dog more. ------------------------------------------------------------------------------------   Allergies  Allergen Reactions  . Other Other (See Comments)    Wheat barley and Grain unknown allergy, possible cause headache.  . Tape Other (See Comments)    Blisters  . Gadobenate Nausea Only    PT became nauseous with contrast, but did not vomit.    . Iodinated Diagnostic Agents Nausea Only     Current Outpatient Prescriptions:  .  BIOTIN PO, Take by mouth., Disp: , Rfl:  .  calcium-vitamin D (OSCAL WITH D) 500-200 MG-UNIT per tablet, Take 1 tablet by mouth., Disp: , Rfl:  .  Cinnamon Bark POWD, by Does not apply route., Disp: , Rfl:  .  CRANBERRY PO, Take 1 capsule by mouth daily., Disp: , Rfl:  .  Melatonin 5 MG CAPS, Take by mouth., Disp: , Rfl:  .  Multiple Vitamin (MULTIVITAMIN) capsule, Take 1 capsule by mouth daily., Disp: , Rfl:  .  Naltrexone-Bupropion HCl ER 8-90 MG TB12, Take 1 tablet by mouth 2 (two) times daily., Disp: 60 tablet, Rfl: 0 .  Alpha-Lipoic Acid 200 MG CAPS, Take by mouth., Disp: , Rfl:  .  magnesium oxide (MAG-OX) 400 MG tablet, Take 400 mg by mouth daily., Disp: , Rfl:   Review of Systems  Constitutional: Negative for activity change, chills, diaphoresis, fatigue, fever and unexpected weight change.    Respiratory: Negative for shortness of breath.   Cardiovascular: Negative for chest pain, palpitations and leg swelling.  Gastrointestinal: Positive for constipation (improving with OTC treatments).  Psychiatric/Behavioral: Negative.     Social History  Substance Use Topics  . Smoking status: Former Research scientist (life sciences)  . Smokeless tobacco: Never Used     Comment: 35 years ago  . Alcohol use No   Objective:   BP 128/82 (BP Location: Left Arm, Patient Position: Sitting, Cuff Size: Normal)   Pulse 88   Temp 97.6 F (36.4 C) (Oral)   Resp 16   Wt 166 lb (75.3 kg)   BMI 28.49 kg/m  Vitals:   04/27/17 1644  BP: 128/82  Pulse: 88  Resp: 16  Temp: 97.6 F (36.4 C)  TempSrc: Oral  Weight: 166 lb (75.3 kg)     Physical Exam  Constitutional: She appears well-developed and well-nourished. No distress.  Neck: Normal range of motion. Neck supple.  Cardiovascular: Normal rate, regular rhythm and normal heart sounds.  Exam reveals no gallop and no friction rub.   No murmur heard. Pulmonary/Chest: Effort normal and breath sounds normal. No respiratory distress. She has no wheezes. She has no rales.  Skin: She is not diaphoretic.  Psychiatric: She has a normal mood and affect. Her behavior is normal. Judgment and thought content normal.  Vitals reviewed.  Assessment & Plan:     1. Overweight Patient is doing well with her weight loss. Will continue contrave as below. I will see her back in 4 weeks to recheck.  - Naltrexone-Bupropion HCl ER 8-90 MG TB12; Take 2 tablets by mouth 2 (two) times daily.  Dispense: 120 tablet; Refill: 0  2. BMI 29.0-29.9,adult See above medical treatment plan. - Naltrexone-Bupropion HCl ER 8-90 MG TB12; Take 2 tablets by mouth 2 (two) times daily.  Dispense: 120 tablet; Refill: 0       Mar Daring, PA-C  [facility]

## 2017-04-27 NOTE — Patient Instructions (Addendum)
Brandt-Daroff Maneuver for [facility]  Calorie Counting for Weight Loss Calories are units of energy. Your body needs a certain amount of calories from food to keep you going throughout the day. When you eat more calories than your body needs, your body stores the extra calories as fat. When you eat fewer calories than your body needs, your body burns fat to get the energy it needs. Calorie counting means keeping track of how many calories you eat and drink each day. Calorie counting can be helpful if you need to lose weight. If you make sure to eat fewer calories than your body needs, you should lose weight. Ask your health care provider what a healthy weight is for you. For calorie counting to work, you will need to eat the right number of calories in a day in order to lose a healthy amount of weight per week. A dietitian can help you determine how many calories you need in a day and will give you suggestions on how to reach your calorie goal.  A healthy amount of weight to lose per week is usually 1-2 lb (0.5-0.9 kg). This usually means that your daily calorie intake should be reduced by 500-750 calories.  Eating 1,200 - 1,500 calories per day can help most women lose weight.  Eating 1,500 - 1,800 calories per day can help most men lose weight.  What is my plan? My goal is to have 1300calories per day. If I have this many calories per day, I should lose around 1-2 pounds per week. What do I need to know about calorie counting? In order to meet your daily calorie goal, you will need to:  Find out how many calories are in each food you would like to eat. Try to do this before you eat.  Decide how much of the food you plan to eat.  Write down what you ate and how many calories it had. Doing this is called keeping a food log.  To successfully lose weight, it is important to balance calorie counting with a healthy lifestyle that includes regular activity. Aim for 150 minutes of moderate exercise  (such as walking) or 75 minutes of vigorous exercise (such as running) each week. Where do I find calorie information?  The number of calories in a food can be found on a Nutrition Facts label. If a food does not have a Nutrition Facts label, try to look up the calories online or ask your dietitian for help. Remember that calories are listed per serving. If you choose to have more than one serving of a food, you will have to multiply the calories per serving by the amount of servings you plan to eat. For example, the label on a package of bread might say that a serving size is 1 slice and that there are 90 calories in a serving. If you eat 1 slice, you will have eaten 90 calories. If you eat 2 slices, you will have eaten 180 calories. How do I keep a food log? Immediately after each meal, record the following information in your food log:  What you ate. Don't forget to include toppings, sauces, and other extras on the food.  How much you ate. This can be measured in cups, ounces, or number of items.  How many calories each food and drink had.  The total number of calories in the meal.  Keep your food log near you, such as in a small notebook in your pocket, or use a mobile  app or website. Some programs will calculate calories for you and show you how many calories you have left for the day to meet your goal. What are some calorie counting tips?  Use your calories on foods and drinks that will fill you up and not leave you hungry: ? Some examples of foods that fill you up are nuts and nut butters, vegetables, lean proteins, and high-fiber foods like whole grains. High-fiber foods are foods with more than 5 g fiber per serving. ? Drinks such as sodas, specialty coffee drinks, alcohol, and juices have a lot of calories, yet do not fill you up.  Eat nutritious foods and avoid empty calories. Empty calories are calories you get from foods or beverages that do not have many vitamins or protein, such  as candy, sweets, and soda. It is better to have a nutritious high-calorie food (such as an avocado) than a food with few nutrients (such as a bag of chips).  Know how many calories are in the foods you eat most often. This will help you calculate calorie counts faster.  Pay attention to calories in drinks. Low-calorie drinks include water and unsweetened drinks.  Pay attention to nutrition labels for "low fat" or "fat free" foods. These foods sometimes have the same amount of calories or more calories than the full fat versions. They also often have added sugar, starch, or salt, to make up for flavor that was removed with the fat.  Find a way of tracking calories that works for you. Get creative. Try different apps or programs if writing down calories does not work for you. What are some portion control tips?  Know how many calories are in a serving. This will help you know how many servings of a certain food you can have.  Use a measuring cup to measure serving sizes. You could also try weighing out portions on a kitchen scale. With time, you will be able to estimate serving sizes for some foods.  Take some time to put servings of different foods on your favorite plates, bowls, and cups so you know what a serving looks like.  Try not to eat straight from a bag or box. Doing this can lead to overeating. Put the amount you would like to eat in a cup or on a plate to make sure you are eating the right portion.  Use smaller plates, glasses, and bowls to prevent overeating.  Try not to multitask (for example, watch TV or use your computer) while eating. If it is time to eat, sit down at a table and enjoy your food. This will help you to know when you are full. It will also help you to be aware of what you are eating and how much you are eating. What are tips for following this plan? Reading food labels  Check the calorie count compared to the serving size. The serving size may be smaller than  what you are used to eating.  Check the source of the calories. Make sure the food you are eating is high in vitamins and protein and low in saturated and trans fats. Shopping  Read nutrition labels while you shop. This will help you make healthy decisions before you decide to purchase your food.  Make a grocery list and stick to it. Cooking  Try to cook your favorite foods in a healthier way. For example, try baking instead of frying.  Use low-fat dairy products. Meal planning  Use more fruits and vegetables. Half  of your plate should be fruits and vegetables.  Include lean proteins like poultry and fish. How do I count calories when eating out?  Ask for smaller portion sizes.  Consider sharing an entree and sides instead of getting your own entree.  If you get your own entree, eat only half. Ask for a box at the beginning of your meal and put the rest of your entree in it so you are not tempted to eat it.  If calories are listed on the menu, choose the lower calorie options.  Choose dishes that include vegetables, fruits, whole grains, low-fat dairy products, and lean protein.  Choose items that are boiled, broiled, grilled, or steamed. Stay away from items that are buttered, battered, fried, or served with cream sauce. Items labeled "crispy" are usually fried, unless stated otherwise.  Choose water, low-fat milk, unsweetened iced tea, or other drinks without added sugar. If you want an alcoholic beverage, choose a lower calorie option such as a glass of wine or light beer.  Ask for dressings, sauces, and syrups on the side. These are usually high in calories, so you should limit the amount you eat.  If you want a salad, choose a garden salad and ask for grilled meats. Avoid extra toppings like bacon, cheese, or fried items. Ask for the dressing on the side, or ask for olive oil and vinegar or lemon to use as dressing.  Estimate how many servings of a food you are given. For  example, a serving of cooked rice is  cup or about the size of half a baseball. Knowing serving sizes will help you be aware of how much food you are eating at restaurants. The list below tells you how big or small some common portion sizes are based on everyday objects: ? 1 oz-4 stacked dice. ? 3 oz-1 deck of cards. ? 1 tsp-1 die. ? 1 Tbsp- a ping-pong ball. ? 2 Tbsp-1 ping-pong ball. ?  cup- baseball. ? 1 cup-1 baseball. Summary  Calorie counting means keeping track of how many calories you eat and drink each day. If you eat fewer calories than your body needs, you should lose weight.  A healthy amount of weight to lose per week is usually 1-2 lb (0.5-0.9 kg). This usually means reducing your daily calorie intake by 500-750 calories.  The number of calories in a food can be found on a Nutrition Facts label. If a food does not have a Nutrition Facts label, try to look up the calories online or ask your dietitian for help.  Use your calories on foods and drinks that will fill you up, and not on foods and drinks that will leave you hungry.  Use smaller plates, glasses, and bowls to prevent overeating. This information is not intended to replace advice given to you by your health care provider. Make sure you discuss any questions you have with your health care provider. Document Released: 11/14/2005 Document Revised: 10/14/2016 Document Reviewed: 10/14/2016 Elsevier Interactive Patient Education  2017 [employer].

## 2017-05-13 ENCOUNTER — Other Ambulatory Visit: Payer: Self-pay | Admitting: Physician Assistant

## 2017-05-13 DIAGNOSIS — E663 Overweight: Secondary | ICD-10-CM

## 2017-05-13 DIAGNOSIS — Z6829 Body mass index (BMI) 29.0-29.9, adult: Secondary | ICD-10-CM

## 2017-05-25 ENCOUNTER — Encounter: Payer: Self-pay | Admitting: Physician Assistant

## 2017-05-25 ENCOUNTER — Ambulatory Visit (INDEPENDENT_AMBULATORY_CARE_PROVIDER_SITE_OTHER): Payer: BC Managed Care – PPO | Admitting: Physician Assistant

## 2017-05-25 VITALS — BP 126/82 | HR 76 | Temp 98.2°F | Wt 157.4 lb

## 2017-05-25 DIAGNOSIS — Z6827 Body mass index (BMI) 27.0-27.9, adult: Secondary | ICD-10-CM

## 2017-05-25 DIAGNOSIS — Z713 Dietary counseling and surveillance: Secondary | ICD-10-CM

## 2017-05-25 NOTE — Patient Instructions (Signed)
Bupropion; Naltrexone extended-release tablets What is this medicine? BUPROPION; NALTREXONE (byoo PROE pee on; nal TREX one) is a combination product used to promote and maintain weight loss in obese adults or overweight adults who also have weight related medical problems. This medicine should be used with a reduced calorie diet and increased physical activity. This medicine may be used for other purposes; ask your health care provider or pharmacist if you have questions. COMMON BRAND NAME(S): CONTRAVE What should I tell my health care provider before I take this medicine? They need to know if you have any of these conditions: -an eating disorder, such as anorexia or bulimia -bipolar disorder -diabetes -depression -drug abuse or addiction -glaucoma -head injury -heart disease -high blood pressure -history of a tumor or infection of your brain or spine -history of stroke -history of irregular heartbeat -if you often drink alcohol -kidney disease -liver disease -schizophrenia -seizures -suicidal thoughts, plans, or attempt; a previous suicide attempt by you or a family member -an unusual or allergic reaction to bupropion, naltrexone, other medicines, foods, dyes, or preservatives -breast-feeding -pregnant or trying to become pregnant How should I use this medicine? Take this medicine by mouth with a glass of water. Follow the directions on the prescription label. Take this medicine in the morning and in the evenings as directed by your healthcare professional. You can take it with or without food. Do not take with high-fat meals as this may increase your risk of seizures. Do not crush, chew, or cut these tablets. Do not take your medicine more often than directed. Do not stop taking this medicine suddenly except upon the advice of your doctor. A special MedGuide will be given to you by the pharmacist with each prescription and refill. Be sure to read this information carefully each  time. Talk to your pediatrician regarding the use of this medicine in children. Special care may be needed. Overdosage: If you think you have taken too much of this medicine contact a poison control center or emergency room at once. NOTE: This medicine is only for you. Do not share this medicine with others. What if I miss a dose? If you miss a dose, skip the missed dose and take your next tablet at the regular time. Do not take double or extra doses. What may interact with this medicine? Do not take this medicine with any of the following medications: -any prescription or street opioid drug like codeine, heroin, methadone -linezolid -MAOIs like Carbex, Eldepryl, Marplan, Nardil, and Parnate -methylene blue (injected into a vein) -other medicines that contain bupropion like Zyban or Wellbutrin This medicine may also interact with the following medications: -alcohol -certain medicines for anxiety or sleep -certain medicines for blood pressure like metoprolol, propranolol -certain medicines for depression or psychotic disturbances -certain medicines for HIV or AIDS like efavirenz, lopinavir, nelfinavir, ritonavir -certain medicines for irregular heart beat like propafenone, flecainide -certain medicines for Parkinson's disease like amantadine, levodopa -certain medicines for seizures like carbamazepine, phenytoin, phenobarbital -cimetidine -clopidogrel -cyclophosphamide -digoxin -disulfiram -furazolidone -isoniazid -nicotine -orphenadrine -procarbazine -steroid medicines like prednisone or cortisone -stimulant medicines for attention disorders, weight loss, or to stay awake -tamoxifen -theophylline -thioridazine -thiotepa -ticlopidine -tramadol -warfarin This list may not describe all possible interactions. Give your health care provider a list of all the medicines, herbs, non-prescription drugs, or dietary supplements you use. Also tell them if you smoke, drink alcohol, or use  illegal drugs. Some items may interact with your medicine. What should I watch for while using this   medicine? This medicine is intended to be used in addition to a healthy diet and appropriate exercise. The best results are achieved this way. Do not increase or in any way change your dose without consulting your doctor or health care professional. Do not take this medicine with other prescription or over-the-counter weight loss products without consulting your doctor or health care professional. Your doctor should tell you to stop taking this medicine if you do not lose a certain amount of weight within the first 12 weeks of treatment. Visit your doctor or health care professional for regular checkups. Your doctor may order blood tests or other tests to see how you are doing. This medicine may affect blood sugar levels. If you have diabetes, check with your doctor or health care professional before you change your diet or the dose of your diabetic medicine. Patients and their families should watch out for new or worsening depression or thoughts of suicide. Also watch out for sudden changes in feelings such as feeling anxious, agitated, panicky, irritable, hostile, aggressive, impulsive, severely restless, overly excited and hyperactive, or not being able to sleep. If this happens, especially at the beginning of treatment or after a change in dose, call your health care professional. Avoid alcoholic drinks while taking this medicine. Drinking large amounts of alcoholic beverages, using sleeping or anxiety medicines, or quickly stopping the use of these agents while taking this medicine may increase your risk for a seizure. What side effects may I notice from receiving this medicine? Side effects that you should report to your doctor or health care professional as soon as possible: -allergic reactions like skin rash, itching or hives, swelling of the face, lips, or tongue -breathing problems -changes in  vision -confusion -elevated mood, decreased need for sleep, racing thoughts, impulsive behavior -fast or irregular heartbeat -hallucinations, loss of contact with reality -increased blood pressure -redness, blistering, peeling or loosening of the skin, including inside the mouth -seizures -signs and symptoms of liver injury like dark yellow or brown urine; general ill feeling or flu-like symptoms; light-colored stools; loss of appetite; nausea; right upper belly pain; unusually weak or tired; yellowing of the eyes or skin -suicidal thoughts or other mood changes -vomiting Side effects that usually do not require medical attention (report to your doctor or health care professional if they continue or are bothersome): -constipation -headache -loss of appetite -indigestion, stomach upset -tremors This list may not describe all possible side effects. Call your doctor for medical advice about side effects. You may report side effects to FDA at 1-800-FDA-1088. Where should I keep my medicine? Keep out of the reach of children. Store at room temperature between 15 and 30 degrees C (59 and 86 degrees F). Throw away any unused medicine after the expiration date. NOTE: This sheet is a summary. It may not cover all possible information. If you have questions about this medicine, talk to your doctor, pharmacist, or health care provider.  2018 Elsevier/Gold Standard (2016-05-06 13:42:58)  

## 2017-05-25 NOTE — Progress Notes (Signed)
   Patient: Joyce Decker Female    DOB: Nov 08, 1956   61 y.o.   MRN: [id number] Visit Date: 05/26/2017  Today's Provider: Mar Daring, PA-C   Chief Complaint  Patient presents with  . Follow-up    Weight   Subjective:    HPI Patient is here today for 4 week follow-up weight loss counseling. Last weight was 166 lb. Today weight is 157.4 lbs.  Patient has an allowance of 1324 calories per day. She is working part time at [employer], and she is making an effort to walk her dog twice daily.     Previous Medications   ALPHA-LIPOIC ACID 200 MG CAPS    Take by mouth.   BIOTIN PO    Take by mouth.   CALCIUM-VITAMIN D (OSCAL WITH D) 500-200 MG-UNIT PER TABLET    Take 1 tablet by mouth.   CINNAMON BARK POWD    by Does not apply route.   CONTRAVE 8-90 MG TB12    TAKE 1 TABLET BY MOUTH TWICE A DAY   CRANBERRY PO    Take 1 capsule by mouth daily.   MAGNESIUM OXIDE (MAG-OX) 400 MG TABLET    Take 400 mg by mouth daily.   MELATONIN 5 MG CAPS    Take by mouth.   MULTIPLE VITAMIN (MULTIVITAMIN) CAPSULE    Take 1 capsule by mouth daily.    Review of Systems  Constitutional: Negative.   Respiratory: Negative.   Cardiovascular: Negative.   Gastrointestinal: Negative.   Psychiatric/Behavioral: Negative.     Social History  Substance Use Topics  . Smoking status: Former Research scientist (life sciences)  . Smokeless tobacco: Never Used     Comment: 35 years ago  . Alcohol use No   Objective:   BP 126/82 (BP Location: Right Arm, Patient Position: Sitting, Cuff Size: Normal)   Pulse 76   Temp 98.2 F (36.8 C) (Oral)   Wt 157 lb 6.4 oz (71.4 kg)   SpO2 98%   BMI 27.02 kg/m   Physical Exam  Constitutional: She appears well-developed and well-nourished. No distress.  Neck: Normal range of motion. Neck supple.  Cardiovascular: Normal rate, regular rhythm and normal heart sounds.  Exam reveals no gallop and no friction rub.   No murmur heard. Pulmonary/Chest: Effort normal and breath sounds normal. No  respiratory distress. She has no wheezes. She has no rales.  Skin: She is not diaphoretic.  Psychiatric: She has a normal mood and affect. Her behavior is normal. Judgment and thought content normal.  Vitals reviewed.      Assessment & Plan:     1. Encounter for weight loss counseling Patient continues to do well with her weight loss at this time. We will continue contrave as she is tolerating this well at this time. I will see her back in 3 months to see how she is continuing with her weight loss.  2. BMI 27.0-27.9,adult See above medical treatment plan.   Follow up: Return in about 3 months (around 08/25/2017) for weight.

## 2017-06-13 ENCOUNTER — Other Ambulatory Visit: Payer: Self-pay | Admitting: Physician Assistant

## 2017-06-13 DIAGNOSIS — E663 Overweight: Secondary | ICD-10-CM

## 2017-06-13 DIAGNOSIS — Z6829 Body mass index (BMI) 29.0-29.9, adult: Secondary | ICD-10-CM

## 2017-07-05 ENCOUNTER — Inpatient Hospital Stay: Payer: BC Managed Care – PPO | Attending: Obstetrics and Gynecology | Admitting: Obstetrics and Gynecology

## 2017-07-05 ENCOUNTER — Other Ambulatory Visit: Payer: Self-pay

## 2017-07-05 VITALS — BP 136/87 | HR 76 | Temp 98.4°F | Resp 18 | Ht 64.0 in | Wt 155.9 lb

## 2017-07-05 DIAGNOSIS — N368 Other specified disorders of urethra: Secondary | ICD-10-CM

## 2017-07-05 DIAGNOSIS — Z1501 Genetic susceptibility to malignant neoplasm of breast: Secondary | ICD-10-CM | POA: Diagnosis not present

## 2017-07-05 DIAGNOSIS — Z1502 Genetic susceptibility to malignant neoplasm of ovary: Secondary | ICD-10-CM

## 2017-07-05 DIAGNOSIS — Z1509 Genetic susceptibility to other malignant neoplasm: Secondary | ICD-10-CM | POA: Diagnosis not present

## 2017-07-05 DIAGNOSIS — Z8543 Personal history of malignant neoplasm of ovary: Secondary | ICD-10-CM | POA: Diagnosis not present

## 2017-07-05 DIAGNOSIS — C569 Malignant neoplasm of unspecified ovary: Secondary | ICD-10-CM

## 2017-07-05 NOTE — Progress Notes (Signed)
Gynecologic Oncology New Patient Visit   Self referral   Chief Concern: h/o ovarian cancer  Subjective:  Joyce Decker is a 61 y.o. female who is seen for h/o stage III ovarian cancer, BRCA 1 mutation.   She presents today for surveillance. She has no concerning symptoms. In the interim she has been diagnosed with asthma/bronchitis, and a thyroid nodule.    Lab Results  Component Value Date   CA125 8.5 02/28/2017    Gynecologic Oncology History  Joyce Decker is a pleasant G3P3 female who is seen for h/o stage III ovarian cancer (BRCA1 mutation associated). She was diagnosed and December 2006. She underwent cytoreductive surgery. Postoperatively she received adjuvant therapy with cis-platinum and paclitaxel, intraperitoneal and IV therapy, times six cycles. She had a complete remission. Her post treatment course was significant for prolonged hypomagnesemia. She has been NED since.  She was the last to follow up for several years. She was a referred to a gyn given her excellent survival.    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   Problem List: Patient Active Problem List   Diagnosis Date Noted  . 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 History of migraine headaches 04/09/2015  . HLD (hyperlipidemia) 04/09/2015  . Chronic interstitial cystitis 04/09/2015  . H/O ovarian cancer 06/08/2012  . H/O renal calculi 06/08/2012  . Urethral cyst 06/08/2012  . Carcinoma of ovary, stage 3 ([place]) 02/19/2010  . 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 Awareness of heartbeats 02/19/2010  . Apnea, sleep 02/19/2010  . Airway hyperreactivity 05/06/2008  . Family history of diabetes mellitus 05/06/2008  . Fam hx-ischem heart disease 05/06/2008    Past Medical History: She does have a history of gestational diabetes. Past Medical History:  Diagnosis Date  . Asthma    . BRCA1 gene mutation positive   . Cancer ([place])    ovarian cancer 2006  . Sleep apnea    CPAP    Past Surgical History: Past Surgical History:  Procedure Laterality Date  . ABDOMINAL HYSTERECTOMY Bilateral    AND BSO  . COLONOSCOPY WITH PROPOFOL N/A 07/03/2015   Procedure: COLONOSCOPY WITH PROPOFOL;  Surgeon: Josefine Class, MD;  Location: [facility] END[facility]COPY;  Service: Endoscopy;  Laterality: N/A;  . OOPHORECTOMY    . TONSILLECTOMY AND ADENOIDECTOMY      Past Gynecologic History:  See HPI  OB History: G0P0 OB History  No data available    Family History: Family History  Problem Relation Age of Onset  . Diabetes Mother   . Hypertension Mother   . Diabetes Maternal Aunt   . Heart disease Maternal Aunt   . Hypertension Maternal Aunt   . Diabetes Maternal Uncle   . Heart disease Maternal Uncle   . Hypertension Maternal Uncle   . Colon cancer Maternal Uncle   . Brain cancer Maternal Grandmother   . Diabetes Cousin   . Breast cancer Cousin   . Melanoma Other   . Stomach cancer Other   . Prostate cancer Maternal Uncle   . Prostate cancer Maternal Uncle   . Breast cancer Cousin   . Breast cancer Cousin   . Breast cancer Cousin     Social History:  Her spouse self-employed at a Electronics engineer. She is a Pharmacist, hospital. Social History   Social History  . Marital status: Married    Spouse name: N/A  . Number of children:  N/A  . Years of education: N/A   Occupational History  . Not on file.   Social History Main Topics  . Smoking status: Former Research scientist (life sciences)  . Smokeless tobacco: Never Used     Comment: 35 years ago  . Alcohol use No  . Drug use: No  . Sexual activity: Yes   Other Topics Concern  . Not on file   Social History Narrative  . No narrative on file    Allergies: Allergies  Allergen Reactions  . Other Other (See Comments)    Wheat barley and Grain unknown allergy, possible cause headache.  . Tape Other (See Comments)    Blisters  . Gadobenate  Nausea Only    PT became nauseous with contrast, but did not vomit.    . Iodinated Diagnostic Agents Nausea Only    Current Medications: Current Outpatient Prescriptions  Medication Sig Dispense Refill  . BIOTIN PO Take 1 tablet by mouth daily.     . calcium-vitamin D (OSCAL WITH D) 500-200 MG-UNIT per tablet Take 1 tablet by mouth.    . Cinnamon 500 MG capsule Take 500 mg by mouth daily.    Marland Kitchen CONTRAVE 8-90 MG TB12 TAKE 2 TABLETS BY MOUTH TWICE A DAY 120 tablet 2  . CRANBERRY PO Take 1 capsule by mouth daily.    . Melatonin 3 MG TABS Take 1 tablet by mouth at bedtime.    . Multiple Vitamin (MULTIVITAMIN) capsule Take 1 capsule by mouth daily.     No current facility-administered medications for this visit.     Review of Systems General: no complaints  HEENT: no complaints  Lungs: no complaints  Cardiac: no complaints  GI: no complaints  GU: no complaints  Musculoskeletal: no complaints  Extremities: no complaints  Skin: no complaints  Neuro: no complaints  Endocrine: no complaints  Psych: no complaints       Objective:  Physical Examination:  BP 136/87 Comment: down time procedure, entering after pt has been seen due to CHL-down  Pulse 76 Comment: down time procedure, entering after pt has been seen due to CHL-down  Temp 98.4 F (36.9 C) (Tympanic) Comment: down time procedure, entering after pt has been seen due to CHL-down  Resp 18 Comment: down time procedure, entering after pt has been seen due to CHL-down  Ht '5\' 4"'  (1.626 m) Comment: down time procedure, entering after pt has been seen due to CHL-down  Wt 155 lb 14.4 oz (70.7 kg) Comment: down time procedure, entering after pt has been seen due to CHL-down  BMI 26.76 kg/m    ECOG Performance Status: 0 - Asymptomatic   General appearance: alert, cooperative and appears stated age 33: ATNC Lymph node survey: non-palpable, axillary, inguinal, supraclavicular Cardiovascular: RRR Respiratory: B CTA. Abdomen:  SNDNT. Incision all well healed. No hernias, no masses, no ascites.  Extremities:No edema Skin exam - Well healed incisions.  Neurological exam reveals alert, oriented, normal speech, no focal findings or movement disorder noted  Pelvic: Chaperoned by RN Vulva: normal appearing vulva with no masses, tenderness or lesions; 1.5 cm periurethral cyst on the right;  Vagina: normal; Adnexa, Uterus and Cervix: surgically absent; BME negative for masses or nodularity.   Lab Review  Labs on site today: pending  Radiologic Imaging: n/a    Assessment:  Joyce Decker is a 61 y.o. female h/o stage III ovarian cancer, BRCA 1 mutation. Clinically NED   Medical co-morbidities complicating care: prior abdominal surgery, positive BRCA mutation carrier Plan:  Problem List Items Addressed This Visit      Other   H/O ovarian cancer - Primary    Other Visit Diagnoses    BRCA gene mutation positive in female       Periurethral cyst         Plan for Urology consult to evaluate periurethral cyst.   CA125 ordered. If negative follow up in one year.    I recommend she see Dr. Coralee Rud for discussion of other screening procedures and review risks of pancreatic cancer and other cancers. Her oldest son has been tested for BRCA mutations. I encouraged that her younger sons also be tested. If positive they may be interested in preimplantation genetics.    The patient's diagnosis, an outline of the further diagnostic and laboratory studies which will be required, the recommendation, and alternatives were discussed.  All questions were answered to the patient's satisfaction.    Gillis Ends, MD    CC:  PCP:  Dr. Margarita Rana  Mar Daring, PA-C [place], [place] [address] [phone]

## 2017-07-06 ENCOUNTER — Inpatient Hospital Stay: Payer: BC Managed Care – PPO

## 2017-07-06 DIAGNOSIS — Z8543 Personal history of malignant neoplasm of ovary: Secondary | ICD-10-CM | POA: Diagnosis not present

## 2017-07-06 DIAGNOSIS — C569 Malignant neoplasm of unspecified ovary: Secondary | ICD-10-CM

## 2017-07-06 DIAGNOSIS — N368 Other specified disorders of urethra: Secondary | ICD-10-CM

## 2017-07-07 LAB — CA 125: Cancer Antigen (CA) 125: 9 U/mL (ref 0.0–38.1)

## 2017-07-09 NOTE — Progress Notes (Signed)
Pt was suppose to see reg. Gyn once a year from when she was discharged from Dr. Sabra Heck from GYN oncology and she did not f/u.  She is restarting f/u with [facility] gyn oncology today.

## 2017-07-10 ENCOUNTER — Telehealth: Payer: Self-pay

## 2017-07-10 DIAGNOSIS — C569 Malignant neoplasm of unspecified ovary: Secondary | ICD-10-CM

## 2017-07-10 DIAGNOSIS — Z1501 Genetic susceptibility to malignant neoplasm of breast: Secondary | ICD-10-CM

## 2017-07-10 NOTE — Telephone Encounter (Signed)
  Oncology Nurse Navigator Documentation Notified of CA 125 results. Referral faxed to Dr. Lisbeth Ply for [facility] genetics.   Ref Range & Units 4d ago  Cancer Antigen (CA) 125 0.0 - 38.1 U/mL 9.0     Navigator Location: [facility] (07/10/17 1400)   )Navigator Encounter Type: Telephone;Diagnostic Results (07/10/17 1400)                                                    Time Spent with Patient: 15 (07/10/17 1400)

## 2017-07-19 ENCOUNTER — Encounter: Payer: Self-pay | Admitting: Physician Assistant

## 2017-08-01 ENCOUNTER — Ambulatory Visit: Payer: BC Managed Care – PPO | Admitting: Urology

## 2017-08-16 ENCOUNTER — Ambulatory Visit (INDEPENDENT_AMBULATORY_CARE_PROVIDER_SITE_OTHER): Payer: BC Managed Care – PPO | Admitting: Urology

## 2017-08-16 ENCOUNTER — Encounter: Payer: Self-pay | Admitting: Urology

## 2017-08-16 VITALS — BP 126/82 | HR 93 | Ht 64.0 in | Wt 150.0 lb

## 2017-08-16 DIAGNOSIS — N368 Other specified disorders of urethra: Secondary | ICD-10-CM

## 2017-08-16 NOTE — Progress Notes (Signed)
08/16/2017 11:38 AM   [name] [birth year] [id number]  Referring provider: Mar Daring, PA-C [place], [place] [address]  Chief Complaint  Patient presents with  . Urethral Cyst    HPI: 61 year old female who presents today for further evaluation of periurethral cyst. She is recently seen and evaluated by GYN oncologist, Dr. Theora Gianotti as part of her surveillance for history of stage III ovarian cancer, BRCA1 mutation.  During her pelvic examination, she was noted to have a periurethral cyst which appeared to be larger than previously documented by other providers.    She denies any pain or discomfort from the cyst.  She was told about the cyst in the past but is been otherwise asymptomatic.  No urethral discharge or perineal pain.  Prior to being seen by Dr. Theora Gianotti, her right-sided periurethral cyst measured 0.5 cm.  So had a cystoscopy without any evidence of urethral pathology.  The terms of cancer history, she was first diagnosed in December 2006 and is now status post cytoreductive surgery followed by adjuvant cis-platinum and passed the Taxol, intraperitoneal and IV therapy.  She remains in admission.  Takes cranberry tabs bid to prevent UTIs.    PMH: Past Medical History:  Diagnosis Date  . Asthma   . BRCA1 gene mutation positive   . Cancer ([place])    ovarian cancer 2006  . Hyperthyroidism   . Hypothyroidism   . Kidney stones   . Sleep apnea    CPAP    Surgical History: Past Surgical History:  Procedure Laterality Date  . ABDOMINAL HYSTERECTOMY Bilateral    AND BSO  . COLONOSCOPY WITH PROPOFOL N/A 07/03/2015   Procedure: COLONOSCOPY WITH PROPOFOL;  Surgeon: Josefine Class, MD;  Location: [facility] ENDOSCOPY;  Service: Endoscopy;  Laterality: N/A;  . Dental Implant    . OOPHORECTOMY    . TONSILLECTOMY AND ADENOIDECTOMY      Home Medications:  Allergies as of 08/16/2017      Reactions   Other Other (See Comments)   Wheat  barley and Grain unknown allergy, possible cause headache.   Tape Other (See Comments)   Blisters   Gadobenate Nausea Only   PT became nauseous with contrast, but did not vomit.     Iodinated Diagnostic Agents Nausea Only      Medication List       Accurate as of 08/16/17 11:59 PM. Always use your most recent med list.          BIOTIN PO Take 1 tablet by mouth daily.   calcium-vitamin D 500-200 MG-UNIT tablet Commonly known as:  OSCAL WITH D Take 1 tablet by mouth.   Cinnamon 500 MG capsule Take 500 mg by mouth daily.   CONTRAVE 8-90 MG Tb12 Generic drug:  Naltrexone-Bupropion HCl ER TAKE 2 TABLETS BY MOUTH TWICE A DAY   CRANBERRY PO Take 1 capsule by mouth daily.   Melatonin 3 MG Tabs Take 1 tablet by mouth at bedtime.   multivitamin capsule Take 1 capsule by mouth daily.       Allergies:  Allergies  Allergen Reactions  . Other Other (See Comments)    Wheat barley and Grain unknown allergy, possible cause headache.  . Tape Other (See Comments)    Blisters  . Gadobenate Nausea Only    PT became nauseous with contrast, but did not vomit.    . Iodinated Diagnostic Agents Nausea Only    Family History: Family History  Problem Relation Age of  Onset  . Diabetes Mother   . Hypertension Mother   . Diabetes Maternal Aunt   . Heart disease Maternal Aunt   . Hypertension Maternal Aunt   . Diabetes Maternal Uncle   . Heart disease Maternal Uncle   . Hypertension Maternal Uncle   . Colon cancer Maternal Uncle   . Brain cancer Maternal Grandmother   . Diabetes Cousin   . Breast cancer Cousin   . Melanoma Other   . Stomach cancer Other   . Prostate cancer Maternal Uncle   . Prostate cancer Maternal Uncle   . Breast cancer Cousin   . Breast cancer Cousin   . Breast cancer Cousin     Social History:  reports that she has quit smoking. She has never used smokeless tobacco. She reports that she does not drink alcohol or use drugs.  ROS: UROLOGY Frequent  Urination?: No Hard to postpone urination?: No Burning/pain with urination?: Yes Get up at night to urinate?: No Leakage of urine?: No Urine stream starts and stops?: Yes Trouble starting stream?: No Do you have to strain to urinate?: Yes Blood in urine?: Yes Urinary tract infection?: Yes Sexually transmitted disease?: No Injury to kidneys or bladder?: No Painful intercourse?: No Weak stream?: No Currently pregnant?: Yes Vaginal bleeding?: No Last menstrual period?: n  Gastrointestinal Nausea?: No Vomiting?: No Indigestion/heartburn?: No Diarrhea?: No Constipation?: Yes  Constitutional Fever: No Night sweats?: No Weight loss?: Yes Fatigue?: No  Skin Skin rash/lesions?: No Itching?: No  Eyes Blurred vision?: No Double vision?: No  Ears/Nose/Throat Sore throat?: No Sinus problems?: No  Hematologic/Lymphatic Swollen glands?: No Easy bruising?: No  Cardiovascular Leg swelling?: No Chest pain?: No  Respiratory Cough?: No Shortness of breath?: No  Endocrine Excessive thirst?: No  Musculoskeletal Back pain?: No Joint pain?: No  Neurological Headaches?: Yes Dizziness?: Yes  Psychologic Depression?: No Anxiety?: No  Physical Exam: BP 126/82 (BP Location: Left Arm, Patient Position: Sitting, Cuff Size: Normal)   Pulse 93   Ht 5' 4" (1.626 m)   Wt 150 lb (68 kg)   BMI 25.75 kg/m   Constitutional:  Alert and oriented, No acute distress. HEENT: [place] AT, moist mucus membranes.  Trachea midline, no masses. Cardiovascular: No clubbing, cyanosis, or edema. Respiratory: Normal respiratory effort, no increased work of breathing. GI: Abdomen is soft, nontender, nondistended, no abdominal masses GU/ pelvic: Normal external genitalia.  Atrophic vaginitis appreciated.  Normal urethral meatus with approximately 1.5 cm whitish colored periurethral cyst on the right appreciated.  No tenderness.  No urethral discharge with manipulation of the cyst. Skin: No  rashes, bruises or suspicious lesions. Lymph: No inguinal adenopathy. Neurologic: Grossly intact, no focal deficits, moving all 4 extremities. Psychiatric: Normal mood and affect.  Laboratory Data: Lab Results  Component Value Date   WBC 8.3 02/28/2017   HGB 15.7 02/28/2017   HCT 46.4 02/28/2017   MCV 87 02/28/2017   PLT 231 02/28/2017    Lab Results  Component Value Date   CREATININE 0.78 02/28/2017    Lab Results  Component Value Date   HGBA1C 5.7 (H) 02/28/2017    Urinalysis    Component Value Date/Time   APPEARANCEUR Clear 08/16/2017 1600   GLUCOSEU Negative 08/16/2017 1600   BILIRUBINUR Negative 08/16/2017 1600   PROTEINUR Negative 08/16/2017 1600   UROBILINOGEN 0.2 06/19/2015 1725   NITRITE Negative 08/16/2017 1600   LEUKOCYTESUR Negative 08/16/2017 1600    Pertinent Imaging: n/a  Assessment & Plan:    1. Urethral cyst Largely  asymptomatic right urethral cyst Likely benign, however given fairly rapid increase in size from 0.5 cm 2 years ago now to 1.5 cm, would recommend further evaluation Discussed pelvic MRI as imaging modality to rule out any significant or concerning underlying features Patient is agreeable this plan - Urinalysis, Complete - MR Pelvis W Contrast; Future  Return for will call with MR results.  Hollice Espy, MD  [facility] [address] [place], [place] [address] [phone]

## 2017-08-17 LAB — URINALYSIS, COMPLETE
Bilirubin, UA: NEGATIVE
Glucose, UA: NEGATIVE
Ketones, UA: NEGATIVE
Leukocytes, UA: NEGATIVE
Nitrite, UA: NEGATIVE
Protein, UA: NEGATIVE
Specific Gravity, UA: 1.01 (ref 1.005–1.030)
Urobilinogen, Ur: 0.2 mg/dL (ref 0.2–1.0)
pH, UA: 6 (ref 5.0–7.5)

## 2017-08-23 ENCOUNTER — Telehealth: Payer: Self-pay | Admitting: Urology

## 2017-08-23 DIAGNOSIS — N368 Other specified disorders of urethra: Secondary | ICD-10-CM

## 2017-08-23 NOTE — Telephone Encounter (Signed)
I got an email from scheduling and they are saying that this order needs to be changed to an MRI pelvis with and w/out? If so can you change this/   Sharyn Lull

## 2017-08-23 NOTE — Telephone Encounter (Signed)
done

## 2017-08-25 ENCOUNTER — Ambulatory Visit: Payer: BC Managed Care – PPO | Admitting: Physician Assistant

## 2017-08-30 ENCOUNTER — Ambulatory Visit: Payer: BC Managed Care – PPO | Admitting: Physician Assistant

## 2017-09-06 ENCOUNTER — Encounter: Payer: Self-pay | Admitting: Physician Assistant

## 2017-09-06 ENCOUNTER — Ambulatory Visit (INDEPENDENT_AMBULATORY_CARE_PROVIDER_SITE_OTHER): Payer: BC Managed Care – PPO | Admitting: Physician Assistant

## 2017-09-06 VITALS — BP 120/70 | HR 81 | Temp 98.7°F | Resp 16 | Wt 155.0 lb

## 2017-09-06 DIAGNOSIS — E663 Overweight: Secondary | ICD-10-CM | POA: Diagnosis not present

## 2017-09-06 DIAGNOSIS — Z713 Dietary counseling and surveillance: Secondary | ICD-10-CM | POA: Diagnosis not present

## 2017-09-06 DIAGNOSIS — Z6826 Body mass index (BMI) 26.0-26.9, adult: Secondary | ICD-10-CM | POA: Diagnosis not present

## 2017-09-06 DIAGNOSIS — Z23 Encounter for immunization: Secondary | ICD-10-CM | POA: Diagnosis not present

## 2017-09-06 MED ORDER — NALTREXONE-BUPROPION HCL ER 8-90 MG PO TB12: 2.0000 | ORAL_TABLET | Freq: Two times a day (BID) | ORAL | 2 refills | Status: DC

## 2017-09-06 NOTE — Progress Notes (Signed)
Patient: Joyce Decker Female    DOB: 23-Feb-1956   61 y.o.   MRN: [id number] Visit Date: 09/06/2017  Today's Provider: Mar Daring, PA-C   Chief Complaint  Patient presents with  . Follow-up    weight   Subjective:    HPI Patient is here today to follow-up weight.  Patient was seen 4 months ago. Last weight was 157.4lbs Patient has an allowance of 1324 calories per day. She reports that she was eating hospital food when her husband was at the hospital who is on the list for a liver transplant. She reports this is very stressful for her. She herself is scheduled to have a mastectomy for being BRCA positive and then reconstruction following. She is walking her dog twice a day with some exercises.   Wt Readings from Last 3 Encounters:  09/06/17 155 lb (70.3 kg)  08/16/17 150 lb (68 kg)  07/09/17 155 lb 14.4 oz (70.7 kg)      Allergies  Allergen Reactions  . Other Other (See Comments)    Wheat barley and Grain unknown allergy, possible cause headache.  . Tape Other (See Comments)    Blisters  . Gadobenate Nausea Only    PT became nauseous with contrast, but did not vomit.    . Iodinated Diagnostic Agents Nausea Only     Current Outpatient Prescriptions:  .  BIOTIN PO, Take 1 tablet by mouth daily. , Disp: , Rfl:  .  calcium-vitamin D (OSCAL WITH D) 500-200 MG-UNIT per tablet, Take 1 tablet by mouth., Disp: , Rfl:  .  Cinnamon 500 MG capsule, Take 500 mg by mouth daily., Disp: , Rfl:  .  CONTRAVE 8-90 MG TB12, TAKE 2 TABLETS BY MOUTH TWICE A DAY, Disp: 120 tablet, Rfl: 2 .  CRANBERRY PO, Take 1 capsule by mouth daily., Disp: , Rfl:  .  Melatonin 3 MG TABS, Take 1 tablet by mouth at bedtime., Disp: , Rfl:  .  Multiple Vitamin (MULTIVITAMIN) capsule, Take 1 capsule by mouth daily., Disp: , Rfl:   Review of Systems  Constitutional: Negative.   Respiratory: Negative.   Cardiovascular: Negative.   Gastrointestinal: Negative.   Psychiatric/Behavioral:  Negative.     Social History  Substance Use Topics  . Smoking status: Former Research scientist (life sciences)  . Smokeless tobacco: Never Used     Comment: 35 years ago  . Alcohol use No   Objective:   BP 120/70 (BP Location: Right Arm, Patient Position: Sitting, Cuff Size: Normal)   Pulse 81   Temp 98.7 F (37.1 C) (Oral)   Resp 16   Wt 155 lb (70.3 kg)   BMI 26.61 kg/m     Physical Exam  Constitutional: She appears well-developed and well-nourished. No distress.  Neck: Normal range of motion. Neck supple.  Cardiovascular: Normal rate, regular rhythm and normal heart sounds.  Exam reveals no gallop and no friction rub.   No murmur heard. Pulmonary/Chest: Effort normal and breath sounds normal. No respiratory distress. She has no wheezes. She has no rales.  Skin: She is not diaphoretic.  Psychiatric: She has a normal mood and affect. Her behavior is normal. Judgment and thought content normal.  Vitals reviewed.      Assessment & Plan:     1. BMI 26.0-26.9,adult Patient's weight loss has slowed some but she was still able to lose 2 pounds compared to previous weight. She reports her husband is home now and she is getting her eating  and exercise back on track. They are still awaiting a liver transplant for him and she is scheduled to have a bilateral prophylactic mastectomy due to being BRCA positive, which was found when she was found to have ovarian cancer. We will continue Contrave as below and I will see her back in 3 months for CPE.   2. Encounter for weight loss counseling See above medical treatment plan.  3. Overweight See above medical treatment plan. - Naltrexone-Bupropion HCl ER (CONTRAVE) 8-90 MG TB12; Take 2 tablets by mouth 2 (two) times daily.  Dispense: 120 tablet; Refill: 2  4. Need for influenza vaccination Flu vaccine given today without complication. Patient sat upright for 15 minutes to check for adverse reaction before being released. - Flu Vaccine QUAD 36+ mos IM        Mar Daring, PA-C  [facility]

## 2017-09-26 ENCOUNTER — Telehealth: Payer: Self-pay | Admitting: Urology

## 2017-09-26 NOTE — Telephone Encounter (Signed)
On 09-15-17 they mailed her a letter since she did not respond

## 2017-09-26 NOTE — Telephone Encounter (Signed)
They called her three times to schedule and she would not return the call to schedule that's why it never got done according to the notes on the order.  Joyce Decker

## 2017-10-30 ENCOUNTER — Telehealth: Payer: Self-pay | Admitting: Physician Assistant

## 2017-10-30 DIAGNOSIS — E663 Overweight: Secondary | ICD-10-CM

## 2017-10-30 NOTE — Telephone Encounter (Signed)
Pt called saying the pharmacy told her that after jan1 the insurance would no longer pay for Contrave.  She either needs to get the max she can get now or have it changed to use something else that insurance will pay for.  Her call back is [place]  Thanks teri

## 2017-10-31 MED ORDER — NALTREXONE-BUPROPION HCL ER 8-90 MG PO TB12: 2.0000 | ORAL_TABLET | Freq: Two times a day (BID) | ORAL | 0 refills | Status: DC

## 2017-10-31 NOTE — Telephone Encounter (Signed)
LM as directed below.  Thanks,  -Hanna Ra 

## 2017-10-31 NOTE — Telephone Encounter (Signed)
If patient desires can give her 3 more months now of contrave. Then can readdress if needed.   Contrave 3 month supply sent to [facility]

## 2018-10-06 ENCOUNTER — Other Ambulatory Visit: Payer: Self-pay | Admitting: Physician Assistant

## 2018-10-06 DIAGNOSIS — E663 Overweight: Secondary | ICD-10-CM

## 2018-10-08 ENCOUNTER — Encounter: Payer: Self-pay | Admitting: Physician Assistant

## 2018-10-08 ENCOUNTER — Ambulatory Visit (INDEPENDENT_AMBULATORY_CARE_PROVIDER_SITE_OTHER): Payer: BC Managed Care – PPO | Admitting: Physician Assistant

## 2018-10-08 VITALS — BP 138/88 | HR 80 | Temp 98.6°F | Resp 16 | Wt 165.0 lb

## 2018-10-08 DIAGNOSIS — E663 Overweight: Secondary | ICD-10-CM

## 2018-10-08 DIAGNOSIS — Z6828 Body mass index (BMI) 28.0-28.9, adult: Secondary | ICD-10-CM | POA: Diagnosis not present

## 2018-10-08 DIAGNOSIS — G4733 Obstructive sleep apnea (adult) (pediatric): Secondary | ICD-10-CM

## 2018-10-08 DIAGNOSIS — Z713 Dietary counseling and surveillance: Secondary | ICD-10-CM

## 2018-10-08 MED ORDER — NALTREXONE-BUPROPION HCL ER 8-90 MG PO TB12: 2.0000 | ORAL_TABLET | Freq: Two times a day (BID) | ORAL | 0 refills | Status: DC

## 2018-10-08 NOTE — Progress Notes (Signed)
Patient: Joyce Decker Female    DOB: May 29, 1956   62 y.o.   MRN: [id number] Visit Date: 10/08/2018  Today's Provider: Mar Daring, PA-C   Chief Complaint  Patient presents with  . Follow-up    Weight Loss Counseling   Subjective:    HPI Patient here today to discuss weight loss counseling. Patient was on the Contrave. She reports that she would like to get back on something to help with her weight. She reports that her diet overall is healthy. She reports that she maintains very active at work.  Patient reports that she also needs a cpap prescription.     Allergies  Allergen Reactions  . Other Other (See Comments)    Wheat barley and Grain unknown allergy, possible cause headache.  . Tape Other (See Comments)    Blisters  . Gadobenate Nausea Only    PT became nauseous with contrast, but did not vomit.    . Iodinated Diagnostic Agents Nausea Only     Current Outpatient Medications:  .  BIOTIN PO, Take 1 tablet by mouth daily. , Disp: , Rfl:  .  calcium-vitamin D (OSCAL WITH D) 500-200 MG-UNIT per tablet, Take 1 tablet by mouth., Disp: , Rfl:  .  Cinnamon 500 MG capsule, Take 500 mg by mouth daily., Disp: , Rfl:  .  CRANBERRY PO, Take 1 capsule by mouth daily., Disp: , Rfl:  .  Melatonin 3 MG TABS, Take 1 tablet by mouth at bedtime., Disp: , Rfl:  .  Multiple Vitamin (MULTIVITAMIN) capsule, Take 1 capsule by mouth daily., Disp: , Rfl:  .  CONTRAVE 8-90 MG TB12, TAKE 2 TABLETS BY MOUTH TWICE A DAY (Patient not taking: Reported on 10/08/2018), Disp: 360 tablet, Rfl: 0  Review of Systems  Constitutional: Negative.   Respiratory: Negative.   Cardiovascular: Negative.   Gastrointestinal: Negative.   Neurological: Negative.   Psychiatric/Behavioral: Negative.     Social History   Tobacco Use  . Smoking status: Former Research scientist (life sciences)  . Smokeless tobacco: Never Used  . Tobacco comment: 35 years ago  Substance Use Topics  . Alcohol use: No   Objective:   BP  138/88 (BP Location: Left Arm, Patient Position: Sitting, Cuff Size: Normal)   Pulse 80   Temp 98.6 F (37 C) (Oral)   Resp 16   Wt 165 lb (74.8 kg)   SpO2 98%   BMI 28.32 kg/m  Vitals:   10/08/18 1806  BP: 138/88  Pulse: 80  Resp: 16  Temp: 98.6 F (37 C)  TempSrc: Oral  SpO2: 98%  Weight: 165 lb (74.8 kg)     Physical Exam  Constitutional: She appears well-developed and well-nourished. No distress.  Neck: Normal range of motion. Neck supple.  Cardiovascular: Normal rate, regular rhythm and normal heart sounds. Exam reveals no gallop and no friction rub.  No murmur heard. Pulmonary/Chest: Effort normal and breath sounds normal. No respiratory distress. She has no wheezes. She has no rales.  Musculoskeletal: She exhibits no edema.  Skin: She is not diaphoretic.  Vitals reviewed.       Assessment & Plan:     1. BMI 28.0-28.9,adult Will try to see if Contrave will be covered again. She did well on this last year with assisting with weight loss. If not covered may consider Saxenda instead for medication assisted weight loss.  - Naltrexone-buPROPion HCl ER (CONTRAVE) 8-90 MG TB12; Take 2 tablets by mouth 2 (two) times daily.  Dispense: 360 tablet; Refill: 0  2. Weight loss counseling, encounter for See above medical treatment plan. - Naltrexone-buPROPion HCl ER (CONTRAVE) 8-90 MG TB12; Take 2 tablets by mouth 2 (two) times daily.  Dispense: 360 tablet; Refill: 0  3. Obstructive sleep apnea syndrome Doing well with CPAP. Uses nightly. Requesting new machine. Current machine 75-70 years old per patient. Written Rx was faxed to [facility], current supplier of CPAP supplies.   4. Overweight See above medical treatment plan for #1.  - Naltrexone-buPROPion HCl ER (CONTRAVE) 8-90 MG TB12; Take 2 tablets by mouth 2 (two) times daily.  Dispense: 360 tablet; Refill: 0       Mar Daring, PA-C  [facility]

## 2018-10-09 ENCOUNTER — Telehealth: Payer: Self-pay

## 2018-10-09 DIAGNOSIS — Z6828 Body mass index (BMI) 28.0-28.9, adult: Secondary | ICD-10-CM

## 2018-10-09 NOTE — Telephone Encounter (Signed)
Patient called office stating that last night Jenni prescribed Contrave for weight loss but when she went to pharmacy to pick up prescription she was told that she would have to pay out of  pocket $771, patient is wanting to know if diagnosis code was right when prescription was sent in? Patient states that you had mentioned a possible shot she could take for weight loss and wanted to know if that was still available as a option? KW

## 2018-10-10 NOTE — Telephone Encounter (Signed)
Please Review

## 2018-10-10 NOTE — Telephone Encounter (Signed)
The pharmacy should send [place] a PA or we can start one to see if Contrave can be covered.   If denied then will send in [place] as discussed.

## 2018-10-11 ENCOUNTER — Encounter: Payer: Self-pay | Admitting: Physician Assistant

## 2018-10-15 MED ORDER — LIRAGLUTIDE -WEIGHT MANAGEMENT 18 MG/3ML ~~LOC~~ SOPN: 0.6000 mg | PEN_INJECTOR | Freq: Every day | SUBCUTANEOUS | 3 refills | Status: DC

## 2018-10-15 NOTE — Telephone Encounter (Signed)
They preferred Saxenda for patient to tried.

## 2018-10-15 NOTE — Telephone Encounter (Signed)
Patient will need to call office to be instructed on how to take once she has the pen

## 2018-10-15 NOTE — Telephone Encounter (Signed)
PA for saxenda done

## 2018-10-16 NOTE — Telephone Encounter (Signed)
LM that that [place] was approved but not Contrave and that she needs to schedule an appointment to be instructed how to take the injection.

## 2019-01-30 ENCOUNTER — Ambulatory Visit: Payer: BC Managed Care – PPO | Admitting: Physician Assistant

## 2019-01-30 ENCOUNTER — Encounter: Payer: Self-pay | Admitting: Physician Assistant

## 2019-01-30 VITALS — BP 133/86 | HR 71 | Temp 97.8°F | Resp 16 | Wt 165.8 lb

## 2019-01-30 DIAGNOSIS — J069 Acute upper respiratory infection, unspecified: Secondary | ICD-10-CM | POA: Diagnosis not present

## 2019-01-30 DIAGNOSIS — Z6828 Body mass index (BMI) 28.0-28.9, adult: Secondary | ICD-10-CM | POA: Diagnosis not present

## 2019-01-30 DIAGNOSIS — N3001 Acute cystitis with hematuria: Secondary | ICD-10-CM

## 2019-01-30 DIAGNOSIS — R7303 Prediabetes: Secondary | ICD-10-CM

## 2019-01-30 DIAGNOSIS — R309 Painful micturition, unspecified: Secondary | ICD-10-CM

## 2019-01-30 DIAGNOSIS — Z789 Other specified health status: Secondary | ICD-10-CM

## 2019-01-30 MED ORDER — LIRAGLUTIDE -WEIGHT MANAGEMENT 18 MG/3ML ~~LOC~~ SOPN: 0.6000 mg | PEN_INJECTOR | Freq: Every day | SUBCUTANEOUS | 3 refills | Status: DC

## 2019-01-30 NOTE — Progress Notes (Signed)
Patient: Joyce Decker Female    DOB: [birth year]   63 y.o.   MRN: [id number] Visit Date: 01/30/2019  Today's Provider: Mar Daring, PA-C   Chief Complaint  Patient presents with  . Urinary Tract Infection   Subjective:    I, Sulibeya S. Dimas, CMA, am acting as a Education administrator for [employer], [employer].   Urinary Tract Infection   This is a new problem. The current episode started 1 to 4 weeks ago. The problem occurs intermittently. The problem has been unchanged. The quality of the pain is described as stabbing and shooting. The pain is severe. There has been no fever. There is no history of pyelonephritis. Associated symptoms include flank pain and hematuria. She has tried increased fluids and home medications (oxybutynin 5 mg that she got in 2017) for the symptoms. Her past medical history is significant for recurrent UTIs.  URI   This is a recurrent problem. The current episode started more than 1 month ago. The problem has been gradually worsening. There has been no fever. Associated symptoms include abdominal pain, congestion, coughing, dysuria, sinus pain and a sore throat. Pertinent negatives include no ear pain. She has tried nothing for the symptoms.   Also reports she has been trying a plant based diet since January. She has not lost any weight and is getting discouraged. She has previously used Contrave successfully but it is not covered by her insurance plan any longer.   Allergies  Allergen Reactions  . Other Other (See Comments)    Wheat barley and Grain unknown allergy, possible cause headache.  . Tape Other (See Comments)    Blisters  . Gadobenate Nausea Only    PT became nauseous with contrast, but did not vomit.    . Iodinated Diagnostic Agents Nausea Only     Current Outpatient Medications:  .  BIOTIN PO, Take 1 tablet by mouth daily. , Disp: , Rfl:  .  calcium-vitamin D (OSCAL WITH D) 500-200 MG-UNIT per tablet, Take 1 tablet by mouth., Disp: , Rfl:    .  Cinnamon 500 MG capsule, Take 500 mg by mouth daily., Disp: , Rfl:  .  CRANBERRY PO, Take 1 capsule by mouth daily., Disp: , Rfl:  .  Melatonin 3 MG TABS, Take 1 tablet by mouth at bedtime., Disp: , Rfl:  .  Multiple Vitamin (MULTIVITAMIN) capsule, Take 1 capsule by mouth daily., Disp: , Rfl:  .  Liraglutide -Weight Management (SAXENDA) 18 MG/3ML SOPN, Inject 0.6 mg into the skin daily. Increase by 0.6 mg per week until max dose of 3mg  daily achieved. (Patient not taking: Reported on 01/30/2019), Disp: 15 mL, Rfl: 3  Review of Systems  Constitutional: Negative.   HENT: Positive for congestion, sinus pain and sore throat. Negative for ear pain.   Respiratory: Positive for cough.   Cardiovascular: Negative.   Gastrointestinal: Positive for abdominal pain.  Genitourinary: Positive for dysuria, flank pain and hematuria.  Neurological: Negative.     Social History   Tobacco Use  . Smoking status: Former Research scientist (life sciences)  . Smokeless tobacco: Never Used  . Tobacco comment: 35 years ago  Substance Use Topics  . Alcohol use: No      Objective:   BP 133/86 (BP Location: Left Arm, Patient Position: Sitting, Cuff Size: Normal)   Pulse 71   Temp 97.8 F (36.6 C) (Oral)   Resp 16   Wt 165 lb 12.8 oz (75.2 kg)   SpO2 97%  BMI 28.46 kg/m  Vitals:   01/30/19 1716  BP: 133/86  Pulse: 71  Resp: 16  Temp: 97.8 F (36.6 C)  TempSrc: Oral  SpO2: 97%  Weight: 165 lb 12.8 oz (75.2 kg)     Physical Exam Vitals signs reviewed.  Constitutional:      General: She is not in acute distress.    Appearance: Normal appearance. She is well-developed. She is not ill-appearing or diaphoretic.  HENT:     Head: Normocephalic and atraumatic.     Right Ear: Hearing, tympanic membrane, ear canal and external ear normal.     Left Ear: Hearing, tympanic membrane, ear canal and external ear normal.     Nose: Congestion present.     Mouth/Throat:     Pharynx: Uvula midline. No oropharyngeal exudate.   Eyes:     General: No scleral icterus.       Right eye: No discharge.        Left eye: No discharge.     Conjunctiva/sclera: Conjunctivae normal.     Pupils: Pupils are equal, round, and reactive to light.  Neck:     Musculoskeletal: Normal range of motion and neck supple.     Thyroid: No thyromegaly.     Trachea: No tracheal deviation.  Cardiovascular:     Rate and Rhythm: Normal rate and regular rhythm.     Heart sounds: Normal heart sounds. No murmur. No friction rub. No gallop.   Pulmonary:     Effort: Pulmonary effort is normal. No respiratory distress.     Breath sounds: Normal breath sounds. No stridor. No wheezing or rales.  Abdominal:     General: Abdomen is flat.     Tenderness: There is no abdominal tenderness. There is no right CVA tenderness, left CVA tenderness or guarding.  Lymphadenopathy:     Cervical: No cervical adenopathy.  Skin:    General: Skin is warm and dry.  Neurological:     Mental Status: She is alert.        Assessment & Plan    1. Painful urination UA was positive. Will await culture results before treating. Patient is to continue pushing fluids and using AZO tabs.  - POCT urinalysis dipstick - Urine Culture  2. Acute cystitis with hematuria See above medical treatment plan.  3. Upper respiratory tract infection, unspecified type Continue OTC symptomatic relief of choice.   4. BMI 28.0-28.9,adult Will change medication to saxenda for medical weight loss. Once she has pen she is to return to the office for instructions on use.  - Liraglutide -Weight Management (SAXENDA) 18 MG/3ML SOPN; Inject 0.6 mg into the skin daily. Increase by 0.6 mg per week until max dose of 3mg  daily achieved.  Dispense: 15 mL; Refill: 3 - Comprehensive Metabolic Panel (CMET) - Lipid Profile - HgB A1c  5. Prediabetes H/O this. Diet controlled. Will check labs as below and f/u pending results. - Liraglutide -Weight Management (SAXENDA) 18 MG/3ML SOPN; Inject 0.6  mg into the skin daily. Increase by 0.6 mg per week until max dose of 3mg  daily achieved.  Dispense: 15 mL; Refill: 3 - Comprehensive Metabolic Panel (CMET) - Lipid Profile - HgB A1c  6. Vegetarian diet Will check labs as below and f/u pending results. - CBC w/Diff/Platelet - Comprehensive Metabolic Panel (CMET) - Lipid Profile - HgB A1c     Mar Daring, PA-C  [facility]

## 2019-01-30 NOTE — Patient Instructions (Signed)
Urinary Tract Infection, Adult A urinary tract infection (UTI) is an infection of any part of the urinary tract. The urinary tract includes:  The kidneys.  The ureters.  The bladder.  The urethra. These organs make, store, and get rid of pee (urine) in the body. What are the causes? This is caused by germs (bacteria) in your genital area. These germs grow and cause swelling (inflammation) of your urinary tract. What increases the risk? You are more likely to develop this condition if:  You have a small, thin tube (catheter) to drain pee.  You cannot control when you pee or poop (incontinence).  You are female, and: ? You use these methods to prevent pregnancy: ? A medicine that kills sperm (spermicide). ? A device that blocks sperm (diaphragm). ? You have low levels of a female hormone (estrogen). ? You are pregnant.  You have genes that add to your risk.  You are sexually active.  You take antibiotic medicines.  You have trouble peeing because of: ? A prostate that is bigger than normal, if you are female. ? A blockage in the part of your body that drains pee from the bladder (urethra). ? A kidney stone. ? A nerve condition that affects your bladder (neurogenic bladder). ? Not getting enough to drink. ? Not peeing often enough.  You have other conditions, such as: ? Diabetes. ? A weak disease-fighting system (immune system). ? Sickle cell disease. ? Gout. ? Injury of the spine. What are the signs or symptoms? Symptoms of this condition include:  Needing to pee right away (urgently).  Peeing often.  Peeing small amounts often.  Pain or burning when peeing.  Blood in the pee.  Pee that smells bad or not like normal.  Trouble peeing.  Pee that is cloudy.  Fluid coming from the vagina, if you are female.  Pain in the belly or lower back. Other symptoms include:  Throwing up (vomiting).  No urge to eat.  Feeling mixed up (confused).  Being tired  and grouchy (irritable).  A fever.  Watery poop (diarrhea). How is this treated? This condition may be treated with:  Antibiotic medicine.  Other medicines.  Drinking enough water. Follow these instructions at home:  Medicines  Take over-the-counter and prescription medicines only as told by your doctor.  If you were prescribed an antibiotic medicine, take it as told by your doctor. Do not stop taking it even if you start to feel better. General instructions  Make sure you: ? Pee until your bladder is empty. ? Do not hold pee for a long time. ? Empty your bladder after sex. ? Wipe from front to back after pooping if you are a female. Use each tissue one time when you wipe.  Drink enough fluid to keep your pee pale yellow.  Keep all follow-up visits as told by your doctor. This is important. Contact a doctor if:  You do not get better after 1-2 days.  Your symptoms go away and then come back. Get help right away if:  You have very bad back pain.  You have very bad pain in your lower belly.  You have a fever.  You are sick to your stomach (nauseous).  You are throwing up. Summary  A urinary tract infection (UTI) is an infection of any part of the urinary tract.  This condition is caused by germs in your genital area.  There are many risk factors for a UTI. These include having a small, thin   tube to drain pee and not being able to control when you pee or poop.  Treatment includes antibiotic medicines for germs.  Drink enough fluid to keep your pee pale yellow. This information is not intended to replace advice given to you by your health care provider. Make sure you discuss any questions you have with your health care provider. Document Released: 05/02/2008 Document Revised: 05/24/2018 Document Reviewed: 05/24/2018 Elsevier Interactive Patient Education  2019 Elsevier Inc.  

## 2019-01-31 LAB — POCT URINALYSIS DIPSTICK
Appearance: NORMAL
Bilirubin, UA: NEGATIVE
Blood, UA: NEGATIVE
Glucose, UA: NEGATIVE
Ketones, UA: NEGATIVE
Nitrite, UA: NEGATIVE
Protein, UA: NEGATIVE
Spec Grav, UA: 1.02 (ref 1.010–1.025)
Urobilinogen, UA: 0.2 E.U./dL
pH, UA: 6 (ref 5.0–8.0)

## 2019-02-01 ENCOUNTER — Telehealth: Payer: Self-pay | Admitting: Family Medicine

## 2019-02-01 MED ORDER — NITROFURANTOIN MONOHYD MACRO 100 MG PO CAPS: 100.0000 mg | ORAL_CAPSULE | Freq: Two times a day (BID) | ORAL | 0 refills | Status: DC

## 2019-02-01 NOTE — Telephone Encounter (Signed)
After hour call Patient was seen two days ago Awaiting antibiotics; she has not heard back from office  I reviewed urine, vitals from visit, allergies, medicines Culture is not back yet Approved Macrobid 100 mg BID #6 + 0 RF awaiting culture since it is Friday night at 8:35 pm Health nurse will call Rx in and notify patient

## 2019-02-02 LAB — URINE CULTURE

## 2019-02-04 ENCOUNTER — Telehealth: Payer: Self-pay

## 2019-02-04 DIAGNOSIS — N3000 Acute cystitis without hematuria: Secondary | ICD-10-CM

## 2019-02-04 MED ORDER — NITROFURANTOIN MONOHYD MACRO 100 MG PO CAPS: 100.0000 mg | ORAL_CAPSULE | Freq: Two times a day (BID) | ORAL | 0 refills | Status: DC

## 2019-02-04 NOTE — Telephone Encounter (Signed)
Ok I sent in 4 more days to hopefully fully treat.

## 2019-02-04 NOTE — Telephone Encounter (Signed)
noted 

## 2019-02-04 NOTE — Telephone Encounter (Signed)
Patient advised of negative urine culture. Patient will continue to take abx. sd

## 2019-02-04 NOTE — Telephone Encounter (Signed)
Patient was advised, antibiotic did help with symptoms.KW

## 2019-02-04 NOTE — Telephone Encounter (Signed)
-----   Message from Mar Daring, [place] sent at 02/04/2019  8:05 AM EDT ----- Urine culture has no bacterial growth. Did Macrobid help urine symptoms?

## 2019-02-04 NOTE — Telephone Encounter (Signed)
Pt called back and is taking her last antibiotic pill tomorrow. She is needing a call back to discuss why her urinine test showed no UTI.  Pt needing a call back ASAP to discuss today.  Thanks, [employer]

## 2019-02-11 ENCOUNTER — Ambulatory Visit: Payer: BC Managed Care – PPO | Admitting: Physician Assistant

## 2019-02-11 ENCOUNTER — Encounter: Payer: Self-pay | Admitting: Physician Assistant

## 2019-02-11 ENCOUNTER — Other Ambulatory Visit: Payer: Self-pay

## 2019-02-11 VITALS — Temp 98.7°F | Wt 168.8 lb

## 2019-02-11 DIAGNOSIS — Z713 Dietary counseling and surveillance: Secondary | ICD-10-CM | POA: Diagnosis not present

## 2019-02-11 MED ORDER — INSULIN PEN NEEDLE 32G X 4 MM MISC: 0 refills | Status: DC

## 2019-02-11 NOTE — Progress Notes (Signed)
Patient: Joyce Decker Female    DOB: 05/13/1956   63 y.o.   MRN: [id number] Visit Date: 02/11/2019  Today's Provider: Mar Daring, PA-C   Chief Complaint  Patient presents with  . Follow-up   Subjective:    I, Sulibeya S. Dimas, CMA, am acting as a Education administrator for [employer], PA-C.  HPI  Follow up for weight counseling   The patient was last seen for this 2 weeks ago. Changes made at last visit include start Saxenda.  Patient here today for demo on Saxenda.  Wt Readings from Last 3 Encounters:  02/11/19 168 lb 12.8 oz (76.6 kg)  01/30/19 165 lb 12.8 oz (75.2 kg)  10/08/18 165 lb (74.8 kg)   ------------------------------------------------------------------------------------   Allergies  Allergen Reactions  . Other Other (See Comments)    Wheat barley and Grain unknown allergy, possible cause headache.  . Tape Other (See Comments)    Blisters  . Gadobenate Nausea Only    PT became nauseous with contrast, but did not vomit.    . Iodinated Diagnostic Agents Nausea Only     Current Outpatient Medications:  .  BIOTIN PO, Take 1 tablet by mouth daily. , Disp: , Rfl:  .  calcium-vitamin D (OSCAL WITH D) 500-200 MG-UNIT per tablet, Take 1 tablet by mouth., Disp: , Rfl:  .  Cinnamon 500 MG capsule, Take 500 mg by mouth daily., Disp: , Rfl:  .  CRANBERRY PO, Take 1 capsule by mouth daily., Disp: , Rfl:  .  Insulin Pen Needle (BD PEN NEEDLE NANO 2ND GEN) 32G X 4 MM MISC, To use with saxenda, Disp: 100 each, Rfl: 0 .  Liraglutide -Weight Management (SAXENDA) 18 MG/3ML SOPN, Inject 0.6 mg into the skin daily. Increase by 0.6 mg per week until max dose of 3mg  daily achieved., Disp: 15 mL, Rfl: 3 .  Melatonin 3 MG TABS, Take 1 tablet by mouth at bedtime., Disp: , Rfl:  .  Multiple Vitamin (MULTIVITAMIN) capsule, Take 1 capsule by mouth daily., Disp: , Rfl:  .  nitrofurantoin, macrocrystal-monohydrate, (MACROBID) 100 MG capsule, Take 1 capsule (100 mg total) by  mouth 2 (two) times daily., Disp: 8 capsule, Rfl: 0  Review of Systems  Social History   Tobacco Use  . Smoking status: Former Research scientist (life sciences)  . Smokeless tobacco: Never Used  . Tobacco comment: 35 years ago  Substance Use Topics  . Alcohol use: No      Objective:   Temp 98.7 F (37.1 C) (Oral)   Wt 168 lb 12.8 oz (76.6 kg)   BMI 28.97 kg/m  Vitals:   02/11/19 1618  Temp: 98.7 F (37.1 C)  TempSrc: Oral  Weight: 168 lb 12.8 oz (76.6 kg)     Physical Exam Vitals signs reviewed.  Constitutional:      Appearance: She is well-developed.  HENT:     Head: Normocephalic and atraumatic.  Neck:     Musculoskeletal: Normal range of motion and neck supple.  Pulmonary:     Effort: Pulmonary effort is normal. No respiratory distress.  Neurological:     Mental Status: She is alert.  Psychiatric:        Behavior: Behavior normal.        Thought Content: Thought content normal.        Judgment: Judgment normal.         Assessment & Plan    1. Encounter for weight loss counseling Saxenda injection demonstrated and  then patient performed first injection on herself. All questions were answered.  - Insulin Pen Needle (BD PEN NEEDLE NANO 2ND GEN) 32G X 4 MM MISC; To use with saxenda  Dispense: 100 each; Refill: 0     Mar Daring, PA-C  [facility]

## 2019-02-12 LAB — CBC WITH DIFFERENTIAL/PLATELET
Basophils Absolute: 0.1 10*3/uL (ref 0.0–0.2)
Basos: 1 %
EOS (ABSOLUTE): 0.1 10*3/uL (ref 0.0–0.4)
Eos: 2 %
Hematocrit: 42.1 % (ref 34.0–46.6)
Hemoglobin: 14.6 g/dL (ref 11.1–15.9)
Immature Grans (Abs): 0 10*3/uL (ref 0.0–0.1)
Immature Granulocytes: 0 %
Lymphocytes Absolute: 2.3 10*3/uL (ref 0.7–3.1)
Lymphs: 30 %
MCH: 29.8 pg (ref 26.6–33.0)
MCHC: 34.7 g/dL (ref 31.5–35.7)
MCV: 86 fL (ref 79–97)
Monocytes Absolute: 0.8 10*3/uL (ref 0.1–0.9)
Monocytes: 10 %
Neutrophils Absolute: 4.5 10*3/uL (ref 1.4–7.0)
Neutrophils: 57 %
Platelets: 266 10*3/uL (ref 150–450)
RBC: 4.9 x10E6/uL (ref 3.77–5.28)
RDW: 12.2 % (ref 11.7–15.4)
WBC: 7.8 10*3/uL (ref 3.4–10.8)

## 2019-02-12 LAB — COMPREHENSIVE METABOLIC PANEL
ALT: 20 IU/L (ref 0–32)
AST: 18 IU/L (ref 0–40)
Albumin/Globulin Ratio: 1.9 (ref 1.2–2.2)
Albumin: 4.5 g/dL (ref 3.8–4.9)
Alkaline Phosphatase: 68 IU/L (ref 39–117)
BUN/Creatinine Ratio: 19 (ref 12–28)
BUN: 15 mg/dL (ref 8–27)
Bilirubin Total: 0.3 mg/dL (ref 0.0–1.2)
CO2: 25 mmol/L (ref 20–29)
Calcium: 9.8 mg/dL (ref 8.7–10.3)
Chloride: 99 mmol/L (ref 96–106)
Creatinine, Ser: 0.78 mg/dL (ref 0.57–1.00)
GFR calc Af Amer: 96 mL/min/{1.73_m2} (ref >59)
GFR calc non Af Amer: 83 mL/min/{1.73_m2} (ref >59)
Globulin, Total: 2.4 g/dL (ref 1.5–4.5)
Glucose: 95 mg/dL (ref 65–99)
Potassium: 3.4 mmol/L — ABNORMAL LOW (ref 3.5–5.2)
Sodium: 141 mmol/L (ref 134–144)
Total Protein: 6.9 g/dL (ref 6.0–8.5)

## 2019-02-12 LAB — HEMOGLOBIN A1C
Est. average glucose Bld gHb Est-mCnc: 111 mg/dL
Hgb A1c MFr Bld: 5.5 % (ref 4.8–5.6)

## 2019-02-12 LAB — LIPID PANEL
Chol/HDL Ratio: 3.3 ratio (ref 0.0–4.4)
Cholesterol, Total: 198 mg/dL (ref 100–199)
HDL: 60 mg/dL (ref >39)
LDL Calculated: 114 mg/dL — ABNORMAL HIGH (ref 0–99)
Triglycerides: 119 mg/dL (ref 0–149)
VLDL Cholesterol Cal: 24 mg/dL (ref 5–40)

## 2019-02-13 ENCOUNTER — Telehealth: Payer: Self-pay

## 2019-02-13 NOTE — Telephone Encounter (Signed)
Called [facility]

## 2019-02-13 NOTE — Telephone Encounter (Signed)
Patient advised as below. Patient would like magnesium and CA 125 added to lab.

## 2019-02-13 NOTE — Telephone Encounter (Signed)
lmtcb

## 2019-02-13 NOTE — Telephone Encounter (Signed)
Can we see if magnesium and the CA 125 can be added please?

## 2019-02-13 NOTE — Telephone Encounter (Signed)
-----   Message from Mar Daring, [place] sent at 02/13/2019  7:51 AM EDT ----- Blood count is normal. Kidney and liver function are normal. Potassium is borderline low so would recommend to increase potassium rich foods such as sweet potato, broccoli, bananas, etc. Cholesterol improved from last year. Sugar normal.

## 2019-02-14 LAB — CA 125: Cancer Antigen (CA) 125: 7.8 U/mL (ref 0.0–38.1)

## 2019-02-14 LAB — SPECIMEN STATUS REPORT

## 2019-02-14 LAB — MAGNESIUM: Magnesium: 1.5 mg/dL — ABNORMAL LOW (ref 1.6–2.3)

## 2019-02-15 ENCOUNTER — Telehealth: Payer: Self-pay

## 2019-02-15 NOTE — Telephone Encounter (Signed)
-----   Message from Mar Daring, [place] sent at 02/15/2019  7:43 AM EDT ----- CA 125 is normal at 7.8. Magnesium is borderline low again at 1.5 (low normal is 1.6). May need to restart low magnesium supplement 250mg  daily.

## 2019-02-15 NOTE — Telephone Encounter (Signed)
Pt advised.   Thanks,   -Tearsa Kowalewski  

## 2019-03-06 ENCOUNTER — Telehealth: Payer: Self-pay

## 2019-03-06 DIAGNOSIS — R110 Nausea: Secondary | ICD-10-CM

## 2019-03-06 MED ORDER — ONDANSETRON HCL 4 MG PO TABS: 4.0000 mg | ORAL_TABLET | Freq: Three times a day (TID) | ORAL | 0 refills | Status: DC | PRN

## 2019-03-06 NOTE — Telephone Encounter (Signed)
Sent over zofran.

## 2019-03-06 NOTE — Telephone Encounter (Signed)
Patient reports that she is feeling very nauseas with the Saxenda so she has decided to keep doing the lower dose (1.2). She is asking if she can get a prescription for Zofran to help with it. Reports that it looks like is getting better but wants to have some zofran on hand. Pharmacy: [place] [facility]

## 2019-05-28 ENCOUNTER — Other Ambulatory Visit: Payer: Self-pay | Admitting: Physician Assistant

## 2019-05-28 DIAGNOSIS — Z713 Dietary counseling and surveillance: Secondary | ICD-10-CM

## 2019-06-13 ENCOUNTER — Telehealth: Payer: Self-pay

## 2019-06-13 NOTE — Telephone Encounter (Signed)
Patient advised.

## 2019-06-13 NOTE — Telephone Encounter (Signed)
Patient would like to know if you can fill out a temporary accomodation request form for her because she has asthma. 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

## 2019-06-13 NOTE — Telephone Encounter (Signed)
Yes she just has to bring form for me to fill out.

## 2019-06-24 NOTE — Telephone Encounter (Signed)
Pt also needed to have a changes to her form.  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   Contact number: [phone]   Thanks,   -Mickel Baas

## 2019-06-25 NOTE — Telephone Encounter (Signed)
I do not write what they are requesting as their accomodations, I only write the diagnoses that are documented for why they are requesting said accomodation. It is up to the school system to allow any accomodation.

## 2019-06-25 NOTE — Telephone Encounter (Signed)
Patient states that she was told that if they were high risk that they need a note from the provider if they only doing remotely, due to patient's husband had a kidney transplant and is immuno compromised and she has asthma and is older and she had a meeting today with the principal that if they are high risk that they can request this from the provider. States that she really thinks this is the safest way to keep her family safe.

## 2019-06-25 NOTE — Telephone Encounter (Signed)
Patient wants letter to be fax to: [phone] Attn:cheryl Caprice Beaver

## 2019-06-25 NOTE — Telephone Encounter (Signed)
Letter printed.

## 2019-06-26 NOTE — Telephone Encounter (Signed)
Note faxed today

## 2019-07-04 ENCOUNTER — Telehealth: Payer: Self-pay | Admitting: *Deleted

## 2019-07-04 NOTE — Telephone Encounter (Signed)
This patient who was last seen 2 years ago called asking if she needs to follow up with GYN Onc. Per Dr Gershon Crane 06/2017 note she was to  see Urology and follow up with her in 1 year. Please advise on this matter

## 2019-07-05 ENCOUNTER — Other Ambulatory Visit: Payer: Self-pay | Admitting: Nurse Practitioner

## 2019-07-05 DIAGNOSIS — Z8543 Personal history of malignant neoplasm of ovary: Secondary | ICD-10-CM

## 2019-07-05 NOTE — Telephone Encounter (Signed)
Returned patient's call to discuss surveillance recommendations. Left message to return call.

## 2019-08-07 ENCOUNTER — Encounter: Payer: Self-pay | Admitting: Physician Assistant

## 2019-08-07 ENCOUNTER — Other Ambulatory Visit: Payer: Self-pay

## 2019-08-07 ENCOUNTER — Ambulatory Visit: Payer: BC Managed Care – PPO | Admitting: Physician Assistant

## 2019-08-07 VITALS — BP 126/79 | HR 70 | Temp 97.1°F | Resp 16 | Wt 157.0 lb

## 2019-08-07 DIAGNOSIS — H00012 Hordeolum externum right lower eyelid: Secondary | ICD-10-CM

## 2019-08-07 DIAGNOSIS — T3695XA Adverse effect of unspecified systemic antibiotic, initial encounter: Secondary | ICD-10-CM

## 2019-08-07 DIAGNOSIS — B379 Candidiasis, unspecified: Secondary | ICD-10-CM

## 2019-08-07 DIAGNOSIS — Z23 Encounter for immunization: Secondary | ICD-10-CM | POA: Diagnosis not present

## 2019-08-07 DIAGNOSIS — Z6828 Body mass index (BMI) 28.0-28.9, adult: Secondary | ICD-10-CM

## 2019-08-07 DIAGNOSIS — L03213 Periorbital cellulitis: Secondary | ICD-10-CM

## 2019-08-07 MED ORDER — FLUCONAZOLE 150 MG PO TABS: 150.0000 mg | ORAL_TABLET | Freq: Once | ORAL | 0 refills | Status: AC

## 2019-08-07 MED ORDER — NALTREXONE-BUPROPION HCL ER 8-90 MG PO TB12: ORAL_TABLET | ORAL | 11 refills | Status: DC

## 2019-08-07 MED ORDER — AMOXICILLIN-POT CLAVULANATE 875-125 MG PO TABS: 1.0000 | ORAL_TABLET | Freq: Two times a day (BID) | ORAL | 0 refills | Status: DC

## 2019-08-07 NOTE — Patient Instructions (Signed)
Preseptal Cellulitis, Adult ° °Preseptal cellulitis is an infection of the eyelid and the tissues around the eye (periorbital area). The infection causes painful swelling and redness. This condition may also be called periorbital cellulitis. °In most cases, the condition can be treated with antibiotic medicine at home. It is important to treat preseptal cellulitis right away so that it does not get worse. If it gets worse, it can spread to the eye socket and eye muscles (orbital cellulitis). Orbital cellulitis is a medical emergency. °What are the causes? °Preseptal cellulitis is most commonly caused by bacteria. In rare cases, it can be caused by a virus or fungus. The germs that cause preseptal cellulitis may come from: °· A sinus infection that spreads near the eyes. °· An injury near the eye, such as a scratch, animal bite, or insect bite. °· A skin rash that becomes infected, such as eczema or poison ivy. °· An infected pimple on the eyelid (stye). °· Infection after eyelid surgery or injury. °What increases the risk? °You are more likely to develop this condition if: °· You have a weakened disease-fighting system (immune system). °· You have a medical condition that raises your risk for sinus infections, such as nasal polyps. °What are the signs or symptoms? °Symptoms of this condition usually develop suddenly. Symptoms may include: °· Eyelids that are red and swollen and feel unusually hot. °· Fever. °· Difficulty opening the eye. °· Headache. °· Facial pain. °How is this diagnosed? °This condition may be diagnosed based on your symptoms, your medical history, and an eye exam. You may have tests, such as: °· Blood tests. °· CT scan. °· MRI. °How is this treated? °This condition is treated with antibiotic medicines. These may be given by mouth (orally), through an IV, or as a shot. In rare cases, you may need surgery to drain an infected area. °Follow these instructions at home: °Medicines °· If you were  prescribed an antibiotic to take at home, take it as told by your health care provider. Do not stop taking the antibiotic even if you start to feel better. °· Take over-the-counter and prescription medicines only as told by your health care provider. °Eye Care °· Do not use eye drops without first getting approval from your health care provider. °· Do not touch or rub your eye. If you wear contact lenses, do not wear them until your health care provider approves. °· Keep the eye area clean and dry. °· Wash the eye area with a clean washcloth, warm water, and baby shampoo or mild soap. °· To help relieve discomfort, place a clean washcloth that is wet with warm water over your eye. Leave the washcloth on for a few minutes, then remove it. °General instructions °· Wash your hands with soap and water often. If soap and water are not available, use hand sanitizer. °· Do not use any products that contain nicotine or tobacco, such as cigarettes and e-cigarettes. If you need help quitting, ask your health care provider. °· Drink enough fluid to keep your urine pale yellow. °· Ask your health care provider if it is safe for you to drive. °· Stay up to date on your vaccinations. °· Keep all follow-up visits as told by your health care provider. This includes any visits with an eye specialist (ophthalmologist) or dentist. This is important. °Get help right away if: °· You have new symptoms. °· Your symptoms get worse or do not get better with treatment. °· You have a fever. °·   Your vision becomes blurry or gets worse in any way.  Your eye looks like it is sticking out or bulging out (proptosis).  You have trouble moving your eyes.  You have a severe headache.  You have neck stiffness or severe neck pain. Summary  Preseptal cellulitis is an infection of the eyelid and the tissues around the eye.  Symptoms of preseptal cellulitis usually develop suddenly and include red and swollen eyelids, fever, difficulty  opening the eye, headache, and facial pain.  This condition is treated with antibiotic medicines. Do not stop taking the antibiotic even if you start to feel better. This information is not intended to replace advice given to you by your health care provider. Make sure you discuss any questions you have with your health care provider. Document Released: 12/17/2010 Document Revised: 10/27/2017 Document Reviewed: 09/06/2017 Elsevier Patient Education  2020 Elsevier Inc.  

## 2019-08-07 NOTE — Progress Notes (Signed)
Patient: Joyce Decker Female    DOB: 05-28-1956   63 y.o.   MRN: [id number] Visit Date: 08/07/2019  Today's Provider: Mar Daring, PA-C   Chief Complaint  Patient presents with  . Stye   Subjective:    I,Joyce Decker,RMA am acting as a Education administrator for [employer], PA-C.  HPI Patient with c/o stye in lower eye lid, right side. This started about 8-10 days ago. Now is painful, watery, redness. Swelling and redness is now present in the lower lid and into the cheek to the zygomatic process area. Treatment tried at home: warm compress.   Allergies  Allergen Reactions  . Other Other (See Comments)    Wheat barley and Grain unknown allergy, possible cause headache.  . Tape Other (See Comments)    Blisters  . Gadobenate Nausea Only    PT became nauseous with contrast, but did not vomit.    . Iodinated Diagnostic Agents Nausea Only     Current Outpatient Medications:  .  BD PEN NEEDLE NANO 2ND GEN 32G X 4 MM MISC, TO USE WITH SAXENDA, Disp: 100 each, Rfl: 3 .  BIOTIN PO, Take 1 tablet by mouth daily. , Disp: , Rfl:  .  calcium-vitamin D (OSCAL WITH D) 500-200 MG-UNIT per tablet, Take 1 tablet by mouth., Disp: , Rfl:  .  Cinnamon 500 MG capsule, Take 500 mg by mouth daily., Disp: , Rfl:  .  CRANBERRY PO, Take 1 capsule by mouth daily., Disp: , Rfl:  .  Liraglutide -Weight Management (SAXENDA) 18 MG/3ML SOPN, Inject 0.6 mg into the skin daily. Increase by 0.6 mg per week until max dose of 3mg  daily achieved., Disp: 15 mL, Rfl: 3 .  Melatonin 3 MG TABS, Take 1 tablet by mouth at bedtime., Disp: , Rfl:  .  Multiple Vitamin (MULTIVITAMIN) capsule, Take 1 capsule by mouth daily., Disp: , Rfl:  .  ondansetron (ZOFRAN) 4 MG tablet, Take 1 tablet (4 mg total) by mouth every 8 (eight) hours as needed. (Patient not taking: Reported on 08/07/2019), Disp: 20 tablet, Rfl: 0  Review of Systems  Constitutional: Negative.   HENT: Positive for facial swelling.   Eyes:  Positive for pain, discharge, redness and visual disturbance.  Respiratory: Negative.   Cardiovascular: Negative.   Musculoskeletal: Negative.   Neurological: Negative.     Social History   Tobacco Use  . Smoking status: Former Research scientist (life sciences)  . Smokeless tobacco: Never Used  . Tobacco comment: 35 years ago  Substance Use Topics  . Alcohol use: No      Objective:   BP 126/79 (BP Location: Left Arm, Patient Position: Sitting, Cuff Size: Large)   Pulse 70   Temp (!) 97.1 F (36.2 C) (Other (Comment)) Comment (Src): forehead  Resp 16   Wt 157 lb (71.2 kg)   BMI 26.95 kg/m  Vitals:   08/07/19 1557  BP: 126/79  Pulse: 70  Resp: 16  Temp: (!) 97.1 F (36.2 C)  TempSrc: Other (Comment)  Weight: 157 lb (71.2 kg)  Body mass index is 26.95 kg/m.   Physical Exam Vitals signs reviewed.  Constitutional:      General: She is not in acute distress.    Appearance: Normal appearance. She is well-developed. She is not ill-appearing or diaphoretic.  HENT:     Head: Normocephalic and atraumatic. Right periorbital erythema present.      Right Ear: Tympanic membrane, ear canal and external ear normal.  Left Ear: Tympanic membrane, ear canal and external ear normal.  Eyes:     General: No scleral icterus.       Right eye: Discharge and hordeolum (right lower lid) present.        Left eye: No discharge.     Extraocular Movements: Extraocular movements intact.     Right eye: Normal extraocular motion.     Left eye: Normal extraocular motion.     Pupils: Pupils are equal, round, and reactive to light.  Neck:     Musculoskeletal: Normal range of motion and neck supple.     Thyroid: No thyromegaly.     Vascular: No JVD.     Trachea: No tracheal deviation.  Cardiovascular:     Rate and Rhythm: Normal rate and regular rhythm.     Heart sounds: Normal heart sounds. No murmur. No friction rub. No gallop.   Pulmonary:     Effort: Pulmonary effort is normal. No respiratory distress.      Breath sounds: Normal breath sounds. No wheezing or rales.  Lymphadenopathy:     Cervical: No cervical adenopathy.  Neurological:     Mental Status: She is alert.      No results found for any visits on 08/07/19.     Assessment & Plan    1. Preseptal cellulitis of right lower eyelid Worsening. Start Augmentin as below. Apply ice to the area. Call if worsening.  - amoxicillin-clavulanate (AUGMENTIN) 875-125 MG tablet; Take 1 tablet by mouth 2 (two) times daily.  Dispense: 20 tablet; Refill: 0  2. Hordeolum externum of right lower eyelid See above medical treatment plan. - amoxicillin-clavulanate (AUGMENTIN) 875-125 MG tablet; Take 1 tablet by mouth 2 (two) times daily.  Dispense: 20 tablet; Refill: 0  3. Antibiotic-induced yeast infection Gets yeast infections with antibiotics. Diflucan given as below.  - fluconazole (DIFLUCAN) 150 MG tablet; Take 1 tablet (150 mg total) by mouth once for 1 dose. May repeat in 48-72 hrs if needed  Dispense: 2 tablet; Refill: 0  4. BMI 28.0-28.9,adult Failed Saxenda. Restart Contrave as below.  - Naltrexone-buPROPion HCl ER 8-90 MG TB12; Start 1 tablet every morning for 7 days, then 1 tablet twice daily for 7 days, then 2 tablets every morning and one in the evening  Dispense: 120 tablet; Refill: 11  5. Need for influenza vaccination Flu vaccine given today without complication. Patient sat upright for 15 minutes to check for adverse reaction before being released. - Flu Vaccine QUAD 36+ mos IM     Mar Daring, PA-C  [facility]

## 2019-08-09 ENCOUNTER — Telehealth: Payer: Self-pay

## 2019-08-09 ENCOUNTER — Ambulatory Visit: Payer: BC Managed Care – PPO | Admitting: Physician Assistant

## 2019-08-09 NOTE — Telephone Encounter (Signed)
Patient states that the Minette [place] is not covered by insurance. Can a PA be done for it? If not can you prescribe that active ingredient in the Contrave?

## 2019-08-09 NOTE — Telephone Encounter (Signed)
We will do the PA once received.

## 2019-08-14 ENCOUNTER — Telehealth: Payer: Self-pay | Admitting: Physician Assistant

## 2019-08-14 DIAGNOSIS — Z6828 Body mass index (BMI) 28.0-28.9, adult: Secondary | ICD-10-CM

## 2019-08-14 DIAGNOSIS — H00012 Hordeolum externum right lower eyelid: Secondary | ICD-10-CM

## 2019-08-14 MED ORDER — NALTREXONE-BUPROPION HCL ER 8-90 MG PO TB12: ORAL_TABLET | ORAL | 11 refills | Status: DC

## 2019-08-14 MED ORDER — ERYTHROMYCIN 5 MG/GM OP OINT: 1.0000 "application " | TOPICAL_OINTMENT | Freq: Every day | OPHTHALMIC | 0 refills | Status: DC

## 2019-08-14 NOTE — Telephone Encounter (Signed)
1- Patient wanted to know if we had done the prior auth. On Contrave  2- The stye has come back on her eye. It is not red and inflammed as before but it is back and wants to know if she needs to go eye doctor

## 2019-08-14 NOTE — Telephone Encounter (Signed)
No we have not seen it yet.  We can try to calm down again with an antibiotic ointment this time since it was so severe last time. If it recurs or does not improve then you need to see the eye doctor.

## 2019-08-15 ENCOUNTER — Telehealth: Payer: Self-pay | Admitting: Physician Assistant

## 2019-08-15 NOTE — Telephone Encounter (Signed)
LMOVM for pt to return call 

## 2019-08-15 NOTE — Telephone Encounter (Signed)
Pt returned call to Jeronimo Norma

## 2019-08-16 NOTE — Telephone Encounter (Signed)
Patient advised.

## 2019-08-16 NOTE — Telephone Encounter (Signed)
Patient was advised. Patient stated she has an appt with eye doctor 08/27/2019 and she will speak them about her symptoms.

## 2019-08-19 ENCOUNTER — Inpatient Hospital Stay: Payer: BC Managed Care – PPO

## 2019-08-20 ENCOUNTER — Telehealth: Payer: Self-pay | Admitting: *Deleted

## 2019-08-20 ENCOUNTER — Telehealth: Payer: Self-pay

## 2019-08-20 NOTE — Telephone Encounter (Signed)
Patient called and would to come by to pick labs. She would like a return call after 3 today. This was a message from answering service

## 2019-08-20 NOTE — Telephone Encounter (Signed)
Received message that Joyce Decker would like someone to call her regarding her appointment with Dr. Theora Gianotti tomorrow. Call returned. No answer. Left voicemail.

## 2019-08-21 ENCOUNTER — Other Ambulatory Visit: Payer: Self-pay

## 2019-08-21 ENCOUNTER — Inpatient Hospital Stay: Payer: BC Managed Care – PPO | Attending: Obstetrics and Gynecology | Admitting: Obstetrics and Gynecology

## 2019-08-21 ENCOUNTER — Inpatient Hospital Stay: Payer: BC Managed Care – PPO

## 2019-08-21 ENCOUNTER — Encounter (INDEPENDENT_AMBULATORY_CARE_PROVIDER_SITE_OTHER): Payer: Self-pay

## 2019-08-21 VITALS — BP 122/87 | HR 78 | Temp 98.0°F | Ht 64.0 in | Wt 158.3 lb

## 2019-08-21 DIAGNOSIS — J45909 Unspecified asthma, uncomplicated: Secondary | ICD-10-CM | POA: Insufficient documentation

## 2019-08-21 DIAGNOSIS — E039 Hypothyroidism, unspecified: Secondary | ICD-10-CM | POA: Diagnosis not present

## 2019-08-21 DIAGNOSIS — Z8543 Personal history of malignant neoplasm of ovary: Secondary | ICD-10-CM | POA: Diagnosis present

## 2019-08-21 DIAGNOSIS — Z1501 Genetic susceptibility to malignant neoplasm of breast: Secondary | ICD-10-CM

## 2019-08-21 DIAGNOSIS — Z90722 Acquired absence of ovaries, bilateral: Secondary | ICD-10-CM

## 2019-08-21 DIAGNOSIS — E785 Hyperlipidemia, unspecified: Secondary | ICD-10-CM | POA: Diagnosis not present

## 2019-08-21 DIAGNOSIS — Z148 Genetic carrier of other disease: Secondary | ICD-10-CM | POA: Diagnosis not present

## 2019-08-21 DIAGNOSIS — Z08 Encounter for follow-up examination after completed treatment for malignant neoplasm: Secondary | ICD-10-CM

## 2019-08-21 DIAGNOSIS — Z9221 Personal history of antineoplastic chemotherapy: Secondary | ICD-10-CM

## 2019-08-21 DIAGNOSIS — Z79899 Other long term (current) drug therapy: Secondary | ICD-10-CM | POA: Diagnosis not present

## 2019-08-21 DIAGNOSIS — Z90710 Acquired absence of both cervix and uterus: Secondary | ICD-10-CM | POA: Diagnosis not present

## 2019-08-21 DIAGNOSIS — E119 Type 2 diabetes mellitus without complications: Secondary | ICD-10-CM | POA: Diagnosis not present

## 2019-08-21 DIAGNOSIS — G4730 Sleep apnea, unspecified: Secondary | ICD-10-CM | POA: Diagnosis not present

## 2019-08-21 NOTE — Progress Notes (Signed)
Gynecologic Oncology Consult Visit   Self referral   Chief Concern: h/o ovarian cancer  Subjective:  Joyce Decker is a 63 y.o. female who is seen for h/o stage III ovarian cancer, BRCA 1 mutation who presents to clinic today for continued surveillance.   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 She was diagnosed and December 2006. She underwent cytoreductive surgery. Postoperatively she received adjuvant therapy with cisplatin and paclitaxel, intraperitoneal and IV therapy, x 6 cycles. She had a complete remission. Her post treatment course was significant for prolonged hypomagnesemia. She has been NED since. She was the last to follow up for several years. She was a referred to a gyn given her excellent survival. She was seen by Dr. Theora Gianotti on 07/05/2017. NED at that time.  CA 125  02/10/2017 7.8 07/06/2017 9.0 7 years ago 7.3 8 years ago 8.5  Last mammogram 06/2016.  She has been followed by [facility] but has not been seen since 2017.   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   She presents today for surveillance. She denies concerning symptoms.   Problem List: Patient Active Problem List   Diagnosis Date Noted  . 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 History of migraine headaches 04/09/2015  . HLD (hyperlipidemia) 04/09/2015  . Chronic interstitial cystitis 04/09/2015  . H/O ovarian cancer 06/08/2012  . H/O renal calculi 06/08/2012  . Urethral cyst 06/08/2012  . 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 Awareness of heartbeats 02/19/2010  . Apnea, sleep 02/19/2010  . Airway hyperreactivity 05/06/2008  . Family history of diabetes mellitus 05/06/2008  . Fam hx-ischem heart disease 05/06/2008    Past Medical History:  Past Medical History:  Diagnosis Date  . Asthma    . BRCA1 gene mutation positive   . Cancer ([place])    ovarian cancer 2006  . Gestational diabetes   . Hyperthyroidism   . Hypothyroidism   . Kidney stones   . Sleep apnea    CPAP    Past Surgical History: Past Surgical History:  Procedure Laterality Date  . ABDOMINAL HYSTERECTOMY Bilateral    AND BSO  . COLONOSCOPY WITH PROPOFOL N/A 07/03/2015   Procedure: COLONOSCOPY WITH PROPOFOL;  Surgeon: Josefine Class, MD;  Location[facility] ENDOSCOPY;  Service: Endoscopy;  Laterality: N/A;  . Dental Implant    . OOPHORECTOMY    . TONSILLECTOMY AND ADENOIDECTOMY      Past Gynecologic History:  See HPI  OB History: Has 3 children- 3 boys OB History  No obstetric history on file.    Family History: Family History  Problem Relation Age of Onset  . Diabetes Mother   . Hypertension Mother   . Diabetes Maternal Aunt   . Heart disease Maternal Aunt   . Hypertension Maternal Aunt   . Diabetes Maternal Uncle   . Heart disease Maternal Uncle   . Hypertension Maternal Uncle   . Colon cancer Maternal Uncle   . Brain cancer Maternal Grandmother   . Diabetes Cousin   . Breast cancer Cousin   . Melanoma Other   . Stomach cancer Other   . Prostate cancer Maternal Uncle   . Prostate cancer Maternal Uncle   . Breast cancer Cousin   . Breast cancer Cousin   . Breast cancer Cousin   . 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 Social History   Socioeconomic History  . Marital status: Married    Spouse name: Not on file  . Number of children: Not on file  . Years of education: Not on file  . Highest education level: Not on file  Occupational History  . Not on file  Social Needs  . Financial resource strain: Not on file  . Food insecurity    Worry: Not on file    Inability: Not on file  . Transportation needs    Medical: Not on file    Non-medical: Not on file  Tobacco Use  . Smoking status: Former Research scientist (life sciences)  .  Smokeless tobacco: Never Used  . Tobacco comment: 35 years ago  Substance and Sexual Activity  . Alcohol use: No  . Drug use: No  . Sexual activity: Yes  Lifestyle  . Physical activity    Days per week: Not on file    Minutes per session: Not on file  . Stress: Not on file  Relationships  . Social Herbalist on phone: Not on file    Gets together: Not on file    Attends religious service: Not on file    Active member of club or organization: Not on file    Attends meetings of clubs or organizations: Not on file    Relationship status: Not on file  . Intimate partner violence    Fear of current or ex partner: Not on file    Emotionally abused: Not on file    Physically abused: Not on file    Forced sexual activity: Not on file  Other Topics Concern  . Not on file  Social History Narrative  . Not on file    Allergies: Allergies  Allergen Reactions  . Other Other (See Comments)    Wheat barley and Grain unknown allergy, possible cause headache.  . Tape Other (See Comments)    Blisters  . Gadobenate Nausea Only    PT became nauseous with contrast, but did not vomit.    . Iodinated Diagnostic Agents Nausea Only    Current Medications: Current Outpatient Medications  Medication Sig Dispense Refill  . BD PEN NEEDLE NANO 2ND GEN 32G X 4 MM MISC TO USE WITH SAXENDA 100 each 3  . BIOTIN PO Take 1 tablet by mouth daily.     . calcium-vitamin D (OSCAL WITH D) 500-200 MG-UNIT per tablet Take 1 tablet by mouth.    . Cinnamon 500 MG capsule Take 500 mg by mouth daily.    Marland Kitchen CRANBERRY PO Take 1 capsule by mouth daily.    Marland Kitchen erythromycin ophthalmic ointment Place 1 application into the right eye at bedtime. 3.5 g 0  . Melatonin 3 MG TABS Take 1 tablet by mouth at bedtime.    . Multiple Vitamin (MULTIVITAMIN) capsule Take 1 capsule by mouth daily.    . Naltrexone-buPROPion HCl ER 8-90 MG TB12 Start 1 tablet every morning for 7 days, then 1 tablet twice daily for 7 days,  then 2 tablets every morning and one in the evening (Patient not taking: Reported on 08/21/2019) 120 tablet 11  . ondansetron (ZOFRAN) 4 MG tablet Take 1 tablet (4 mg total) by mouth every 8 (eight) hours as needed. (Patient not taking: Reported on 08/07/2019) 20 tablet 0   No current facility-administered medications for this visit.    Review of Systems General: negative for  fevers, chills, fatigue, changes in sleep, changes in weight or appetite Skin: negative for changes in color, texture, moles or lesions. Positive or facial flushing Eyes: negative for changes in vision, pain, diplopia HEENT: negative for change in hearing, pain, discharge, tinnitus, vertigo, voice changes, sore throat, neck masses Breasts: negative for breast lumps Pulmonary: negative for dyspnea, orthopnea, productive cough Cardiac: negative for palpitations, syncope, pain, discomfort, pressure Gastrointestinal: negative for dysphagia, nausea, vomiting, jaundice, pain, constipation, diarrhea, hematemesis, hematochezia Genitourinary/Sexual: negative for dysuria, discharge, hesitancy, nocturia, retention, stones, infections, STD's, incontinence Ob/Gyn: negative for irregular bleeding, pain Musculoskeletal: negative for pain, stiffness, swelling, range of motion limitation Hematology: negative for easy bruising, bleeding Neurologic/Psych: negative for headaches, seizures, paralysis, weakness, tremor, change in gait, change in sensation, mood swings, depression, anxiety, change in memory  Objective:  Physical Examination:  BP 122/87 (BP Location: Left Arm, Patient Position: Sitting)   Pulse 78   Temp 98 F (36.7 C) (Tympanic)   Ht _0  (1.626 m)   Wt 158 lb 4.8 oz (71.8 kg)   BMI 27.17 kg/m    ECOG Performance Status: 0 - Asymptomatic   GENERAL: Patient is a well appearing female in no acute distress HEENT:  Sclera clear. Anicteric NODES:  Negative axillary, supraclavicular, inguinal lymph node survery LUNGS:   Clear to auscultation bilaterally.   HEART:  Regular rate and rhythm.  ABDOMEN:  Soft, nontender.  No hernias, incisions well healed. No masses or ascites EXTREMITIES:  No peripheral edema. Atraumatic. No cyanosis SKIN:  Clear with no obvious rashes or skin changes.  NEURO:  Nonfocal. Well oriented.  Appropriate affect.  Pelvic: Chaperoned by NP. Vulva: normal appearing vulva with no masses, tenderness or lesions. Birth mark at 7:00. Periurethral cyst on the right has resolved;  Vagina: normal; Adnexa, Uterus and Cervix: surgically absent; BME negative for masses or nodularity. Rectal deferred  Lab Review CA 125 scheduled for lab draw on 08/26/2019  Radiologic Imaging: n/a    Assessment:  SHALIA BARTKO is a 63 y.o. female h/o stage III ovarian cancer, BRCA 1 mutation. Clinically NED.    Medical co-morbidities complicating care: prior abdominal surgery, positive BRCA mutation carrier Plan:   Problem List Items Addressed This Visit    None    Visit Diagnoses    Encounter for follow-up surveillance of ovarian cancer    -  Primary     CA125 ordered. If negative follow up in one year with repeat CA-125 prior to visit.   We again recommended continued screening procedures and reviewed risks for other cancers given her BRCA 1 mutation status. She was encouraged to continue receiving her high risk breast care at [facility] but will also discuss with medical-oncology for options locally. Her oldest son has tested positive for BRCA mutation and we again encouraged her to have her younger sons tested as well. Discussed options for preimplantation genetics as well as other screening recommendations.   The patient's diagnosis, an outline of the further diagnostic and laboratory studies which will be required, the recommendation, and alternatives were discussed.  All questions were answered to the patient's satisfaction.  Verlon Au, NP   I personally had a face to face interaction and evaluated  the patient jointly with the NP, Ms. Beckey Rutter.  I have reviewed her history and available records and have performed the key portions of the physical exam including abdominal exam, pelvic exam with my findings confirming those documented above by the APP.  I have discussed the case  with the APP and the patient.  I agree with the above documentation, assessment and plan which was fully formulated by me.  Counseling was completed by me.  A total of 30 minutes were spent with the patient/family today; >50% was spent in education, counseling and coordination of care for history stage III ovarian cancer, BRCA 1 mutation.   I personally saw the patient and performed a substantive portion of this encounter in conjunction with the listed APP as documented above.  Magdalina Whitehead Gaetana Michaelis, MD    CC:  PCP:  Dr. Margarita Rana  Mar Daring, PA-C [address] [place], [place] [address] [phone]

## 2019-08-22 ENCOUNTER — Other Ambulatory Visit: Payer: Self-pay | Admitting: Nurse Practitioner

## 2019-08-22 DIAGNOSIS — Z1501 Genetic susceptibility to malignant neoplasm of breast: Secondary | ICD-10-CM

## 2019-08-22 DIAGNOSIS — Z1509 Genetic susceptibility to other malignant neoplasm: Secondary | ICD-10-CM

## 2019-08-22 NOTE — Progress Notes (Signed)
Referral to high risk breast clinic based on patient's brca1 mutation status

## 2019-08-26 ENCOUNTER — Inpatient Hospital Stay: Payer: BC Managed Care – PPO

## 2019-09-03 ENCOUNTER — Inpatient Hospital Stay: Payer: BC Managed Care – PPO | Attending: Oncology

## 2019-09-11 ENCOUNTER — Inpatient Hospital Stay: Payer: BC Managed Care – PPO | Admitting: Oncology

## 2019-09-11 ENCOUNTER — Telehealth: Payer: Self-pay | Admitting: Nurse Practitioner

## 2019-09-11 ENCOUNTER — Other Ambulatory Visit: Payer: Self-pay

## 2019-09-11 NOTE — Telephone Encounter (Signed)
Called patient to follow up on missed appointment for lab and high risk breast clinic. Asked her to return call to reschedule to have CA-125 drawn.

## 2019-09-23 ENCOUNTER — Inpatient Hospital Stay: Payer: BC Managed Care – PPO

## 2019-10-08 ENCOUNTER — Telehealth: Payer: Self-pay | Admitting: Oncology

## 2019-10-08 NOTE — Telephone Encounter (Signed)
LM to try and R/S NS NP appt-ltg

## 2019-12-12 ENCOUNTER — Ambulatory Visit: Payer: BC Managed Care – PPO | Admitting: Physician Assistant

## 2020-01-15 ENCOUNTER — Telehealth: Payer: Self-pay | Admitting: Oncology

## 2020-01-15 NOTE — Telephone Encounter (Signed)
Patient phoned on 01-14-20 and left voice mail asking about scheduling a mammogram. Writer spoke with [facility] and [employer] and was informed that patient's PCP would need to place the order for this. Was also advised to see if patient wants to reschedule with the high risk breast clinic. Writer phoned patient on 01-15-20 and left voice mail with the above information and requested return call to reschedule with the high risk breast clinic, if desired.

## 2020-01-28 ENCOUNTER — Encounter: Payer: Self-pay | Admitting: Oncology

## 2020-01-28 ENCOUNTER — Inpatient Hospital Stay: Payer: BC Managed Care – PPO

## 2020-01-28 ENCOUNTER — Inpatient Hospital Stay: Payer: BC Managed Care – PPO | Attending: Oncology | Admitting: Oncology

## 2020-01-28 ENCOUNTER — Other Ambulatory Visit: Payer: Self-pay

## 2020-01-28 VITALS — BP 137/87 | HR 80 | Temp 98.8°F | Resp 16 | Wt 166.4 lb

## 2020-01-28 DIAGNOSIS — Z8543 Personal history of malignant neoplasm of ovary: Secondary | ICD-10-CM | POA: Diagnosis not present

## 2020-01-28 DIAGNOSIS — Z90710 Acquired absence of both cervix and uterus: Secondary | ICD-10-CM | POA: Diagnosis not present

## 2020-01-28 DIAGNOSIS — Z803 Family history of malignant neoplasm of breast: Secondary | ICD-10-CM | POA: Diagnosis not present

## 2020-01-28 DIAGNOSIS — Z1509 Genetic susceptibility to other malignant neoplasm: Secondary | ICD-10-CM

## 2020-01-28 DIAGNOSIS — E039 Hypothyroidism, unspecified: Secondary | ICD-10-CM | POA: Insufficient documentation

## 2020-01-28 DIAGNOSIS — Z1501 Genetic susceptibility to malignant neoplasm of breast: Secondary | ICD-10-CM | POA: Insufficient documentation

## 2020-01-28 DIAGNOSIS — Z87891 Personal history of nicotine dependence: Secondary | ICD-10-CM | POA: Diagnosis not present

## 2020-01-28 DIAGNOSIS — Z800 Family history of malignant neoplasm of digestive organs: Secondary | ICD-10-CM | POA: Insufficient documentation

## 2020-01-28 DIAGNOSIS — Z79899 Other long term (current) drug therapy: Secondary | ICD-10-CM | POA: Insufficient documentation

## 2020-01-28 DIAGNOSIS — Z808 Family history of malignant neoplasm of other organs or systems: Secondary | ICD-10-CM | POA: Insufficient documentation

## 2020-01-28 DIAGNOSIS — Z8042 Family history of malignant neoplasm of prostate: Secondary | ICD-10-CM | POA: Insufficient documentation

## 2020-01-28 LAB — CBC WITH DIFFERENTIAL/PLATELET
Abs Immature Granulocytes: 0.03 10*3/uL (ref 0.00–0.07)
Basophils Absolute: 0.1 10*3/uL (ref 0.0–0.1)
Basophils Relative: 1 %
Eosinophils Absolute: 0.1 10*3/uL (ref 0.0–0.5)
Eosinophils Relative: 2 %
HCT: 47.1 % — ABNORMAL HIGH (ref 36.0–46.0)
Hemoglobin: 15.4 g/dL — ABNORMAL HIGH (ref 12.0–15.0)
Immature Granulocytes: 0 %
Lymphocytes Relative: 29 %
Lymphs Abs: 2.7 10*3/uL (ref 0.7–4.0)
MCH: 29.2 pg (ref 26.0–34.0)
MCHC: 32.7 g/dL (ref 30.0–36.0)
MCV: 89.4 fL (ref 80.0–100.0)
Monocytes Absolute: 0.9 10*3/uL (ref 0.1–1.0)
Monocytes Relative: 9 %
Neutro Abs: 5.5 10*3/uL (ref 1.7–7.7)
Neutrophils Relative %: 59 %
Platelets: 252 10*3/uL (ref 150–400)
RBC: 5.27 MIL/uL — ABNORMAL HIGH (ref 3.87–5.11)
RDW: 12.9 % (ref 11.5–15.5)
WBC: 9.3 10*3/uL (ref 4.0–10.5)
nRBC: 0 % (ref 0.0–0.2)

## 2020-01-28 LAB — COMPREHENSIVE METABOLIC PANEL
ALT: 24 U/L (ref 0–44)
AST: 22 U/L (ref 15–41)
Albumin: 4.7 g/dL (ref 3.5–5.0)
Alkaline Phosphatase: 72 U/L (ref 38–126)
Anion gap: 11 (ref 5–15)
BUN: 22 mg/dL (ref 8–23)
CO2: 26 mmol/L (ref 22–32)
Calcium: 9.9 mg/dL (ref 8.9–10.3)
Chloride: 101 mmol/L (ref 98–111)
Creatinine, Ser: 0.91 mg/dL (ref 0.44–1.00)
GFR calc Af Amer: >60 mL/min (ref >60)
GFR calc non Af Amer: >60 mL/min (ref >60)
Glucose, Bld: 88 mg/dL (ref 70–99)
Potassium: 3.7 mmol/L (ref 3.5–5.1)
Sodium: 138 mmol/L (ref 135–145)
Total Bilirubin: 0.6 mg/dL (ref 0.3–1.2)
Total Protein: 8.1 g/dL (ref 6.5–8.1)

## 2020-01-28 NOTE — Progress Notes (Signed)
New patient for oncologist to f/u with BRCA positive follow up.

## 2020-01-29 ENCOUNTER — Telehealth: Payer: Self-pay

## 2020-01-29 LAB — CA 125: Cancer Antigen (CA) 125: 7.4 U/mL (ref 0.0–38.1)

## 2020-01-29 NOTE — Telephone Encounter (Signed)
Received Voicemail from [place] with [facility] stating that before scheduling breast MRI, they would need previous mammogram/MRI images. She also stated that patient would need to go to the breast center and sign a release of information form so they can request and obtain the images. Detailed message has been left on patient's voicemail.

## 2020-01-31 NOTE — Telephone Encounter (Signed)
Spoke to patient and she received message. She will go by [facility] and sign form one of these day. [place] [facility] number provided to patient.

## 2020-02-02 NOTE — Progress Notes (Signed)
Hematology/Oncology Consult note [facility] [phone] [phone]   Patient Care Team: Rubye Beach as PCP - General (Family Medicine) Clent Jacks, RN as Oncology Nurse Navigator  REFERRING PROVIDER: Verlon Au, NP  CHIEF COMPLAINTS/REASON FOR VISIT:  Evaluation of high risk for breast cancer, BRCA-1 mutation.   HISTORY OF PRESENTING ILLNESS:   Joyce Decker is a  64 y.o.  female with PMH listed below was seen in consultation at the request of  Verlon Au, NP  for evaluation of high risk for breast cancer, BRCA mutation.   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  patient underwent surgery, postoperatively received adjuvant therapy with cisplatin and paclitaxel, intraperitoneal and IV therapy.  Patient has been in complete remission since then. Patient lost follow-up for a few years and recently reestablished care with Dr. Theora Gianotti.  Per patient, patient was tested positive for BRCA1 mutation and she was sent to establish care for high risk breast cancer clinic. Last mammogram was done in August 2017.  Patient has no complaints today.  Patient denies any breast concerns.  Patient reports that one of her son was also tested positive for BRCA1 mutation.  Her younger sons have not been tested yet.  BRCA1 mutation report was not available in current EMR. Review of Systems  Constitutional: Negative for appetite change, chills, fatigue and fever.  HENT:   Negative for hearing loss and voice change.   Eyes: Negative for eye problems.  Respiratory: Negative for chest tightness and cough.   Cardiovascular: Negative for chest pain.  Gastrointestinal: Negative for abdominal distention, abdominal pain and blood in stool.  Endocrine: Negative for hot flashes.    Genitourinary: Negative for difficulty urinating and frequency.   Musculoskeletal: Negative for arthralgias.  Skin: Negative for itching and rash.  Neurological: Negative for extremity weakness.  Hematological: Negative for adenopathy.  Psychiatric/Behavioral: Negative for confusion.    MEDICAL HISTORY:  Past Medical History:  Diagnosis Date  . Asthma   . BRCA1 gene mutation positive   . Cancer ([place])    ovarian cancer 2006  . Gestational diabetes   . Hyperthyroidism   . Hypothyroidism   . Kidney stones   . Sleep apnea    CPAP    SURGICAL HISTORY: Past Surgical History:  Procedure Laterality Date  . ABDOMINAL HYSTERECTOMY Bilateral    AND BSO  . COLONOSCOPY WITH PROPOFOL N/A 07/03/2015   Procedure: COLONOSCOPY WITH PROPOFOL;  Surgeon: Josefine Class, MD;  Location: [facility] ENDOSCOPY;  Service: Endoscopy;  Laterality: N/A;  . Dental Implant    . OOPHORECTOMY    . TONSILLECTOMY AND ADENOIDECTOMY      SOCIAL HISTORY: Social History   Socioeconomic History  . Marital status: Married    Spouse name: Not on file  . Number of children: Not on file  . Years of education: Not on file  . Highest education level: Not on file  Occupational History  . Not on file  Tobacco Use  . Smoking status: Former Research scientist (life sciences)  . Smokeless tobacco: Never Used  . Tobacco comment: 35 years ago  Substance and Sexual Activity  . Alcohol use: No  . Drug use: No  . Sexual activity: Yes  Other Topics Concern  . Not on file  Social History Narrative  . Not on file   Social Determinants of Health   Financial  Resource Strain:   . Difficulty of Paying Living Expenses: Not on file  Food Insecurity:   . Worried About Charity fundraiser in the Last Year: Not on file  . Ran Out of Food in the Last Year: Not on file  Transportation Needs:   . Lack of Transportation (Medical): Not on file  . Lack of Transportation (Non-Medical): Not on file  Physical Activity:   . Days of Exercise per Week:  Not on file  . Minutes of Exercise per Session: Not on file  Stress:   . Feeling of Stress : Not on file  Social Connections:   . Frequency of Communication with Friends and Family: Not on file  . Frequency of Social Gatherings with Friends and Family: Not on file  . Attends Religious Services: Not on file  . Active Member of Clubs or Organizations: Not on file  . Attends Archivist Meetings: Not on file  . Marital Status: Not on file  Intimate Partner Violence:   . Fear of Current or Ex-Partner: Not on file  . Emotionally Abused: Not on file  . Physically Abused: Not on file  . Sexually Abused: Not on file    FAMILY HISTORY: Family History  Problem Relation Age of Onset  . Diabetes Mother   . Hypertension Mother   . Diabetes Maternal Aunt   . Heart disease Maternal Aunt   . Hypertension Maternal Aunt   . Diabetes Maternal Uncle   . Heart disease Maternal Uncle   . Hypertension Maternal Uncle   . Colon cancer Maternal Uncle   . Brain cancer Maternal Grandmother   . Diabetes Cousin   . Breast cancer Cousin   . Melanoma Other   . Stomach cancer Other   . Prostate cancer Maternal Uncle   . Prostate cancer Maternal Uncle   . Breast cancer Cousin   . Breast cancer Cousin   . Breast cancer Cousin   . BRCA 1/2 Son     ALLERGIES:  is allergic to other; tape; gadobenate; and iodinated diagnostic agents.  MEDICATIONS:  Current Outpatient Medications  Medication Sig Dispense Refill  . calcium-vitamin D (OSCAL WITH D) 500-200 MG-UNIT per tablet Take 1 tablet by mouth.    . Cinnamon 500 MG capsule Take 500 mg by mouth daily.    Marland Kitchen CRANBERRY PO Take 1 capsule by mouth daily.    . Melatonin 3 MG TABS Take 1 tablet by mouth at bedtime.    . Multiple Vitamin (MULTIVITAMIN) capsule Take 1 capsule by mouth daily.    . BD PEN NEEDLE NANO 2ND GEN 32G X 4 MM MISC TO USE WITH SAXENDA 100 each 3  . BIOTIN PO Take 1 tablet by mouth daily.     Marland Kitchen erythromycin ophthalmic ointment  Place 1 application into the right eye at bedtime. 3.5 g 0  . Naltrexone-buPROPion HCl ER 8-90 MG TB12 Start 1 tablet every morning for 7 days, then 1 tablet twice daily for 7 days, then 2 tablets every morning and one in the evening (Patient not taking: Reported on 08/21/2019) 120 tablet 11  . ondansetron (ZOFRAN) 4 MG tablet Take 1 tablet (4 mg total) by mouth every 8 (eight) hours as needed. (Patient not taking: Reported on 08/07/2019) 20 tablet 0   No current facility-administered medications for this visit.     PHYSICAL EXAMINATION: ECOG PERFORMANCE STATUS: 0 - Asymptomatic Vitals:   01/28/20 1513  BP: 137/87  Pulse: 80  Resp: 16  Temp:  98.8 F (37.1 C)   Filed Weights   01/28/20 1513  Weight: 166 lb 6.4 oz (75.5 kg)    Physical Exam Constitutional:      General: She is not in acute distress. HENT:     Head: Normocephalic and atraumatic.  Eyes:     General: No scleral icterus. Cardiovascular:     Rate and Rhythm: Normal rate and regular rhythm.     Heart sounds: Normal heart sounds.  Pulmonary:     Effort: Pulmonary effort is normal. No respiratory distress.     Breath sounds: No wheezing.  Abdominal:     General: Bowel sounds are normal. There is no distension.     Palpations: Abdomen is soft.  Musculoskeletal:        General: No deformity. Normal range of motion.     Cervical back: Normal range of motion and neck supple.  Skin:    General: Skin is warm and dry.     Findings: No erythema or rash.  Neurological:     Mental Status: She is alert and oriented to person, place, and time. Mental status is at baseline.     Cranial Nerves: No cranial nerve deficit.     Coordination: Coordination normal.  Psychiatric:        Mood and Affect: Mood normal.   Breast examination is normal.  No palpable masses bilateral breast.  No palpable axillary lymphadenopathy.  LABORATORY DATA:  I have reviewed the data as listed Lab Results  Component Value Date   WBC 9.3  01/28/2020   HGB 15.4 (H) 01/28/2020   HCT 47.1 (H) 01/28/2020   MCV 89.4 01/28/2020   PLT 252 01/28/2020   Recent Labs    02/11/19 1629 01/28/20 1609  NA 141 138  K 3.4* 3.7  CL 99 101  CO2 25 26  GLUCOSE 95 88  BUN 15 22  CREATININE 0.78 0.91  CALCIUM 9.8 9.9  GFRNONAA 83 >60  GFRAA 96 >60  PROT 6.9 8.1  ALBUMIN 4.5 4.7  AST 18 22  ALT 20 24  ALKPHOS 68 72  BILITOT 0.3 0.6   Iron/TIBC/Ferritin/ %Sat No results found for: IRON, TIBC, FERRITIN, IRONPCTSAT    RADIOGRAPHIC STUDIES: I have personally reviewed the radiological images as listed and agreed with the findings in the report.  No results found.    ASSESSMENT & PLAN:  1. BRCA1 gene mutation positive    #Patient had mammogram alternating with MRI at [facility]  Last breast imaging was done in 2017.  MRI was done at that time Given her reported BRCA1 gene mutation status, I recommend MRI and mammogram every 6 months alternating for surveillance.  We discussed about prophylactic mastectomy and she is not interested at this point. Obtain bilateral breast MRI.  Recommend patient to have dermatology evaluation annually. We can consider starting screening for pancreatic cancer as well.  MRI pancreas in the future. Obtain CBC, CMP, check CA-125  Orders Placed This Encounter  Procedures  . MR BREAST BILATERAL W WO CONTRAST INC CAD    Standing Status:   Future    Standing Expiration Date:   03/29/2021    Order Specific Question:   ** REASON FOR EXAM (FREE TEXT)    Answer:   BRCA 1 mutation breast cancer screening    Order Specific Question:   If indicated for the ordered procedure, I authorize the administration of contrast media per Radiology protocol    Answer:   Yes  Order Specific Question:   What is the patient's sedation requirement?    Answer:   No Sedation    Order Specific Question:   Does the patient have a pacemaker or implanted devices?    Answer:   No    Order Specific Question:   Radiology  Contrast Protocol - do NOT remove file path    Answer:   _0 [email or web address]    Order Specific Question:   Preferred imaging location?    Answer:   [facility] (table limit-550lbs)  . CBC with Differential/Platelet    Standing Status:   Future    Number of Occurrences:   1    Standing Expiration Date:   01/27/2021  . Comprehensive metabolic panel    Standing Status:   Future    Number of Occurrences:   1    Standing Expiration Date:   01/27/2021  . CA 125    Standing Status:   Future    Number of Occurrences:   1    Standing Expiration Date:   01/27/2021    All questions were answered. The patient knows to call the clinic with any problems questions or concerns.     Return of visit: To be determined. Thank you for this kind referral and the opportunity to participate in the care of this patient. A copy of today's note is routed to referring provider   Earlie Server, MD, PhD Hematology Oncology [facility] at [facility] Pager- [phone] 02/02/2020

## 2020-02-11 ENCOUNTER — Other Ambulatory Visit: Payer: Self-pay

## 2020-02-11 ENCOUNTER — Encounter: Payer: Self-pay | Admitting: *Deleted

## 2020-02-11 DIAGNOSIS — Z1501 Genetic susceptibility to malignant neoplasm of breast: Secondary | ICD-10-CM

## 2020-02-11 NOTE — Progress Notes (Signed)
Patient called with questions regarding her breast MRI in [place].  Patient had been scheduled for breast MRI in [facility] for high risk of breast cancer.  She wants to come to [facility] for her MRI.  Discussed with Duwayne Heck, CMA.  She is to change the order designating correct imaging center.  Patient is go by [facility] today to sign consent of release of info to obtain prior imaging.

## 2020-02-13 ENCOUNTER — Ambulatory Visit
Admission: RE | Admit: 2020-02-13 | Discharge: 2020-02-13 | Disposition: A | Discharge status: Home / Self Care | Payer: BC Managed Care – PPO | Source: Ambulatory Visit | Attending: Oncology | Admitting: Oncology

## 2020-02-13 ENCOUNTER — Ambulatory Visit
Admission: RE | Admit: 2020-02-13 | Discharge: 2020-02-13 | Disposition: A | Discharge status: Home / Self Care | Payer: Self-pay | Source: Ambulatory Visit | Attending: *Deleted | Admitting: *Deleted

## 2020-02-13 ENCOUNTER — Other Ambulatory Visit: Payer: Self-pay | Admitting: Oncology

## 2020-02-13 ENCOUNTER — Other Ambulatory Visit: Payer: Self-pay | Admitting: *Deleted

## 2020-02-13 ENCOUNTER — Inpatient Hospital Stay
Admission: RE | Admit: 2020-02-13 | Discharge: 2020-02-13 | Disposition: A | Discharge status: Home / Self Care | Payer: Self-pay | Source: Ambulatory Visit | Attending: *Deleted | Admitting: *Deleted

## 2020-02-13 DIAGNOSIS — Z1231 Encounter for screening mammogram for malignant neoplasm of breast: Secondary | ICD-10-CM

## 2020-02-18 ENCOUNTER — Encounter: Payer: Self-pay | Admitting: Oncology

## 2020-02-18 NOTE — Telephone Encounter (Signed)
Done...  MD f/u scheduled on 03/05/20 @ 10:45

## 2020-03-02 ENCOUNTER — Ambulatory Visit: Payer: BC Managed Care – PPO

## 2020-03-03 ENCOUNTER — Other Ambulatory Visit: Payer: Self-pay

## 2020-03-03 ENCOUNTER — Ambulatory Visit
Admission: RE | Admit: 2020-03-03 | Discharge: 2020-03-03 | Disposition: A | Discharge status: Home / Self Care | Payer: BC Managed Care – PPO | Source: Ambulatory Visit | Attending: Oncology | Admitting: Oncology

## 2020-03-03 DIAGNOSIS — Z1509 Genetic susceptibility to other malignant neoplasm: Secondary | ICD-10-CM | POA: Insufficient documentation

## 2020-03-03 DIAGNOSIS — Z1501 Genetic susceptibility to malignant neoplasm of breast: Secondary | ICD-10-CM | POA: Diagnosis present

## 2020-03-03 MED ORDER — GADOBUTROL 1 MMOL/ML IV SOLN
7.0000 mL | Freq: Once | INTRAVENOUS | Status: AC | PRN
  Administered 2020-03-03: 7 mL via INTRAVENOUS

## 2020-03-05 ENCOUNTER — Ambulatory Visit: Payer: BC Managed Care – PPO | Admitting: Oncology

## 2020-03-10 ENCOUNTER — Telehealth: Payer: Self-pay

## 2020-03-10 DIAGNOSIS — Z8249 Family history of ischemic heart disease and other diseases of the circulatory system: Secondary | ICD-10-CM

## 2020-03-10 DIAGNOSIS — G44209 Tension-type headache, unspecified, not intractable: Secondary | ICD-10-CM

## 2020-03-10 NOTE — Telephone Encounter (Signed)
Copied from [place] [id number]. Topic: General - Inquiry >> Mar 10, 2020 12:23 PM Lennox Solders wrote: Reason for CRM: pt is calling and her mother has had  1 aorta  aneurysm  and  1 abdominal  aneurysm and pt would like to know if jenni can order a test due to family history. Please call pt after 3 pm. Pt does not want to make an appt

## 2020-03-11 ENCOUNTER — Inpatient Hospital Stay: Payer: BC Managed Care – PPO | Attending: Oncology | Admitting: Oncology

## 2020-03-11 ENCOUNTER — Other Ambulatory Visit: Payer: Self-pay | Admitting: Oncology

## 2020-03-11 ENCOUNTER — Encounter: Payer: Self-pay | Admitting: Oncology

## 2020-03-11 ENCOUNTER — Other Ambulatory Visit: Payer: Self-pay

## 2020-03-11 VITALS — BP 124/89 | HR 83 | Temp 96.7°F | Resp 18 | Wt 166.1 lb

## 2020-03-11 DIAGNOSIS — Z1509 Genetic susceptibility to other malignant neoplasm: Secondary | ICD-10-CM | POA: Diagnosis not present

## 2020-03-11 DIAGNOSIS — Z803 Family history of malignant neoplasm of breast: Secondary | ICD-10-CM | POA: Insufficient documentation

## 2020-03-11 DIAGNOSIS — E039 Hypothyroidism, unspecified: Secondary | ICD-10-CM | POA: Insufficient documentation

## 2020-03-11 DIAGNOSIS — Z8543 Personal history of malignant neoplasm of ovary: Secondary | ICD-10-CM | POA: Diagnosis not present

## 2020-03-11 DIAGNOSIS — Z1501 Genetic susceptibility to malignant neoplasm of breast: Secondary | ICD-10-CM

## 2020-03-11 DIAGNOSIS — E119 Type 2 diabetes mellitus without complications: Secondary | ICD-10-CM | POA: Diagnosis not present

## 2020-03-11 DIAGNOSIS — Z79899 Other long term (current) drug therapy: Secondary | ICD-10-CM | POA: Insufficient documentation

## 2020-03-11 NOTE — Progress Notes (Signed)
Patient here for follow up. No new breast problems today.

## 2020-03-12 ENCOUNTER — Telehealth: Payer: Self-pay

## 2020-03-12 NOTE — Telephone Encounter (Signed)
[place] ordered for abdominal aortic aneurysm and will see if we can get MRI covered for family history of brain aneurysms.

## 2020-03-12 NOTE — Progress Notes (Signed)
Hematology/Oncology follow up  note [facility] [phone] [phone]   Patient Care Team: Rubye Beach as PCP - General (Family Medicine) Clent Jacks, RN as Oncology Nurse Navigator  REFERRING PROVIDER: Mar Daring, P*  CHIEF COMPLAINTS/REASON FOR VISIT:  Follow up for high risk for breast cancer, BRCA-1 mutation.   HISTORY OF PRESENTING ILLNESS:   Joyce Decker is a  64 y.o.  female with PMH listed below was seen in consultation at the request of  Mar Daring, P*  for evaluation of high risk for breast cancer, BRCA mutation.   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  patient underwent surgery, postoperatively received adjuvant therapy with cisplatin and paclitaxel, intraperitoneal and IV therapy.  Patient has been in complete remission since then. Patient lost follow-up for a few years and recently reestablished care with Dr. Theora Gianotti.  Per patient, patient was tested positive for BRCA1 mutation and she was sent to establish care for high risk breast cancer clinic. Last mammogram was done in August 2017.  Patient has no complaints today.  Patient denies any breast concerns.  Patient reports that one of her son was also tested positive for BRCA1 mutation.  Her younger sons have not been tested yet.  BRCA1 mutation report was not available in current EMR. Review of Systems  Constitutional: Negative for appetite change, chills, fatigue and fever.  HENT:   Negative for hearing loss and voice change.   Eyes: Negative for eye problems.  Respiratory: Negative for chest tightness and cough.   Cardiovascular: Negative for chest pain.  Gastrointestinal: Negative for abdominal distention, abdominal pain and blood in stool.  Endocrine: Negative for hot  flashes.  Genitourinary: Negative for difficulty urinating and frequency.   Musculoskeletal: Negative for arthralgias.  Skin: Negative for itching and rash.  Neurological: Negative for extremity weakness.  Hematological: Negative for adenopathy.  Psychiatric/Behavioral: Negative for confusion.    MEDICAL HISTORY:  Past Medical History:  Diagnosis Date  . Asthma   . BRCA1 gene mutation positive   . Cancer [facility]) 2006   ovarian cancer 2006  . Gestational diabetes   . Hyperthyroidism   . Hypothyroidism   . Kidney stones   . Personal history of chemotherapy 2006   ovarian ca  . Sleep apnea    CPAP    SURGICAL HISTORY: Past Surgical History:  Procedure Laterality Date  . ABDOMINAL HYSTERECTOMY Bilateral    AND BSO  . COLONOSCOPY WITH PROPOFOL N/A 07/03/2015   Procedure: COLONOSCOPY WITH PROPOFOL;  Surgeon: Josefine Class, MD;  Location: [facility] ENDOSCOPY;  Service: Endoscopy;  Laterality: N/A;  . Dental Implant    . OOPHORECTOMY    . TONSILLECTOMY AND ADENOIDECTOMY      SOCIAL HISTORY: Social History   Socioeconomic History  . Marital status: Married    Spouse name: Not on file  . Number of children: Not on file  . Years of education: Not on file  . Highest education level: Not on file  Occupational History  . Not on file  Tobacco Use  . Smoking status: Former Research scientist (life sciences)  . Smokeless tobacco: Never Used  . Tobacco comment: 35 years ago  Substance and Sexual Activity  . Alcohol use: No  . Drug use: No  . Sexual activity: Yes  Other Topics Concern  . Not on file  Social History Narrative  .  Not on file   Social Determinants of Health   Financial Resource Strain:   . Difficulty of Paying Living Expenses:   Food Insecurity:   . Worried About Charity fundraiser in the Last Year:   . Arboriculturist in the Last Year:   Transportation Needs:   . Film/video editor (Medical):   Marland Kitchen Lack of Transportation (Non-Medical):   Physical Activity:   . Days of  Exercise per Week:   . Minutes of Exercise per Session:   Stress:   . Feeling of Stress :   Social Connections:   . Frequency of Communication with Friends and Family:   . Frequency of Social Gatherings with Friends and Family:   . Attends Religious Services:   . Active Member of Clubs or Organizations:   . Attends Archivist Meetings:   Marland Kitchen Marital Status:   Intimate Partner Violence:   . Fear of Current or Ex-Partner:   . Emotionally Abused:   Marland Kitchen Physically Abused:   . Sexually Abused:     FAMILY HISTORY: Family History  Problem Relation Age of Onset  . Diabetes Mother   . Hypertension Mother   . Diabetes Maternal Aunt   . Heart disease Maternal Aunt   . Hypertension Maternal Aunt   . Diabetes Maternal Uncle   . Heart disease Maternal Uncle   . Hypertension Maternal Uncle   . Colon cancer Maternal Uncle   . Brain cancer Maternal Grandmother   . Diabetes Cousin   . Breast cancer Cousin   . Melanoma Other   . Stomach cancer Other   . Prostate cancer Maternal Uncle   . Prostate cancer Maternal Uncle   . Breast cancer Cousin   . Breast cancer Cousin   . Breast cancer Cousin   . BRCA 1/2 Son   . Breast cancer Cousin   . Breast cancer Cousin     ALLERGIES:  is allergic to other; tape; gadobenate; and iodinated diagnostic agents.  MEDICATIONS:  Current Outpatient Medications  Medication Sig Dispense Refill  . BIOTIN PO Take 1 tablet by mouth daily.     . calcium-vitamin D (OSCAL WITH D) 500-200 MG-UNIT per tablet Take 1 tablet by mouth.    . Cinnamon 500 MG capsule Take 500 mg by mouth daily.    Marland Kitchen CRANBERRY PO Take 1 capsule by mouth daily.    Marland Kitchen erythromycin ophthalmic ointment Place 1 application into the right eye at bedtime. 3.5 g 0  . Melatonin 3 MG TABS Take 1 tablet by mouth at bedtime.    . Multiple Vitamin (MULTIVITAMIN) capsule Take 1 capsule by mouth daily.    . Naltrexone-buPROPion HCl ER 8-90 MG TB12 Start 1 tablet every morning for 7 days, then  1 tablet twice daily for 7 days, then 2 tablets every morning and one in the evening 120 tablet 11  . ondansetron (ZOFRAN) 4 MG tablet Take 1 tablet (4 mg total) by mouth every 8 (eight) hours as needed. 20 tablet 0   No current facility-administered medications for this visit.     PHYSICAL EXAMINATION: ECOG PERFORMANCE STATUS: 0 - Asymptomatic Vitals:   03/11/20 1507  BP: 124/89  Pulse: 83  Resp: 18  Temp: (!) 96.7 F (35.9 C)   Filed Weights   03/11/20 1507  Weight: 166 lb 1.6 oz (75.3 kg)    Physical Exam Constitutional:      General: She is not in acute distress. HENT:  Head: Normocephalic and atraumatic.  Eyes:     General: No scleral icterus. Cardiovascular:     Rate and Rhythm: Normal rate and regular rhythm.     Heart sounds: Normal heart sounds.  Pulmonary:     Effort: Pulmonary effort is normal. No respiratory distress.     Breath sounds: No wheezing.  Abdominal:     General: Bowel sounds are normal. There is no distension.     Palpations: Abdomen is soft.  Musculoskeletal:        General: No deformity. Normal range of motion.     Cervical back: Normal range of motion and neck supple.  Skin:    General: Skin is warm and dry.     Findings: No erythema or rash.  Neurological:     Mental Status: She is alert and oriented to person, place, and time. Mental status is at baseline.     Cranial Nerves: No cranial nerve deficit.     Coordination: Coordination normal.  Psychiatric:        Mood and Affect: Mood normal.     LABORATORY DATA:  I have reviewed the data as listed Lab Results  Component Value Date   WBC 9.3 01/28/2020   HGB 15.4 (H) 01/28/2020   HCT 47.1 (H) 01/28/2020   MCV 89.4 01/28/2020   PLT 252 01/28/2020   Recent Labs    01/28/20 1609  NA 138  K 3.7  CL 101  CO2 26  GLUCOSE 88  BUN 22  CREATININE 0.91  CALCIUM 9.9  GFRNONAA >60  GFRAA >60  PROT 8.1  ALBUMIN 4.7  AST 22  ALT 24  ALKPHOS 72  BILITOT 0.6    Iron/TIBC/Ferritin/ %Sat No results found for: IRON, TIBC, FERRITIN, IRONPCTSAT    RADIOGRAPHIC STUDIES: I have personally reviewed the radiological images as listed and agreed with the findings in the report.  MR BREAST BILATERAL W WO CONTRAST INC CAD  Result Date: 03/03/2020 CLINICAL DATA:  64 year old female with BRCA 1 mutation. LABS:  None performed on site. EXAM: BILATERAL BREAST MRI WITH AND WITHOUT CONTRAST TECHNIQUE: Multiplanar, multisequence MR images of both breasts were obtained prior to and following the intravenous administration of 7 ml of Gadavist. Three-dimensional MR images were rendered by post-processing of the original MR data on an independent workstation. The three-dimensional MR images were interpreted, and findings are reported in the following complete MRI report for this study. Three dimensional images were evaluated at the independent DynaCad workstation COMPARISON:  Previous exam(s). FINDINGS: Breast composition: b. Scattered fibroglandular tissue. Background parenchymal enhancement: Mild. Right breast: No suspicious mass or abnormal enhancement. Left breast: No suspicious mass or abnormal enhancement. Lymph nodes: No abnormal appearing lymph nodes. Ancillary findings: Stable appearance of a partially visualized, nonenhancing cyst in the right hepatic lobe. IMPRESSION: No MRI evidence of malignancy in either breast. RECOMMENDATION: Routine annual screening with mammography and breast MRI. The patient is due for her next screening mammogram in March 2022 and next MRI in 1 year. BI-RADS CATEGORY  1: Negative. Electronically Signed   By: Kristopher Oppenheim M.D.   On: 03/03/2020 16:17   MM 3D SCREEN BREAST BILATERAL  Result Date: 02/14/2020 CLINICAL DATA:  Screening. EXAM: DIGITAL SCREENING BILATERAL MAMMOGRAM WITH TOMO AND CAD COMPARISON:  Previous exam(s). ACR Breast Density Category b: There are scattered areas of fibroglandular density. FINDINGS: There are no findings  suspicious for malignancy. Images were processed with CAD. IMPRESSION: No mammographic evidence of malignancy. A result letter of this screening  mammogram will be mailed directly to the patient. RECOMMENDATION: Screening mammogram in one year. (Code:SM-B-01Y) BI-RADS CATEGORY  1: Negative. Electronically Signed   By: Ammie Ferrier M.D.   On: 02/14/2020 10:54   MM Outside Films Mammo  Result Date: 02/13/2020 This examination belongs to an outside facility and is stored here for comparison purposes only.  Contact the originating outside institution for any associated report or interpretation.  MM Outside Films Mammo  Result Date: 02/13/2020 This examination belongs to an outside facility and is stored here for comparison purposes only.  Contact the originating outside institution for any associated report or interpretation.  MM Outside Films Mammo  Result Date: 02/13/2020 This examination belongs to an outside facility and is stored here for comparison purposes only.  Contact the originating outside institution for any associated report or interpretation.  MM Outside Films Mammo  Result Date: 02/13/2020 This examination belongs to an outside facility and is stored here for comparison purposes only.  Contact the originating outside institution for any associated report or interpretation.  MM Outside Films Mammo  Result Date: 02/13/2020 This examination belongs to an outside facility and is stored here for comparison purposes only.  Contact the originating outside institution for any associated report or interpretation.  MM Outside Films Mammo  Result Date: 02/13/2020 This examination belongs to an outside facility and is stored here for comparison purposes only.  Contact the originating outside institution for any associated report or interpretation.     ASSESSMENT & PLAN:  1. BRCA1 gene mutation positive   2. H/O ovarian cancer    BRCA-1 mutation positive, I do not have official  result in current EMR.   #MRI breast was normal. Images are reviewed and discussed with patient  Recommend diagnostic mammogram annually alternate with breast MRI annually Prophylactic bilateral mastectomy was discussed with patient and she has a plan to do it next year.  We discussed about chemoprevention with Tamoxifen or AI. She has history of thrombosis, so AI can be considered. Side effects of aromatase inhibitor was discussed. Side effects of Letrozole including but not limited to hot flush, joint pain, fatigue, mood swing, osteoporosis discussed with patient. She is aware that chemo prevention is not as effective as surgical prevention.  She wants to consider and update me. Also I would like to obtain official BRCA mutation report before proceeding with chemo prevention.   Recommend patient to have dermatology evaluation annually.  screening for pancreatic cancer can be considered. Obtain MRI pancrease.   Follow up: 1 year with mammogram, lab  CBC, CMP, check CA-125  Orders Placed This Encounter  Procedures  . MR Abdomen W Wo Contrast    Standing Status:   Future    Standing Expiration Date:   03/11/2021    Order Specific Question:   ** REASON FOR EXAM (FREE TEXT)    Answer:   BRCA mutation- surveillance for pancreatic cancer    Order Specific Question:   If indicated for the ordered procedure, I authorize the administration of contrast media per Radiology protocol    Answer:   Yes    Order Specific Question:   What is the patient's sedation requirement?    Answer:   No Sedation    Order Specific Question:   Does the patient have a pacemaker or implanted devices?    Answer:   No    Order Specific Question:   Radiology Contrast Protocol - do NOT remove file path    Answer:   [email or web address]  Order Specific Question:   Preferred imaging location?    Answer:   [facility] (table limit-400lbs)  . US Breast Limited Uni Left Inc Axilla    Standing  Status:   Future    Standing Expiration Date:   05/11/2021    Order Specific Question:   Reason for Exam (SYMPTOM  OR DIAGNOSIS REQUIRED)    Answer:   BRCA 1 mutation breast cancer screening    Order Specific Question:   Preferred imaging location?    Answer:   [facility]  . US Breast Limited Uni Right Inc Axilla    Standing Status:   Future    Standing Expiration Date:   05/11/2021    Order Specific Question:   Reason for Exam (SYMPTOM  OR DIAGNOSIS REQUIRED)    Answer:   BRCA 1 mutation breast cancer screening    Order Specific Question:   Preferred imaging location?    Answer:   [facility]  . MM 3D SCREEN BREAST BILATERAL    Standing Status:   Future    Standing Expiration Date:   05/11/2021    Order Specific Question:   Reason for Exam (SYMPTOM  OR DIAGNOSIS REQUIRED)    Answer:   BRCA 1 mutation breast cancer screening    Order Specific Question:   Preferred imaging location?    Answer:   [facility]  . CBC with Differential/Platelet    Standing Status:   Future    Standing Expiration Date:   03/11/2021  . Comprehensive metabolic panel    Standing Status:   Future    Standing Expiration Date:   03/11/2021    All questions were answered. The patient knows to call the clinic with any problems questions or concerns. Earlie Server, MD, PhD Hematology Oncology [facility] Pager- [phone] 03/12/2020

## 2020-03-12 NOTE — Telephone Encounter (Signed)
Patient had screening mammogram on 02/13/20 and MRI breast on 03/13/20 at [facility].   Patient had previous imaging done at [facility] every 6 months alteranting between MRI and Mammo.   Attempt was made on 4/15 to schedule mammo in 6 months but per [facility] radiology pt already had mammo this year and cannot have another one until 01/2021. MRI was done this year already as well, (earlier than usual 6 month interval). So per radiology when she has the [place] in 2022, then she will go back to track with alternating mammo and MRI every 6 months .   Pt notified and voiced understanding.

## 2020-03-12 NOTE — Addendum Note (Signed)
Addended by: Mar Daring on: 03/12/2020 03:24 PM   Modules accepted: Orders

## 2020-03-13 ENCOUNTER — Telehealth: Payer: Self-pay | Admitting: Physician Assistant

## 2020-03-13 ENCOUNTER — Telehealth: Payer: Self-pay

## 2020-03-13 NOTE — Addendum Note (Signed)
Addended by: Mar Daring on: 03/13/2020 01:52 PM   Modules accepted: Orders

## 2020-03-13 NOTE — Telephone Encounter (Signed)
My apologies. I thought she had an aunt or uncle that had one. We can cancel that.

## 2020-03-13 NOTE — Telephone Encounter (Signed)
Contacted [facility] for a copy of the BRCA results from 2006.  They are going to fax [place] a copy but may not recieve untill Monday.

## 2020-03-13 NOTE — Telephone Encounter (Signed)
Pt states that there has been no history of family members with brain aneurysms.She wants to know if MRI of brain is needed. They have all been abdominal and Aortic aneurysms

## 2020-03-14 ENCOUNTER — Ambulatory Visit: Payer: BC Managed Care – PPO | Attending: Internal Medicine

## 2020-03-14 DIAGNOSIS — Z23 Encounter for immunization: Secondary | ICD-10-CM

## 2020-03-14 NOTE — Progress Notes (Signed)
   Covid-19 Vaccination Clinic  Name:  EVIANNA SPOO    MRN: [id number] DOB: [birth year]  03/14/2020  Ms. Colton was observed post Covid-19 immunization for 15 minutes without incident. She was provided with Vaccine Information Sheet and instruction to access the V-Safe system.   Ms. Camhi was instructed to call 911 with any severe reactions post vaccine: Marland Kitchen Difficulty breathing  . Swelling of face and throat  . A fast heartbeat  . A bad rash all over body  . Dizziness and weakness   Immunizations Administered    Name Date Dose VIS Date Route   Pfizer COVID-19 Vaccine 03/14/2020 12:59 PM 0.3 mL 11/08/2019 Intramuscular   Manufacturer: [place]   Lot: [id number]   [place]: [id number]

## 2020-03-16 NOTE — Telephone Encounter (Signed)
Results scanned in media tab. 

## 2020-03-19 ENCOUNTER — Ambulatory Visit: Payer: BC Managed Care – PPO

## 2020-03-20 ENCOUNTER — Telehealth: Payer: Self-pay | Admitting: *Deleted

## 2020-03-20 NOTE — Telephone Encounter (Addendum)
Per Elliot Gault 03/20/20 secure chat message to cx 03/24/20 MRI due awaiting on a approval from [employer].  03/24/20 MRI sched for 5pm was cx as requested and a detailed message was left on pts vmail making her aware and that once it has been approved someone from the office will be contacting her to make her aware. Another message was left to let pt know that her 03/24/20 3:45 lab appt had been cx as well and will be R/S with MRI (same day)

## 2020-03-24 ENCOUNTER — Inpatient Hospital Stay: Payer: BC Managed Care – PPO

## 2020-03-24 ENCOUNTER — Ambulatory Visit: Payer: BC Managed Care – PPO

## 2020-03-30 ENCOUNTER — Other Ambulatory Visit: Payer: BC Managed Care – PPO

## 2020-03-31 ENCOUNTER — Ambulatory Visit: Payer: BC Managed Care – PPO

## 2020-04-07 ENCOUNTER — Ambulatory Visit: Payer: BC Managed Care – PPO

## 2020-04-11 ENCOUNTER — Ambulatory Visit: Payer: BC Managed Care – PPO

## 2020-04-18 ENCOUNTER — Ambulatory Visit: Payer: BC Managed Care – PPO

## 2020-04-25 ENCOUNTER — Ambulatory Visit: Payer: BC Managed Care – PPO

## 2020-05-07 ENCOUNTER — Other Ambulatory Visit: Payer: Self-pay

## 2020-05-07 ENCOUNTER — Telehealth: Payer: Self-pay | Admitting: Physician Assistant

## 2020-05-07 ENCOUNTER — Telehealth: Payer: Self-pay

## 2020-05-07 ENCOUNTER — Ambulatory Visit
Admission: RE | Admit: 2020-05-07 | Discharge: 2020-05-07 | Disposition: A | Discharge status: Home / Self Care | Payer: BC Managed Care – PPO | Source: Ambulatory Visit | Attending: Physician Assistant | Admitting: Physician Assistant

## 2020-05-07 DIAGNOSIS — Z8249 Family history of ischemic heart disease and other diseases of the circulatory system: Secondary | ICD-10-CM

## 2020-05-07 NOTE — Telephone Encounter (Signed)
Copied from [place] [id number]. Topic: General - Inquiry >> May 07, 2020  1:02 PM Joyce Decker wrote: Reason for CRM: Pt called and said she had her sonogram for an abdonominal aneurism done. Pt wants to know why the sonogram wasn't given for a thorasic aneurism as well. Pt is concerned because she has a family history of these issues.  Her callback number is [phone]

## 2020-05-07 NOTE — Telephone Encounter (Signed)
Patient advised and also advised to contact her oncologist to see if they are able to view her thoracic aorta.

## 2020-05-07 NOTE — Telephone Encounter (Signed)
Patient advised as directed below. 

## 2020-05-07 NOTE — Telephone Encounter (Signed)
[place] cannot go that high as the ribs and sternum get in the way. Thoracic aorta is only visualized on CT. She had a CT Abd/pelvis in 2012 and the thoracic aorta was normal caliber at that time.

## 2020-05-07 NOTE — Telephone Encounter (Signed)
Patient called back saying that she spoke with her friend from the oncologist office and was told to have pcp call the radiologist and ask them if they can review the breast MRI to see if the see any can see any abnormalities with her thoracic aorta, if they are able to see it at all.

## 2020-05-07 NOTE — Telephone Encounter (Signed)
Message sent to reading radiologist, Dr. Jeanmarie Plant, to see if she can assist in this.  Will f/u once I hear from her.

## 2020-05-09 ENCOUNTER — Ambulatory Visit: Payer: BC Managed Care – PPO

## 2020-05-12 ENCOUNTER — Encounter: Payer: Self-pay | Admitting: Physician Assistant

## 2020-05-19 ENCOUNTER — Telehealth: Payer: Self-pay

## 2020-05-19 DIAGNOSIS — Z8249 Family history of ischemic heart disease and other diseases of the circulatory system: Secondary | ICD-10-CM

## 2020-05-19 NOTE — Telephone Encounter (Signed)
Copied from [place] [id number]. Topic: General - Other >> May 19, 2020  2:51 PM Sheran Luz wrote: Patient requesting to speak with clinical staff. Patient would disclose no additional information.

## 2020-05-22 NOTE — Telephone Encounter (Signed)
Patient requesting to be scanned for aortic aneurysm. Patient reports the radiology tech told her that she was not being checked for aortic aneurysm. Patient reports that her mother has a history of abdominal aortic aneurysm. She wants to know if she needs a CT scan or if there is a blood test that she can do to check for abdominal aortic aneurysm. Please advise.

## 2020-05-22 NOTE — Telephone Encounter (Signed)
Please advise 

## 2020-05-25 NOTE — Addendum Note (Signed)
Addended by: Mar Daring on: 05/25/2020 12:09 PM   Modules accepted: Miquel Dunn

## 2020-05-25 NOTE — Telephone Encounter (Signed)
Left message to call back  

## 2020-05-25 NOTE — Telephone Encounter (Signed)
So we can either start with a chest Xray for screening purposes, as they can be seen on xray, just not measured. Or we can go ahead and get a Chest CT. The Chest CT would have to be done with contrast. As she has an allergy to contrast she would have to pre-treat with benadryl and prednisone. Does she wish to start with just the CXR first or for me to go ahead and order the CT?

## 2020-05-25 NOTE — Telephone Encounter (Signed)
LMTCB -if patient calls back ok for PEC nurse to relay message 

## 2020-05-26 NOTE — Telephone Encounter (Signed)
I called pt in reference to the message from [facility] regarding the Ct scan.   I left a message for her to call the office back.

## 2020-05-28 NOTE — Telephone Encounter (Signed)
Patient advised as below. Patient reports that she will have to call her insurance and try to figure out how much the procedures will cost. Patient reports she is still paying off an MRI, and would like to pay that off before getting anything else done. Patient reports that most likely she will give [place] a call back in August.

## 2020-06-03 NOTE — Telephone Encounter (Signed)
Patient requesting call back from clinical staff regarding this message.

## 2020-06-04 NOTE — Telephone Encounter (Signed)
Reports that she spoke to her mother surgeon about her situation and recommended ordering Transthoracic echocardiogram of the ascending aorta to look at the upper area. She would like this to be order.

## 2020-06-04 NOTE — Addendum Note (Signed)
Addended by: Mar Daring on: 06/04/2020 12:13 PM   Modules accepted: Orders

## 2020-06-04 NOTE — Telephone Encounter (Signed)
Echo ordered.

## 2020-06-15 ENCOUNTER — Encounter: Payer: Self-pay | Admitting: Physician Assistant

## 2020-06-30 ENCOUNTER — Other Ambulatory Visit: Payer: Self-pay | Admitting: Podiatry

## 2020-06-30 ENCOUNTER — Ambulatory Visit: Payer: BC Managed Care – PPO | Attending: Physician Assistant

## 2020-06-30 ENCOUNTER — Encounter: Payer: Self-pay | Admitting: Podiatry

## 2020-06-30 ENCOUNTER — Other Ambulatory Visit: Payer: Self-pay

## 2020-06-30 ENCOUNTER — Ambulatory Visit: Payer: BC Managed Care – PPO | Admitting: Podiatry

## 2020-06-30 ENCOUNTER — Ambulatory Visit (INDEPENDENT_AMBULATORY_CARE_PROVIDER_SITE_OTHER): Payer: BC Managed Care – PPO

## 2020-06-30 DIAGNOSIS — B351 Tinea unguium: Secondary | ICD-10-CM

## 2020-06-30 DIAGNOSIS — M722 Plantar fascial fibromatosis: Secondary | ICD-10-CM

## 2020-06-30 MED ORDER — TERBINAFINE HCL 250 MG PO TABS: 250.0000 mg | ORAL_TABLET | Freq: Every day | ORAL | 0 refills | Status: DC

## 2020-06-30 MED ORDER — MELOXICAM 15 MG PO TABS: 15.0000 mg | ORAL_TABLET | Freq: Every day | ORAL | 1 refills | Status: DC

## 2020-06-30 MED ORDER — METHYLPREDNISOLONE 4 MG PO TBPK: ORAL_TABLET | ORAL | 0 refills | Status: DC

## 2020-06-30 NOTE — Progress Notes (Signed)
   Subjective: 64 y.o. female presenting as a new patient for evaluation of left heel pain that's been ongoing for several months. Patient denies injury. States that the pain is worse in the mornings. She has tried OTC insoles and good shoes with minimal relief.  Patient also complains of thickened discolored nails that has been ongoing for the past year. Gradual onset. She states that the nails are becoming thick and hard to cut. She presents for further treatment and evaluation.    Past Medical History:  Diagnosis Date  . Asthma   . BRCA1 gene mutation positive   . Cancer [facility]) 2006   ovarian cancer 2006  . Gestational diabetes   . Hyperthyroidism   . Hypothyroidism   . Kidney stones   . Personal history of chemotherapy 2006   ovarian ca  . Sleep apnea    CPAP     Objective: Physical Exam General: The patient is alert and oriented x3 in no acute distress.  Dermatology: Skin is warm, dry and supple bilateral lower extremities. Negative for open lesions or macerations bilateral. Thickened discolored nails noted digits 1-5 bilateral.   Vascular: Dorsalis Pedis and Posterior Tibial pulses palpable bilateral.  Capillary fill time is immediate to all digits.  Neurological: Epicritic and protective threshold intact bilateral.   Musculoskeletal: Tenderness to palpation to the plantar aspect of the left heel along the plantar fascia. All other joints range of motion within normal limits bilateral. Strength 5/5 in all groups bilateral.   Radiographic exam: Normal osseous mineralization. Joint spaces preserved. No fracture/dislocation/boney destruction. No other soft tissue abnormalities or radiopaque foreign bodies.   Assessment: 1. Plantar fasciitis left foot 2. Onychomycosis of toenails 1-5 bilateral  Plan of Care:  1. Patient evaluated. Xrays reviewed.   2. Injection of 0.5cc Celestone soluspan injected into the left plantar fascia.  3. Rx for Medrol Dose Pak placed 4. Rx for  Meloxicam ordered for patient. 5.  Continue OTC Aetrex insoles and good supportive sneakers 6. Instructed patient regarding therapies and modalities at home to alleviate symptoms.  7.  Prescription for Lamisil turned 50 mg #90 daily.  Patient denies any hepatic pathology or liver problems.  Patient otherwise healthy.   8.  Recommend shoes at [employer] running store  9.  Return to clinic in 6 weeks.    *Art Statistician at [place] M. Tasman Zapata, DPM [facility]  Dr. Edrick Kins, DPM    [address], [place] [address]                Office [phone]  Fax [phone]

## 2020-08-06 ENCOUNTER — Telehealth: Payer: Self-pay

## 2020-08-06 MED ORDER — ERYTHROMYCIN 5 MG/GM OP OINT: TOPICAL_OINTMENT | OPHTHALMIC | 0 refills | Status: DC

## 2020-08-06 NOTE — Telephone Encounter (Signed)
LMTCB-If patient calls back ok for [facility] nurse to give message

## 2020-08-06 NOTE — Telephone Encounter (Signed)
Patient called and advised [facility] She says she has the ointment from before and it's not working that she was asking for the oral antibiotic that she took for the sty. She says her eye is swollen almost shut that she can't see out of it.

## 2020-08-06 NOTE — Telephone Encounter (Signed)
Copied from [place] [phone] [phone]. Topic: Quick Communication - Rx Refill/Question >> Aug 06, 2020  9:17 AM Hinda Lenis D wrote: PT has some eye issues and requesting same medication she use last year / please advise

## 2020-08-06 NOTE — Telephone Encounter (Signed)
Sent in erythromycin [facility]int[facility]

## 2020-08-07 MED ORDER — AMOXICILLIN-POT CLAVULANATE 875-125 MG PO TABS: 1.0000 | ORAL_TABLET | Freq: Two times a day (BID) | ORAL | 0 refills | Status: DC

## 2020-08-07 NOTE — Telephone Encounter (Signed)
Changed to augmentin

## 2020-08-07 NOTE — Addendum Note (Signed)
Addended by: Mar Daring on: 08/07/2020 01:40 PM   Modules accepted: Orders

## 2020-08-10 ENCOUNTER — Telehealth: Payer: Self-pay | Admitting: *Deleted

## 2020-08-10 NOTE — Telephone Encounter (Signed)
Joyce Decker was the one that left her a message to call.

## 2020-08-10 NOTE — Telephone Encounter (Signed)
Patient returned call from [facility] from Friday.

## 2020-08-11 ENCOUNTER — Ambulatory Visit: Payer: BC Managed Care – PPO | Admitting: Podiatry

## 2020-08-11 NOTE — Telephone Encounter (Signed)
I was able to reach her this morning. Her mother is in the ED and someone was trying to call her so we were unable to make schedule changes. If she calls back, see if she can move her appointment to 9/21 at 1530 and remind her about lab appointment as well. If she cannot move appointment, please keep it as scheduled.

## 2020-08-12 ENCOUNTER — Telehealth: Payer: Self-pay | Admitting: *Deleted

## 2020-08-12 NOTE — Telephone Encounter (Signed)
Pt called on 08/12/20 to cx her 08/17/20 lab and her 08/19/20 GYN appt. Pt stated that she would contact the office at a later date to have appts R/S.Marland Kitchen

## 2020-08-15 ENCOUNTER — Other Ambulatory Visit: Payer: Self-pay | Admitting: Physician Assistant

## 2020-08-15 DIAGNOSIS — Z6828 Body mass index (BMI) 28.0-28.9, adult: Secondary | ICD-10-CM

## 2020-08-15 NOTE — Telephone Encounter (Signed)
Requested medication (s) are due for refill today: yes  Requested medication (s) are on the active medication list: yes  Last refill:  08/14/19  Future visit scheduled: no   Notes to clinic:  med not assigned to a protocol   Requested Prescriptions  Pending Prescriptions Disp Refills   CONTRAVE 8-90 MG TB12 [Pharmacy Med Name: CONTRAVE ER 8-90 MG TABLET] 120 tablet 11    Sig: START 1 TABLET EVERY MORNING FOR 7 DAYS, THEN 1 TABLET TWICE DAILY FOR 7 DAYS, THEN 2 TABLETS EVERY MORNING AND ONE IN THE EVENING      Off-Protocol Failed - 08/15/2020  8:44 AM      Failed - Medication not assigned to a protocol, review manually.      Failed - Valid encounter within last 12 months    Recent Outpatient Visits           1 year ago Preseptal cellulitis of right lower eyelid   [facility] [place], [place], [place]   1 year ago Encounter for weight loss counseling   [place], [place], [place]   1 year ago Painful urination   [place], [place], [place]   1 year ago BMI 28.0-28.9,adult   [place], [place], [place]   2 years ago BMI 26.0-26.9,adult   [facility], [place], [place]

## 2020-08-17 ENCOUNTER — Inpatient Hospital Stay: Payer: BC Managed Care – PPO

## 2020-08-19 ENCOUNTER — Inpatient Hospital Stay: Payer: BC Managed Care – PPO

## 2020-08-29 ENCOUNTER — Other Ambulatory Visit: Payer: Self-pay | Admitting: Podiatry

## 2020-08-30 NOTE — Telephone Encounter (Signed)
Please advise 

## 2020-09-07 NOTE — Telephone Encounter (Signed)
I gave her 90 days of pills only 2 months ago? Not sure why she is requesting a refill only 60 days later? - Dr. Amalia Hailey

## 2020-09-08 ENCOUNTER — Other Ambulatory Visit: Payer: Self-pay

## 2020-09-08 ENCOUNTER — Ambulatory Visit (INDEPENDENT_AMBULATORY_CARE_PROVIDER_SITE_OTHER): Payer: BC Managed Care – PPO | Admitting: Podiatry

## 2020-09-08 DIAGNOSIS — M722 Plantar fascial fibromatosis: Secondary | ICD-10-CM

## 2020-09-08 DIAGNOSIS — G5792 Unspecified mononeuropathy of left lower limb: Secondary | ICD-10-CM

## 2020-09-08 MED ORDER — GABAPENTIN 100 MG PO CAPS: 100.0000 mg | ORAL_CAPSULE | Freq: Three times a day (TID) | ORAL | 3 refills | Status: DC

## 2020-09-08 NOTE — Progress Notes (Signed)
   Subjective: 64 y.o. female presenting for follow-up evaluation of left heel pain that's been ongoing for several months.  Patient states that the anti-inflammatory steroid injection helped significantly.   Today there is a new complaint and she does relate a history of stepping on a nail approximately 5 years ago.  Patient states that since that time she has had what she describes as nerve pain to the heel area.  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  She would like to discuss different treatment options for the nerve related pain to the heel versus the plantar fascial pain.     Past Medical History:  Diagnosis Date  . Asthma   . BRCA1 gene mutation positive   . Cancer [facility]) 2006   ovarian cancer 2006  . Gestational diabetes   . Hyperthyroidism   . Hypothyroidism   . Kidney stones   . Personal history of chemotherapy 2006   ovarian ca  . Sleep apnea    CPAP     Objective: Physical Exam General: The patient is alert and oriented x3 in no acute distress.  Dermatology: Skin is warm, dry and supple bilateral lower extremities. Negative for open lesions or macerations bilateral. Thickened discolored nails noted digits 1-5 bilateral.   Vascular: Dorsalis Pedis and Posterior Tibial pulses palpable bilateral.  Capillary fill time is immediate to all digits.  Neurological: Epicritic and protective threshold intact bilateral.   Musculoskeletal: Tenderness to palpation to the plantar aspect of the left heel along the plantar fascia. All other joints range of motion within normal limits bilateral. Strength 5/5 in all groups bilateral.  Angie wake up  Assessment: 1. Plantar fasciitis left foot 2. Onychomycosis of toenails 1-5 bilateral 3.  Neuritis left heel  Plan of Care:  1. Patient evaluated.  2. Injection of 0.5cc Celestone soluspan injected into the left plantar fascia.  3.  Continue meloxicam daily as  needed 4.  In regards to the neuritis of the heel, prescription for gabapentin 100 mg 3 times daily prescribed.  We did discuss different treatment options including topical versus oral treatment medications.  She would like to try the gabapentin oral. 5.  Continue OTC Aetrex insoles and good supportive sneakers 6. Instructed patient regarding therapies and modalities at home to alleviate symptoms.  7.  Continue Lamisil 250 mg daily 8.  Recommend shoes at [employer] running store  9.  Return to clinic as needed  *Engineer, petroleum at [facility]  Edrick Kins, [place] [facility]  Dr. Edrick Kins, DPM    [address], [place] [address]                Office [phone]  Fax [phone]

## 2020-09-22 ENCOUNTER — Other Ambulatory Visit: Payer: Self-pay | Admitting: Podiatry

## 2020-09-22 NOTE — Telephone Encounter (Signed)
Please advise 

## 2020-10-06 ENCOUNTER — Telehealth: Payer: Self-pay

## 2020-10-06 ENCOUNTER — Other Ambulatory Visit: Payer: Self-pay

## 2020-10-06 DIAGNOSIS — Z1501 Genetic susceptibility to malignant neoplasm of breast: Secondary | ICD-10-CM

## 2020-10-06 DIAGNOSIS — Z1509 Genetic susceptibility to other malignant neoplasm: Secondary | ICD-10-CM

## 2020-10-06 NOTE — Telephone Encounter (Addendum)
Please schedule mammogram/ breast US- to be done in March 2022. Move MD appt to a few days after mammo. Please call pt with appts.

## 2020-10-06 NOTE — Telephone Encounter (Signed)
-----   Message from Earlie Server, MD sent at 10/06/2020  3:59 PM EST ----- Yes. Ok to do it in March and see me after ----- Message ----- From: Evelina Dun, RN Sent: 10/06/2020   1:44 PM EST To: Evelina Dun, RN, Vanice Sarah, CMA, #   ----- Message ----- From: Evelina Dun, RN Sent: 10/06/2020   1:34 PM EST To: Evelina Dun, RN, [place], #  Patient had screening mammogram on 02/13/20 and MRI breast on 03/13/20 at [facility] center.   Patient had previous imaging done at [facility] every 6 months alteranting between MRI and Mammo.   Attempt was made on 4/15 to schedule mammo in 6 months but per [facility] radiology pt already had mammo this year and cannot have another one until 01/2021. MRI was done this year already as well, (earlier than usual 6 month interval). So per radiology when she has the mammo in March 2022, then she will go back to track with alternating mammo and MRI every 6 months .    MD follow up was scheduled back in April and she is to return back to see you on 03/12/21. Do you want to get mammogram scheduled for March?

## 2020-10-07 ENCOUNTER — Other Ambulatory Visit: Payer: Self-pay

## 2020-10-07 DIAGNOSIS — Z1509 Genetic susceptibility to other malignant neoplasm: Secondary | ICD-10-CM

## 2020-10-07 DIAGNOSIS — Z1501 Genetic susceptibility to malignant neoplasm of breast: Secondary | ICD-10-CM

## 2020-10-07 NOTE — Telephone Encounter (Signed)
Please notify pt of appts

## 2020-10-07 NOTE — Telephone Encounter (Signed)
Mammogram order was changed to [facility] [phone]) has been scheduled as requested. Per  Roselyn Reef from [place] stated that [place] are not done with a Screening Mammogram they are Only done with and DIAGNOSTIC.

## 2020-10-07 NOTE — Telephone Encounter (Signed)
Done... A detailed message was left on pts VM making her aware of her upcoming  appts.

## 2020-10-23 ENCOUNTER — Other Ambulatory Visit: Payer: Self-pay | Admitting: Podiatry

## 2020-11-09 ENCOUNTER — Telehealth: Payer: Self-pay

## 2020-11-09 NOTE — Telephone Encounter (Signed)
Can we see if we can find her sleep study. I found a usage report from 2017, but couldn't find a sleep study. If her sleep study is over 62 years old she will have to repeat before we can get a new machine. That is insurance guidelines

## 2020-11-09 NOTE — Telephone Encounter (Signed)
Can we see who she got her previous cpap through so we know which company to fax the order to?  Thanks

## 2020-11-09 NOTE — Telephone Encounter (Signed)
Copied from [place] [id number]. Topic: General - Other >> Nov 09, 2020  3:42 PM Erick Blinks wrote: Reason for CRM: Pt has a CPAP machine that has a recall that could take up to a year to replace. Pt needs a replacement for a CPAP machine.    Best contact: [phone] VM

## 2020-11-09 NOTE — Telephone Encounter (Signed)
Per patient she is not sure which company because when she spoke to the representative they told her a lot of their equipment were being recalled. She has the  [employer] dream cpap that has been recalled-she is not sure where to start.

## 2020-11-11 NOTE — Telephone Encounter (Signed)
Found sleep study report in HDA from 2015. Printed and placed on your desk for review.

## 2020-11-11 NOTE — Telephone Encounter (Signed)
Thanks Rachelle! You are a rock star!

## 2020-11-16 ENCOUNTER — Telehealth: Payer: Self-pay | Admitting: Physician Assistant

## 2020-11-16 NOTE — Telephone Encounter (Signed)
Patient is calling regarding her CPAP machine that has been recalled. Patient is not wanting to buy a new machine. Patient is interest in a rental of a CPAP machine- The patient is interested in absorbing the cost of the rental until the company can replaced the CPAP machine.   Patient is requesting if Tawanna Sat nurse can reach out to the company for a rental of a CPAP machine instead a purchase.  CB- [phone]

## 2020-11-17 NOTE — Telephone Encounter (Signed)
Josie can we call [facility] ( I think is where we sent) to see if they rent CPAPs please?

## 2020-11-17 NOTE — Telephone Encounter (Signed)
Patient is calling to speak to Joyce Decker to ask Joyce Decker to write a letter to get the process started to write a letter to either get a CPAP rental or purchasing another machine.  Do not put in the letter that the machine has recalled.And that it is not working properly also need to state letter that dx has been within 5 years. Patient is unsure the date that her sleep apnea was dx.  Cb- [phone]

## 2020-11-17 NOTE — Telephone Encounter (Signed)
Called [facility] and they do rent cpap machines. They needed mor information on how the patient wanted to rent the cpap machine. Called patient and gave her Huey Romans number

## 2020-11-18 NOTE — Telephone Encounter (Signed)
Letter faxed to [facility].

## 2020-11-18 NOTE — Telephone Encounter (Signed)
She spoke to the manufacturers and they told her that they are at a short supply of machines. She is interested in having surgery to resolve her sleep apnea, interested in discussion with PCP

## 2020-11-18 NOTE — Telephone Encounter (Signed)
Letter printed and will be faxed to [facility]

## 2020-11-19 NOTE — Telephone Encounter (Signed)
Patient scheduled.

## 2020-11-19 NOTE — Telephone Encounter (Signed)
She will need an appt for the referral. Virtual is ok.  There is an Chief Financial Officer in [place] that does the inspire procedure.

## 2020-11-24 NOTE — Progress Notes (Signed)
MyChart Video Visit    Virtual Visit via Video Note   This visit type was conducted due to national recommendations for restrictions regarding the COVID-19 Pandemic (e.g. social distancing) in an effort to limit this patient's exposure and mitigate transmission in our community. This patient is at least at moderate risk for complications without adequate follow up. This format is felt to be most appropriate for this patient at this time. Physical exam was limited by quality of the video and audio technology used for the visit.   Patient location: Home Provider location: [facility]  I discussed the limitations of evaluation and management by telemedicine and the availability of in person appointments. The patient expressed understanding and agreed to proceed.  Patient: Joyce Decker   DOB: 03/22/1956   64 y.o. Female  MRN: [id number] Visit Date: 11/25/2020  Today's healthcare provider: Margaretann Loveless, PA-C   Chief Complaint  Patient presents with  . Referral   Subjective    HPI  Patient requesting referral for surgery to help with sleep apnea. Patient reports her CPAP is non functioning and she has been told they do not know exactly when she can get a replacement. She is looking at all alternatives in case they cannot get her a replacement CPAP. She has been having increasing migraines since not being able to use her CPAP.   Patient Active Problem List   Diagnosis Date Noted  . 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 History of migraine headaches 04/09/2015  . HLD (hyperlipidemia) 04/09/2015  . Chronic interstitial cystitis 04/09/2015  . H/O ovarian cancer 06/08/2012  . H/O renal calculi 06/08/2012  . Urethral cyst 06/08/2012  . 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 Awareness of heartbeats 02/19/2010  . Apnea, sleep 02/19/2010  . Airway hyperreactivity 05/06/2008  . Family history of diabetes  mellitus 05/06/2008  . Fam hx-ischem heart disease 05/06/2008   Social History   Tobacco Use  . Smoking status: Former Games developer  . Smokeless tobacco: Never Used  . Tobacco comment: 35 years ago  Vaping Use  . Vaping Use: Never used  Substance Use Topics  . Alcohol use: No  . Drug use: No   Allergies  Allergen Reactions  . Other Other (See Comments)    Wheat barley and Grain unknown allergy, possible cause headache.  . Tape Other (See Comments)    Blisters  . Gadobenate Nausea Only    PT became nauseous with contrast, but did not vomit.    . Iodinated Diagnostic Agents Nausea Only      Medications: Outpatient Medications Prior to Visit  Medication Sig  . amoxicillin-clavulanate (AUGMENTIN) 875-125 MG tablet Take 1 tablet by mouth 2 (two) times daily.  Marland Kitchen BIOTIN PO Take 1 tablet by mouth daily.   . calcium-vitamin D (OSCAL WITH D) 500-200 MG-UNIT per tablet Take 1 tablet by mouth.  . Cinnamon 500 MG capsule Take 500 mg by mouth daily.  Marland Kitchen CRANBERRY PO Take 1 capsule by mouth daily.  Marland Kitchen gabapentin (NEURONTIN) 100 MG capsule Take 1 capsule (100 mg total) by mouth 3 (three) times daily.  . Melatonin 3 MG TABS Take 1 tablet by mouth at bedtime.  . meloxicam (MOBIC) 15 MG tablet TAKE 1 TABLET BY MOUTH EVERY DAY  . Multiple Vitamin (MULTIVITAMIN) capsule Take 1 capsule by mouth daily.  . [DISCONTINUED] CONTRAVE 8-90 MG TB12 START 1 TABLET EVERY MORNING FOR 7 DAYS, THEN 1 TABLET  TWICE DAILY FOR 7 DAYS, THEN 2 TABLETS EVERY MORNING AND ONE IN THE EVENING (Patient not taking: Reported on 11/25/2020)  . [DISCONTINUED] erythromycin ophthalmic ointment Use in affected eye TID x 5-7 days (Patient not taking: Reported on 11/25/2020)  . [DISCONTINUED] methylPREDNISolone (MEDROL DOSEPAK) 4 MG TBPK tablet 6 day dose pack - take as directed (Patient not taking: Reported on 11/25/2020)  . [DISCONTINUED] ondansetron (ZOFRAN) 4 MG tablet Take 1 tablet (4 mg total) by mouth every 8 (eight) hours as  needed. (Patient not taking: Reported on 11/25/2020)  . [DISCONTINUED] terbinafine (LAMISIL) 250 MG tablet Take 1 tablet (250 mg total) by mouth daily. (Patient not taking: Reported on 11/25/2020)   No facility-administered medications prior to visit.    Review of Systems  Constitutional: Negative.   Respiratory: Negative.   Cardiovascular: Negative.   Neurological: Positive for headaches.    Last CBC Lab Results  Component Value Date   WBC 9.3 01/28/2020   HGB 15.4 (H) 01/28/2020   HCT 47.1 (H) 01/28/2020   MCV 89.4 01/28/2020   MCH 29.2 01/28/2020   RDW 12.9 01/28/2020   PLT 252 01/28/2020      Objective    Wt 165 lb (74.8 kg)   BMI 28.32 kg/m  BP Readings from Last 3 Encounters:  03/11/20 124/89  01/28/20 137/87  08/21/19 122/87   Wt Readings from Last 3 Encounters:  11/25/20 165 lb (74.8 kg)  03/11/20 166 lb 1.6 oz (75.3 kg)  01/28/20 166 lb 6.4 oz (75.5 kg)      Physical Exam Vitals reviewed.  Constitutional:      Appearance: Normal appearance. She is well-developed and well-nourished.  HENT:     Head: Normocephalic and atraumatic.  Eyes:     Extraocular Movements: EOM normal.  Pulmonary:     Effort: Pulmonary effort is normal. No respiratory distress.  Musculoskeletal:     Cervical back: Normal range of motion and neck supple.  Neurological:     Mental Status: She is alert.  Psychiatric:        Mood and Affect: Mood and affect and mood normal.        Behavior: Behavior normal.        Thought Content: Thought content normal.        Judgment: Judgment normal.        Assessment & Plan     1. Chronic migraine without aura without status migrainosus, not intractable Tried and failed excedrin migraine, ibuprofen, tylenol, and imitrex. Will try Nurtec as below. Call if not improving.  - Rimegepant Sulfate (NURTEC) 75 MG TBDP; Take 75 mg by mouth daily as needed.  Dispense: 8 tablet; Refill: 5  2. OSA on CPAP Patient's CPAP is not functioning.  Unknown when she will get replacement. Referral placed to [facility]NT for considerations of a permanent procedure to not need a CPAP. Have also been reaching out to other companies to try to get a replacement CPAP. Sleep study is in health data and sent to be scanned in Epic was last done in 2016.    No follow-ups on file.     I discussed the assessment and treatment plan with the patient. The patient was provided an opportunity to ask questions and all were answered. The patient agreed with the plan and demonstrated an understanding of the instructions.   The patient was advised to call back or seek an in-person evaluation if the symptoms worsen or if the condition fails to improve as anticipated.  I provided 23 minutes of face-to-face time during this encounter via MyChart Video enabled encounter.  Reynolds Bowl, PA-C, have reviewed all documentation for this visit. The documentation on 11/30/20 for the exam, diagnosis, procedures, and orders are all accurate and complete.  [place] [facility] [phone] (phone) [phone] (fax)  [place]

## 2020-11-25 ENCOUNTER — Telehealth (INDEPENDENT_AMBULATORY_CARE_PROVIDER_SITE_OTHER): Payer: BC Managed Care – PPO | Admitting: Physician Assistant

## 2020-11-25 ENCOUNTER — Encounter: Payer: Self-pay | Admitting: Physician Assistant

## 2020-11-25 ENCOUNTER — Telehealth: Payer: Self-pay | Admitting: Physician Assistant

## 2020-11-25 VITALS — Wt 165.0 lb

## 2020-11-25 DIAGNOSIS — G43709 Chronic migraine without aura, not intractable, without status migrainosus: Secondary | ICD-10-CM

## 2020-11-25 DIAGNOSIS — G4733 Obstructive sleep apnea (adult) (pediatric): Secondary | ICD-10-CM

## 2020-11-25 DIAGNOSIS — Z9989 Dependence on other enabling machines and devices: Secondary | ICD-10-CM

## 2020-11-25 MED ORDER — NURTEC 75 MG PO TBDP
75.0000 mg | ORAL_TABLET | Freq: Every day | ORAL | 5 refills | Status: DC | PRN
Start: 2020-11-25 — End: 2021-06-11

## 2020-11-25 NOTE — Telephone Encounter (Signed)
I will refer and we can send her old sleep study from 2016.   They may accept that.

## 2020-11-25 NOTE — Telephone Encounter (Signed)
Patient is calling to requesting a referral for Dr Katherina Mires. Dr. Everardo All is an ENT at [facility] Patient would need a sleep study prior to seeing Dr. Everardo All.  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 Patient is looking for sleep study, and appt with Dr. Kerby Less to be on 4/15-21/22 Cb- [phone]

## 2020-11-26 NOTE — Telephone Encounter (Signed)
Patient advised.

## 2020-11-30 ENCOUNTER — Encounter: Payer: Self-pay | Admitting: Physician Assistant

## 2020-12-02 ENCOUNTER — Telehealth: Payer: Self-pay | Admitting: Physician Assistant

## 2020-12-17 ENCOUNTER — Telehealth: Payer: Self-pay

## 2020-12-17 NOTE — Telephone Encounter (Signed)
Copied from [place] [phone]. Topic: Quick Communication - See Telephone Encounter >> Dec 17, 2020 12:15 PM Loma [place] wrote: CRM for notification. See Telephone encounter for: 12/17/20. Pt sending request for Josie to cb... after 4:00 she is in a class. Cb is [phone]  re sleep apnea, important

## 2020-12-23 NOTE — Telephone Encounter (Signed)
Spoke with patient and reports that she still has not heard from [place] regarding the cpap machine and if we can see the update on this or fax a prescription to another companies that can help her. She really need he cpap and feels this is affecting her sleep. Reports that a few week ago she woke up with jaw pain because of her teeth grinding. Can someone help with this please. Since I am not in the office.

## 2020-12-25 ENCOUNTER — Telehealth: Payer: Self-pay

## 2020-12-25 NOTE — Telephone Encounter (Signed)
Can we call [facility] to check on the status of this. If they do not have any we can try [employer]

## 2020-12-25 NOTE — Telephone Encounter (Signed)
error 

## 2020-12-28 NOTE — Telephone Encounter (Signed)
PT requesting a callback to go over different option

## 2021-01-03 ENCOUNTER — Other Ambulatory Visit: Payer: Self-pay | Admitting: Podiatry

## 2021-01-04 NOTE — Telephone Encounter (Signed)
Please advise 

## 2021-01-04 NOTE — Telephone Encounter (Signed)
We could call [facility] in [place] to see if they have any.  We could also try [facility] too.  We can call to see if they may be able to help sooner. If they can we can fax all the information to them.

## 2021-01-04 NOTE — Telephone Encounter (Signed)
The order for her replacement Cpap is though [facility]. I called [facility] and they still don't have a time on when they will be able to get a Cpap to her.  The receptionist states the will give Joyce Decker a call and give her an update.  Joyce Decker mentioned that you may have some companies in mind that are not local and was wondering if we could try that route.    This is the pt's recall information from [place] (the Cpap that was recalled.)  [employer]:  [phone] Registration number: [phone]   Pt wanted [place] to have the information on hand in case we needed it.    Thanks,   -Mickel Baas

## 2021-01-05 NOTE — Telephone Encounter (Signed)
I spoke with a representative from [place] they said it would be several months before they would be able to get Joyce Decker a Cpap.  I tried calling [facility] and there was no answer.  I'll try again later.   Thanks,   -Mickel Baas

## 2021-01-08 NOTE — Telephone Encounter (Signed)
I spoke with Ms. Luberto today.  She did hear from [employer] earlier in the week.  They left her a voicemail stating they where not sure where she was on the list but would call her back.  She states they have not called back yet.  I advised her I would give them a call on Monday to see if they have any updates.   Thanks,   -Mickel Baas

## 2021-02-16 ENCOUNTER — Ambulatory Visit
Admission: RE | Admit: 2021-02-16 | Discharge: 2021-02-16 | Disposition: A | Discharge status: Home / Self Care | Payer: BC Managed Care – PPO | Source: Ambulatory Visit | Attending: Oncology | Admitting: Oncology

## 2021-02-16 ENCOUNTER — Other Ambulatory Visit: Payer: Self-pay

## 2021-02-16 DIAGNOSIS — Z1509 Genetic susceptibility to other malignant neoplasm: Secondary | ICD-10-CM

## 2021-02-16 DIAGNOSIS — Z1231 Encounter for screening mammogram for malignant neoplasm of breast: Secondary | ICD-10-CM | POA: Insufficient documentation

## 2021-02-16 DIAGNOSIS — Z1501 Genetic susceptibility to malignant neoplasm of breast: Secondary | ICD-10-CM | POA: Diagnosis not present

## 2021-02-19 ENCOUNTER — Ambulatory Visit: Payer: BC Managed Care – PPO | Admitting: Oncology

## 2021-02-20 ENCOUNTER — Encounter: Payer: Self-pay | Admitting: Physician Assistant

## 2021-02-24 NOTE — Telephone Encounter (Signed)
Can we call and check on her CPAP order, please? There should be some old phone notes for where it was sent.

## 2021-03-09 ENCOUNTER — Encounter: Payer: BC Managed Care – PPO | Admitting: Podiatry

## 2021-03-12 ENCOUNTER — Ambulatory Visit: Payer: BC Managed Care – PPO | Admitting: Oncology

## 2021-03-17 ENCOUNTER — Encounter: Payer: Self-pay | Admitting: Oncology

## 2021-03-17 ENCOUNTER — Inpatient Hospital Stay: Payer: BC Managed Care – PPO | Attending: Oncology | Admitting: Oncology

## 2021-03-17 ENCOUNTER — Inpatient Hospital Stay: Payer: BC Managed Care – PPO

## 2021-03-17 VITALS — BP 115/74 | HR 79 | Temp 98.1°F | Resp 16 | Wt 169.0 lb

## 2021-03-17 DIAGNOSIS — Z87891 Personal history of nicotine dependence: Secondary | ICD-10-CM | POA: Diagnosis not present

## 2021-03-17 DIAGNOSIS — Z8543 Personal history of malignant neoplasm of ovary: Secondary | ICD-10-CM

## 2021-03-17 DIAGNOSIS — Z08 Encounter for follow-up examination after completed treatment for malignant neoplasm: Secondary | ICD-10-CM | POA: Diagnosis not present

## 2021-03-17 DIAGNOSIS — R1013 Epigastric pain: Secondary | ICD-10-CM

## 2021-03-17 DIAGNOSIS — Z1501 Genetic susceptibility to malignant neoplasm of breast: Secondary | ICD-10-CM | POA: Insufficient documentation

## 2021-03-17 DIAGNOSIS — Z1509 Genetic susceptibility to other malignant neoplasm: Secondary | ICD-10-CM

## 2021-03-17 DIAGNOSIS — Z1502 Genetic susceptibility to malignant neoplasm of ovary: Secondary | ICD-10-CM | POA: Insufficient documentation

## 2021-03-17 DIAGNOSIS — Z8507 Personal history of malignant neoplasm of pancreas: Secondary | ICD-10-CM

## 2021-03-17 LAB — COMPREHENSIVE METABOLIC PANEL
ALT: 26 U/L (ref 0–44)
AST: 23 U/L (ref 15–41)
Albumin: 4.2 g/dL (ref 3.5–5.0)
Alkaline Phosphatase: 57 U/L (ref 38–126)
Anion gap: 10 (ref 5–15)
BUN: 21 mg/dL (ref 8–23)
CO2: 27 mmol/L (ref 22–32)
Calcium: 9.5 mg/dL (ref 8.9–10.3)
Chloride: 102 mmol/L (ref 98–111)
Creatinine, Ser: 0.79 mg/dL (ref 0.44–1.00)
GFR, Estimated: >60 mL/min (ref >60)
Glucose, Bld: 96 mg/dL (ref 70–99)
Potassium: 4 mmol/L (ref 3.5–5.1)
Sodium: 139 mmol/L (ref 135–145)
Total Bilirubin: 0.5 mg/dL (ref 0.3–1.2)
Total Protein: 7.3 g/dL (ref 6.5–8.1)

## 2021-03-17 LAB — CBC WITH DIFFERENTIAL/PLATELET
Abs Immature Granulocytes: 0.02 10*3/uL (ref 0.00–0.07)
Basophils Absolute: 0.1 10*3/uL (ref 0.0–0.1)
Basophils Relative: 1 %
Eosinophils Absolute: 0.2 10*3/uL (ref 0.0–0.5)
Eosinophils Relative: 2 %
HCT: 44.3 % (ref 36.0–46.0)
Hemoglobin: 15 g/dL (ref 12.0–15.0)
Immature Granulocytes: 0 %
Lymphocytes Relative: 29 %
Lymphs Abs: 2 10*3/uL (ref 0.7–4.0)
MCH: 30.2 pg (ref 26.0–34.0)
MCHC: 33.9 g/dL (ref 30.0–36.0)
MCV: 89.1 fL (ref 80.0–100.0)
Monocytes Absolute: 0.8 10*3/uL (ref 0.1–1.0)
Monocytes Relative: 12 %
Neutro Abs: 4 10*3/uL (ref 1.7–7.7)
Neutrophils Relative %: 56 %
Platelets: 207 10*3/uL (ref 150–400)
RBC: 4.97 MIL/uL (ref 3.87–5.11)
RDW: 13.2 % (ref 11.5–15.5)
WBC: 7.1 10*3/uL (ref 4.0–10.5)
nRBC: 0 % (ref 0.0–0.2)

## 2021-03-17 NOTE — Progress Notes (Signed)
Pt in for yearly follow up.

## 2021-03-17 NOTE — Progress Notes (Signed)
Hematology/Oncology follow up  note [facility] Telephone:([phone]) [phone] [phone]   Patient Care Team: Rubye Beach as PCP - General (Family Medicine) Clent Jacks, RN as Oncology Nurse Navigator  REFERRING PROVIDER: Mar Daring, P*  CHIEF COMPLAINTS/REASON FOR VISIT:  Follow up for high risk for breast cancer, BRCA-1 mutation.   HISTORY OF PRESENTING ILLNESS:   Joyce Decker is a  65 y.o.  female with PMH listed below was seen in consultation at the request of  Mar Daring, P*  for evaluation of high risk for breast cancer, BRCA mutation.   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  patient underwent surgery, postoperatively received adjuvant therapy with cisplatin and paclitaxel, intraperitoneal and IV therapy.  Patient has been in complete remission since then. Patient lost follow-up for a few years and recently reestablished care with Dr. Theora Gianotti.  Per patient, patient was tested positive for BRCA1 mutation and she was sent to establish care for high risk breast cancer clinic. Last mammogram was done in August 2017.  Patient has no complaints today.  Patient denies any breast concerns.  Patient reports that one of her son was also tested positive for BRCA1 mutation.  Her younger sons have not been tested yet.  BRCA1 mutation report was scanned to EMR.  INTERVAL HISTORY Joyce Decker is a 65 y.o. female who has above history reviewed by me today presents for follow up visit for management of history of ovarian cancer, BRCA1 mutation. Problems and complaints are listed below: Recently patient has had bilateral diagnostic mammogram done.  Negative for mammographic evidence of malignancy. She has no new complaints.  Review of Systems  Constitutional: Negative  for appetite change, chills, fatigue and fever.  HENT:   Negative for hearing loss and voice change.   Eyes: Negative for eye problems.  Respiratory: Negative for chest tightness and cough.   Cardiovascular: Negative for chest pain.  Gastrointestinal: Negative for abdominal distention, abdominal pain and blood in stool.  Endocrine: Negative for hot flashes.  Genitourinary: Negative for difficulty urinating and frequency.   Musculoskeletal: Negative for arthralgias.  Skin: Negative for itching and rash.  Neurological: Negative for extremity weakness.  Hematological: Negative for adenopathy.  Psychiatric/Behavioral: Negative for confusion.    MEDICAL HISTORY:  Past Medical History:  Diagnosis Date  . Asthma   . BRCA1 gene mutation positive   . Cancer [facility]) 2006   ovarian cancer 2006  . Gestational diabetes   . Hyperthyroidism   . Hypothyroidism   . Kidney stones   . Personal history of chemotherapy 2006   ovarian ca  . Sleep apnea    CPAP    SURGICAL HISTORY: Past Surgical History:  Procedure Laterality Date  . ABDOMINAL HYSTERECTOMY Bilateral    AND BSO  . COLONOSCOPY WITH PROPOFOL N/A 07/03/2015   Procedure: COLONOSCOPY WITH PROPOFOL;  Surgeon: Josefine Class, MD;  Location: [facility] ENDOSCOPY;  Service: Endoscopy;  Laterality: N/A;  . Dental Implant    . OOPHORECTOMY    . TONSILLECTOMY AND ADENOIDECTOMY      SOCIAL HISTORY: Social History   Socioeconomic History  . Marital status: Married    Spouse name: Not on file  . Number of children: Not on file  . Years of education: Not on file  . Highest education level: Not on file  Occupational History  . Not  on file  Tobacco Use  . Smoking status: Former Research scientist (life sciences)  . Smokeless tobacco: Never Used  . Tobacco comment: 35 years ago  Vaping Use  . Vaping Use: Never used  Substance and Sexual Activity  . Alcohol use: No  . Drug use: No  . Sexual activity: Yes  Other Topics Concern  . Not on file  Social History  Narrative  . Not on file   Social Determinants of Health   Financial Resource Strain: Not on file  Food Insecurity: Not on file  Transportation Needs: Not on file  Physical Activity: Not on file  Stress: Not on file  Social Connections: Not on file  Intimate Partner Violence: Not on file    FAMILY HISTORY: Family History  Problem Relation Age of Onset  . Diabetes Mother   . Hypertension Mother   . Diabetes Maternal Aunt   . Heart disease Maternal Aunt   . Hypertension Maternal Aunt   . Diabetes Maternal Uncle   . Heart disease Maternal Uncle   . Hypertension Maternal Uncle   . Colon cancer Maternal Uncle   . Brain cancer Maternal Grandmother   . Diabetes Cousin   . Breast cancer Cousin   . Melanoma Other   . Stomach cancer Other   . Prostate cancer Maternal Uncle   . Prostate cancer Maternal Uncle   . Breast cancer Cousin   . Breast cancer Cousin   . Breast cancer Cousin   . BRCA 1/2 Son   . Breast cancer Cousin   . Breast cancer Cousin     ALLERGIES:  is allergic to other, tape, gadobenate, and iodinated diagnostic agents.  MEDICATIONS:  Current Outpatient Medications  Medication Sig Dispense Refill  . BIOTIN PO Take 1 tablet by mouth daily.     . calcium-vitamin D (OSCAL WITH D) 500-200 MG-UNIT per tablet Take 1 tablet by mouth.    . Cinnamon 500 MG capsule Take 500 mg by mouth daily.    Marland Kitchen CRANBERRY PO Take 1 capsule by mouth daily.    Marland Kitchen gabapentin (NEURONTIN) 100 MG capsule Take 1 capsule (100 mg total) by mouth 3 (three) times daily. 90 capsule 3  . Melatonin 3 MG TABS Take 1 tablet by mouth at bedtime.    . meloxicam (MOBIC) 15 MG tablet TAKE 1 TABLET BY MOUTH EVERY DAY 30 tablet 1  . Multiple Vitamin (MULTIVITAMIN) capsule Take 1 capsule by mouth daily.    . Rimegepant Sulfate (NURTEC) 75 MG TBDP Take 75 mg by mouth daily as needed. (Patient not taking: Reported on 03/17/2021) 8 tablet 5   No current facility-administered medications for this visit.      PHYSICAL EXAMINATION: ECOG PERFORMANCE STATUS: 0 - Asymptomatic Vitals:   03/17/21 1433  BP: 115/74  Pulse: 79  Resp: 16  Temp: 98.1 F (36.7 C)  SpO2: 98%   Filed Weights   03/17/21 1433  Weight: 169 lb (76.7 kg)    Physical Exam Constitutional:      General: She is not in acute distress. HENT:     Head: Normocephalic and atraumatic.  Eyes:     General: No scleral icterus. Cardiovascular:     Rate and Rhythm: Normal rate and regular rhythm.     Heart sounds: Normal heart sounds.  Pulmonary:     Effort: Pulmonary effort is normal. No respiratory distress.     Breath sounds: No wheezing.  Abdominal:     General: Bowel sounds are normal. There is no distension.  Palpations: Abdomen is soft.  Musculoskeletal:        General: No deformity. Normal range of motion.     Cervical back: Normal range of motion and neck supple.  Skin:    General: Skin is warm and dry.     Findings: No erythema or rash.  Neurological:     Mental Status: She is alert and oriented to person, place, and time. Mental status is at baseline.     Cranial Nerves: No cranial nerve deficit.     Coordination: Coordination normal.  Psychiatric:        Mood and Affect: Mood normal.     LABORATORY DATA:  I have reviewed the data as listed Lab Results  Component Value Date   WBC 7.1 03/17/2021   HGB 15.0 03/17/2021   HCT 44.3 03/17/2021   MCV 89.1 03/17/2021   PLT 207 03/17/2021   Recent Labs    03/17/21 1532  NA 139  K 4.0  CL 102  CO2 27  GLUCOSE 96  BUN 21  CREATININE 0.79  CALCIUM 9.5  GFRNONAA >60  PROT 7.3  ALBUMIN 4.2  AST 23  ALT 26  ALKPHOS 57  BILITOT 0.5   Iron/TIBC/Ferritin/ %Sat No results found for: IRON, TIBC, FERRITIN, IRONPCTSAT    RADIOGRAPHIC STUDIES: I have personally reviewed the radiological images as listed and agreed with the findings in the report.  MM 3D SCREEN BREAST BILATERAL  Result Date: 02/17/2021 CLINICAL DATA:  Screening. EXAM:  DIGITAL SCREENING BILATERAL MAMMOGRAM WITH TOMOSYNTHESIS AND CAD TECHNIQUE: Bilateral screening digital craniocaudal and mediolateral oblique mammograms were obtained. Bilateral screening digital breast tomosynthesis was performed. The images were evaluated with computer-aided detection. COMPARISON:  Previous exam(s). ACR Breast Density Category b: There are scattered areas of fibroglandular density. FINDINGS: There are no findings suspicious for malignancy. The images were evaluated with computer-aided detection. IMPRESSION: No mammographic evidence of malignancy. A result letter of this screening mammogram will be mailed directly to the patient. RECOMMENDATION: Screening mammogram in one year. (Code:SM-B-01Y) BI-RADS CATEGORY  1: Negative. Electronically Signed   By: Audie Pinto M.D.   On: 02/17/2021 16:21      ASSESSMENT & PLAN:  1. BRCA1 gene mutation positive   2. H/O ovarian cancer   3. Encounter for follow-up surveillance of pancreatic cancer   4. Epigastric pain    BRCA-1 mutation positive, lab report was scanned to EMR. Recommend bilateral diagnostic mammogram annually alternate with breast MRI bilaterally annually. Mammogram was reviewed and discussed with patient Obtain breast MRI bilaterally in 6 months  Recommend annual MRI abdomen pancreatic protocol for pancreatic cancer screening.  History of ovarian cancer, check CBC, CMP, CA125. Recommend patient to have dermatology evaluation annually.  Epigastric discomfort, acid reflux, recommend patient to take over-the-counter omeprazole.  Refer to gastroenterology.  Follow up: 1 year with mammogram, lab  CBC, CMP, check CA-125  Orders Placed This Encounter  Procedures  . MR BREAST BILATERAL W WO CONTRAST INC CAD    Standing Status:   Future    Standing Expiration Date:   03/17/2022    Order Specific Question:   If indicated for the ordered procedure, I authorize the administration of contrast media per Radiology protocol     Answer:   Yes    Order Specific Question:   What is the patient's sedation requirement?    Answer:   No Sedation    Order Specific Question:   Does the patient have a pacemaker or implanted devices?  Answer:   No    Order Specific Question:   Radiology Contrast Protocol - do NOT remove file path    Answer:   [email or web address]    Order Specific Question:   Preferred imaging location?    Answer:   [facility] (table limit - 550lbs)  . MR Abdomen W Wo Contrast    Standing Status:   Future    Standing Expiration Date:   03/17/2022    Order Specific Question:   If indicated for the ordered procedure, I authorize the administration of contrast media per Radiology protocol    Answer:   Yes    Order Specific Question:   What is the patient's sedation requirement?    Answer:   No Sedation    Order Specific Question:   Does the patient have a pacemaker or implanted devices?    Answer:   No    Order Specific Question:   Preferred imaging location?    Answer:   [facility] (table limit - 550lbs)  . CBC with Differential/Platelet    Standing Status:   Future    Number of Occurrences:   1    Standing Expiration Date:   03/17/2022  . Comprehensive metabolic panel    Standing Status:   Future    Number of Occurrences:   1    Standing Expiration Date:   03/17/2022  . CA 125    Standing Status:   Future    Number of Occurrences:   1    Standing Expiration Date:   03/17/2022  . CBC with Differential/Platelet    Standing Status:   Future    Standing Expiration Date:   03/17/2022  . Comprehensive metabolic panel    Standing Status:   Future    Standing Expiration Date:   03/17/2022  . CA 125    Standing Status:   Future    Standing Expiration Date:   03/17/2022  . Ambulatory referral to Gastroenterology    Referral Priority:   Routine    Referral Type:   Consultation    Referral Reason:   Specialty Services Required    Referred to Provider:    Efrain Sella, MD    Number of Visits Requested:   1    All questions were answered. The patient knows to call the clinic with any problems questions or concerns.  Earlie Server, MD, PhD Hematology Oncology [facility] at [facility] Pager- [phone] 03/17/2021

## 2021-03-18 LAB — CA 125: Cancer Antigen (CA) 125: 7.2 U/mL (ref 0.0–38.1)

## 2021-04-03 ENCOUNTER — Ambulatory Visit: Admission: RE | Admit: 2021-04-03 | Payer: BC Managed Care – PPO | Source: Ambulatory Visit

## 2021-04-05 ENCOUNTER — Other Ambulatory Visit: Payer: Self-pay | Admitting: Podiatry

## 2021-04-05 NOTE — Telephone Encounter (Signed)
Please Advise

## 2021-04-27 ENCOUNTER — Other Ambulatory Visit: Payer: Self-pay

## 2021-04-27 ENCOUNTER — Ambulatory Visit (INDEPENDENT_AMBULATORY_CARE_PROVIDER_SITE_OTHER): Payer: BC Managed Care – PPO | Admitting: Podiatry

## 2021-04-27 ENCOUNTER — Encounter: Payer: Self-pay | Admitting: *Deleted

## 2021-04-27 DIAGNOSIS — G5792 Unspecified mononeuropathy of left lower limb: Secondary | ICD-10-CM

## 2021-04-27 DIAGNOSIS — M722 Plantar fascial fibromatosis: Secondary | ICD-10-CM

## 2021-04-27 DIAGNOSIS — M79672 Pain in left foot: Secondary | ICD-10-CM

## 2021-04-27 MED ORDER — BETAMETHASONE SOD PHOS & ACET 6 (3-3) MG/ML IJ SUSP
3.0000 mg | Freq: Once | INTRAMUSCULAR | Status: AC
  Administered 2021-04-27: 3 mg via INTRA_ARTICULAR

## 2021-04-27 NOTE — Progress Notes (Addendum)
   Subjective: 65 y.o. female presenting for follow-up evaluation of left heel pain that's been ongoing for over 6 months now.  Patient states that the injections helped but only temporarily.  Most recently the pain has been exacerbated and especially more painful now than it ever has been.  She is very concerned and stressed today.  Patient is also concerned due to the history of stepping on a nail to the heel several years ago.  She feels that this may be complicating her symptoms and her heel pain.  Patient states that since that time she has had what she describes as nerve pain to the heel area.  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    Past Medical History:  Diagnosis Date   Asthma    BRCA1 gene mutation positive    Cancer ([place]) 2006   ovarian cancer 2006   Gestational diabetes    Hyperthyroidism    Hypothyroidism    Kidney stones    Personal history of chemotherapy 2006   ovarian ca   Sleep apnea    CPAP     Objective: Physical Exam General: The patient is alert and oriented x3 in no acute distress.  Dermatology: Skin is warm, dry and supple bilateral lower extremities. Negative for open lesions or macerations bilateral. Thickened discolored nails noted digits 1-5 bilateral.   Vascular: Dorsalis Pedis and Posterior Tibial pulses palpable bilateral.  Capillary fill time is immediate to all digits.  Neurological: Epicritic and protective threshold intact bilateral.   Musculoskeletal: Tenderness to palpation to the plantar aspect of the left heel along the plantar fascia. All other joints range of motion within normal limits bilateral. Strength 5/5 in all groups bilateral.  Angie wake up  Assessment: 1. Plantar fasciitis left foot 2. Onychomycosis of toenails 1-5 bilateral 3.  Neuritis left heel  Plan of Care:  1. Patient evaluated.  2. Injection of 0.5cc Celestone soluspan injected into the left  plantar fascia.  3.  The patient has now had this pain for over 6 months and despite conservative treatments has not improved.  She states the pain has actually gotten worse with time.  Today we are going to order MRI left foot without contrast  4.  Continue meloxicam and gabapentin as needed  5.  Continue OTC Aetrex insoles and good supportive sneakers 6.  Patient did not ever take the oral Lamisil as prescribed due to concern for stress on the liver during the medication.  She prefer not to take any oral medication which is fine. 7.  Return to clinic after MRI to review results and discuss further treatment options.  *Art Statistician at [place] M. Valerye Kobus, DPM [facility]  Dr. Edrick Kins, DPM    [address], [place] [address]                Office [phone]  Fax [phone]

## 2021-05-07 ENCOUNTER — Telehealth: Payer: Self-pay | Admitting: Podiatry

## 2021-05-07 NOTE — Addendum Note (Signed)
Addended by: Edrick Kins on: 05/07/2021 03:55 PM   Modules accepted: Orders

## 2021-05-07 NOTE — Telephone Encounter (Signed)
Done. Order placed at [facility] hospital

## 2021-05-07 NOTE — Telephone Encounter (Signed)
Pt called requesting her MRI to be completed in [place] and not [place]. She would like those adjustments to be made. Please advise.

## 2021-05-09 ENCOUNTER — Other Ambulatory Visit: Payer: Self-pay | Admitting: Podiatry

## 2021-05-12 ENCOUNTER — Telehealth: Payer: Self-pay

## 2021-05-12 NOTE — Telephone Encounter (Signed)
Joyce Decker, this patient is wanting to go to [facility] for her MRI.  You will have to request authorization, because [facility] does not do that.  Once approved, the patient will need to call to schedule appt.

## 2021-05-17 NOTE — Telephone Encounter (Signed)
Thank you Evelyn.

## 2021-05-28 ENCOUNTER — Telehealth: Payer: Self-pay | Admitting: Family Medicine

## 2021-05-28 ENCOUNTER — Ambulatory Visit: Payer: BC Managed Care – PPO

## 2021-05-28 NOTE — Telephone Encounter (Signed)
Medication Refill - Medication: contrave This med is not on patients med list bus she says she had this with Dr Marlyn Corporal Has the patient contacted their pharmacy? yes (Agent: If no, request that the patient contact the pharmacy for the refill.) (Agent: If yes, when and what did the pharmacy advise?)contact pcp  Preferred Pharmacy (with phone number or street name) [id number] [address], [place] Phone:  [phone]  Fax:  [phone]      Agent: Please be advised that RX refills may take up to 3 business days. We ask that you follow-up with your pharmacy.

## 2021-05-28 NOTE — Telephone Encounter (Signed)
This medication was stopped in December 2021 per medication history. Will need follow up appointment to weigh and get labs before restarting. Has an appointment schedule on 06-11-21.

## 2021-05-28 NOTE — Telephone Encounter (Signed)
Medication requested not on current list

## 2021-06-11 ENCOUNTER — Other Ambulatory Visit: Payer: Self-pay

## 2021-06-11 ENCOUNTER — Ambulatory Visit: Payer: BC Managed Care – PPO | Admitting: Family Medicine

## 2021-06-11 ENCOUNTER — Encounter: Payer: Self-pay | Admitting: Family Medicine

## 2021-06-11 VITALS — BP 115/80 | HR 87 | Resp 16 | Ht 64.0 in | Wt 173.0 lb

## 2021-06-11 DIAGNOSIS — E663 Overweight: Secondary | ICD-10-CM | POA: Diagnosis not present

## 2021-06-11 MED ORDER — NALTREXONE-BUPROPION HCL ER 8-90 MG PO TB12: ORAL_TABLET | ORAL | 1 refills | Status: DC

## 2021-06-11 NOTE — Progress Notes (Signed)
I,April Miller,acting as a Education administrator for [employer], PA-C.,have documented all relevant documentation on the behalf of Vernie Murders, PA-C,as directed by  [employer], PA-C while in the presence of [employer], PA-C.   Established patient visit   Patient: Joyce Decker   DOB: February 28, 1956   65 y.o. Female  MRN: [id number] Visit Date: 06/11/2021  Today's healthcare provider: Vernie Murders, PA-C   Chief Complaint  Patient presents with   Follow-up   Weight Loss   Subjective    HPI  Follow up for Weight loss counseling  The patient was last seen for this 10/08/2018. Changes made at last visit include; patient was on Contrave and had success. She was changed to [place]. Patient wants to go back on Contrave, because she has gain all her weight back  She reports fair compliance with treatment. She feels that condition is Worse. She is not having side effects. none  ----------------------------------------------------------------------------   Past Medical History:  Diagnosis Date   Asthma    BRCA1 gene mutation positive    Cancer ([place]) 2006   ovarian cancer 2006   Gestational diabetes    Hyperthyroidism    Hypothyroidism    Kidney stones    Personal history of chemotherapy 2006   ovarian ca   Sleep apnea    CPAP   Past Surgical History:  Procedure Laterality Date   ABDOMINAL HYSTERECTOMY Bilateral    AND BSO   COLONOSCOPY WITH PROPOFOL N/A 07/03/2015   Procedure: COLONOSCOPY WITH PROPOFOL;  Surgeon: Josefine Class, MD;  Location: [facility] ENDOSCOPY;  Service: Endoscopy;  Laterality: N/A;   Dental Implant     OOPHORECTOMY     TONSILLECTOMY AND ADENOIDECTOMY     Social History   Tobacco Use   Smoking status: Former   Smokeless tobacco: Never   Tobacco comments:    35 years ago  Vaping Use   Vaping Use: Never used  Substance Use Topics   Alcohol use: No   Drug use: No   Family Status  Relation Name Status   Mother  Alive   Father   Deceased       [place]  Deceased       OVARIAN CANCER   Mat Uncle  Deceased       COLON CANCER   MGM  Deceased       BRAIN TUMOR   Cousin maternal Alive   Other maternal great grandmoth Nurse, adult  (Not Specified)   Administrator  (Not Specified)   Cousin maternal Alive   Cousin maternal Alive   Cousin maternal 30   Son  Alive   Son  Alive   Son  Alive   Cousin maternal Alive   Cousin maternal Alive   Allergies  Allergen Reactions   Other Other (See Comments)    Wheat barley and Grain unknown allergy, possible cause headache.   Tape Other (See Comments)    Blisters   Gadobenate Nausea Only    PT became nauseous with contrast, but did not vomit.     Iodinated Diagnostic Agents Nausea Only       Medications: Outpatient Medications Prior to Visit  Medication Sig   BIOTIN PO Take 1 tablet by mouth daily.    calcium-vitamin D (OSCAL WITH D) 500-200 MG-UNIT per tablet Take 1 tablet by mouth.   Cinnamon 500 MG capsule Take 500 mg by mouth daily.   CRANBERRY PO Take 1 capsule  by mouth daily.   gabapentin (NEURONTIN) 100 MG capsule Take 1 capsule (100 mg total) by mouth 3 (three) times daily.   Melatonin 3 MG TABS Take 1 tablet by mouth at bedtime.   meloxicam (MOBIC) 15 MG tablet TAKE 1 TABLET BY MOUTH EVERY DAY   Multiple Vitamin (MULTIVITAMIN) capsule Take 1 capsule by mouth daily.   terbinafine (LAMISIL) 250 MG tablet TAKE 1 TABLET BY MOUTH EVERY DAY   [DISCONTINUED] Rimegepant Sulfate (NURTEC) 75 MG TBDP Take 75 mg by mouth daily as needed. (Patient not taking: No sig reported)   No facility-administered medications prior to visit.    Review of Systems  Constitutional:  Negative for appetite change, chills, fatigue and fever.  Respiratory:  Negative for chest tightness and shortness of breath.   Cardiovascular:  Negative for chest pain and palpitations.  Gastrointestinal:  Negative for abdominal pain, nausea and vomiting.  Neurological:  Negative  for dizziness and weakness.      Objective    BP 115/80 (BP Location: Right Arm, Patient Position: Sitting, Cuff Size: Normal)   Pulse 87   Resp 16   Ht _0  (1.626 m)   Wt 173 lb (78.5 kg)   SpO2 95%   BMI 29.70 kg/m  BP Readings from Last 3 Encounters:  06/11/21 115/80  03/17/21 115/74  03/11/20 124/89   Wt Readings from Last 3 Encounters:  06/11/21 173 lb (78.5 kg)  03/17/21 169 lb (76.7 kg)  11/25/20 165 lb (74.8 kg)    Physical Exam Constitutional:      General: She is not in acute distress.    Appearance: She is well-developed. She is obese.  HENT:     Head: Normocephalic and atraumatic.     Right Ear: Hearing and tympanic membrane normal.     Left Ear: Hearing and tympanic membrane normal.     Nose: Nose normal.  Eyes:     General: Lids are normal. No scleral icterus.       Right eye: No discharge.        Left eye: No discharge.     Conjunctiva/sclera: Conjunctivae normal.  Cardiovascular:     Rate and Rhythm: Normal rate and regular rhythm.     Pulses: Normal pulses.     Heart sounds: Normal heart sounds.  Pulmonary:     Effort: Pulmonary effort is normal. No respiratory distress.     Breath sounds: Normal breath sounds.  Abdominal:     General: Bowel sounds are normal.     Palpations: Abdomen is soft.  Musculoskeletal:        General: Normal range of motion.     Cervical back: Neck supple.  Skin:    Findings: No lesion or rash.  Neurological:     Mental Status: She is alert and oriented to person, place, and time.  Psychiatric:        Speech: Speech normal.        Behavior: Behavior normal.        Thought Content: Thought content normal.      No results found for any visits on 06/11/21.  Assessment & Plan     1. Overweight with body mass index (BMI) 25.0-29.9 BMI back up to 29.70. Counseled for 15 minutes regarding diet, exercise and risks of using diet pills (Contrave). Will refill medication and get follow up labs. Should schedule follow  up appointment pending lab reports. - Naltrexone-buPROPion HCl ER 8-90 MG TB12; Start 1 tablet every morning for 7  days, then 1 tablet twice daily for 7 days, then 2 tablets every morning and one in the evening  Dispense: 120 tablet; Refill: 1 - CBC with Differential/Platelet - Comprehensive metabolic panel - Hemoglobin A1c - TSH   No follow-ups on file.      I, Dilraj Killgore, PA-C, have reviewed all documentation for this visit. The documentation on 06/11/21 for the exam, diagnosis, procedures, and orders are all accurate and complete.    Vernie Murders, PA-C  [employer] [phone] (phone) [phone] (fax)  [place]

## 2021-07-05 ENCOUNTER — Telehealth: Payer: Self-pay

## 2021-07-05 MED ORDER — VALACYCLOVIR HCL 1 G PO TABS: ORAL_TABLET | ORAL | 0 refills | Status: DC

## 2021-07-05 NOTE — Telephone Encounter (Signed)
Pt called requesting refill of Valtrex for fever blisters, per [facility] approved 1 RF. Retin-A RF denied.  Patient scheduled for follow up appointment to continue refills.  Valtrex sent in to [place].

## 2021-07-08 ENCOUNTER — Ambulatory Visit: Payer: BC Managed Care – PPO | Admitting: Dermatology

## 2021-07-31 ENCOUNTER — Ambulatory Visit: Payer: BC Managed Care – PPO

## 2021-08-07 ENCOUNTER — Ambulatory Visit: Payer: BC Managed Care – PPO

## 2021-08-17 ENCOUNTER — Ambulatory Visit: Admission: RE | Admit: 2021-08-17 | Payer: BC Managed Care – PPO | Source: Ambulatory Visit

## 2021-08-20 ENCOUNTER — Encounter: Payer: Self-pay | Admitting: Family Medicine

## 2021-08-24 ENCOUNTER — Other Ambulatory Visit: Payer: Self-pay

## 2021-08-24 ENCOUNTER — Ambulatory Visit: Payer: BC Managed Care – PPO | Admitting: Dermatology

## 2021-08-24 DIAGNOSIS — L578 Other skin changes due to chronic exposure to nonionizing radiation: Secondary | ICD-10-CM | POA: Diagnosis not present

## 2021-08-24 DIAGNOSIS — L811 Chloasma: Secondary | ICD-10-CM

## 2021-08-24 DIAGNOSIS — L820 Inflamed seborrheic keratosis: Secondary | ICD-10-CM | POA: Diagnosis not present

## 2021-08-24 DIAGNOSIS — B079 Viral wart, unspecified: Secondary | ICD-10-CM

## 2021-08-24 DIAGNOSIS — L988 Other specified disorders of the skin and subcutaneous tissue: Secondary | ICD-10-CM

## 2021-08-24 MED ORDER — TRETINOIN 0.025 % EX CREA: TOPICAL_CREAM | Freq: Every day | CUTANEOUS | 6 refills | Status: DC

## 2021-08-24 NOTE — Progress Notes (Signed)
New Patient Visit  Subjective  Joyce Decker is a 65 y.o. female who presents for the following: check spot (R face, 22m, no symptoms, pt has had in past and it cleared/L arm, >49yrs, is growing, ) and hx of melasma (L neck, used retionoid in past and would like a prescription).  The following portions of the chart were reviewed this encounter and updated as appropriate:   Tobacco  Allergies  Meds  Problems  Med Hx  Surg Hx  Fam Hx     Review of Systems:  No other skin or systemic complaints except as noted in HPI or Assessment and Plan.  Objective  Well appearing patient in no apparent distress; mood and affect are within normal limits.  A focused examination was performed including face, left arm, left hand . Relevant physical exam findings are noted in the Assessment and Plan.  face Rhytides and volume loss.                    L distal lat tricep near elbow x 1, L dorsum hand x 1, Total = 2 (2) Erythematous keratotic or waxy stuck-on papule or plaque.   R mid lat nose x 1 Verrucous papule  neck, chin Reticulated hyperpigmented patches.    Assessment & Plan  Elastosis of skin face  Start Tretinoin 0.025% cr qhs to face  Botox 33units injected today to: - Frown complex 25 units - DAO's 4 units each side  Will recheck in ~3wks to see if we need to add any botox to foerhead  Filling material injection - face Location: Frown complex  Informed consent: Discussed risks (infection, pain, bleeding, bruising, swelling, allergic reaction, paralysis of nearby muscles, eyelid droop, double vision, neck weakness, difficulty breathing, headache, undesirable cosmetic result, and need for additional treatment) and benefits of the procedure, as well as the alternatives.  Informed consent was obtained.  Preparation: The area was cleansed with alcohol.  Procedure Details:  Botox was injected into the dermis with a 30-gauge needle. Pressure applied to any  bleeding. Ice packs offered for swelling.  Lot Number:  [id number] Expiration:  02/2023  Total Units Injected:  33  Plan: Patient was instructed to remain upright for 4 hours. Patient was instructed to avoid massaging the face and avoid vigorous exercise for the rest of the day. Tylenol may be used for headache.  Allow 2 weeks before returning to clinic for additional dosing as needed. Patient will call for any problems.  Related Medications tretinoin (RETIN-A) 0.025 % cream Apply topically at bedtime. Qhs to face  Inflamed seborrheic keratosis L distal lat tricep near elbow x 1, L dorsum hand x 1, Total = 2  Destruction of lesion - L distal lat tricep near elbow x 1, L dorsum hand x 1, Total = 2 Complexity: simple   Destruction method: cryotherapy   Informed consent: discussed and consent obtained   Timeout:  patient name, date of birth, surgical site, and procedure verified Lesion destroyed using liquid nitrogen: Yes   Region frozen until ice ball extended beyond lesion: Yes   Outcome: patient tolerated procedure well with no complications   Post-procedure details: wound care instructions given    Viral warts, unspecified type R mid lat nose x 1 Discussed viral etiology and risk of spread.  Discussed multiple treatments may be required to clear warts.  Discussed possible post-treatment dyspigmentation and risk of recurrence.  Wart vs ISK Destruction of lesion - R mid lat nose x  1 Complexity: simple   Destruction method: cryotherapy   Informed consent: discussed and consent obtained   Timeout:  patient name, date of birth, surgical site, and procedure verified Lesion destroyed using liquid nitrogen: Yes   Region frozen until ice ball extended beyond lesion: Yes   Outcome: patient tolerated procedure well with no complications   Post-procedure details: wound care instructions given    Melasma neck, chin Melasma is a chronic condition of persistent pigmented patches generally  on the face, worse in summer due to higher UV exposure.  Oral estrogen containing BCPs or supplements can exacerbate condition.  Recommend daily broad spectrum tinted sunscreen SPF 30+ to face, preferably with Zinc or Titanium Dioxide. Discussed Rx topical bleaching creams (i.e. hydroquinone), OTC HelioCare supplement, chemical peels (would need multiple for best result).   Start prescription [facility] Hydroquinone 8%, Tretinoin 0.025%, Kojic acid 1%, Niacinamide 4%, Fluocinolone 0.025% cream, a pea sized amount nightly to dark spots on face for up to 2 months. This cannot be used more than 3 months due to risk of skin atrophy (thinning) and exogenous ochronosis (permanent dark spots). The patient was advised this is not covered by insurance. They will receive an email to check out and the medication will be mailed to their home.   Actinic Damage - chronic, secondary to cumulative UV radiation exposure/sun exposure over time - diffuse scaly erythematous macules with underlying dyspigmentation - Recommend daily broad spectrum sunscreen SPF 30+ to sun-exposed areas, reapply every 2 hours as needed.  - Recommend staying in the shade or wearing long sleeves, sun glasses (UVA+UVB protection) and wide brim hats (4-inch brim around the entire circumference of the hat). - Call for new or changing lesions.  Return for 3-4 wks botox f/u.  I, Othelia Pulling, RMA, am acting as scribe for Sarina Ser, MD . Documentation: I have reviewed the above documentation for accuracy and completeness, and I agree with the above.  Sarina Ser, MD

## 2021-08-24 NOTE — Patient Instructions (Addendum)
If you have any questions or concerns for your doctor, please call our main line at [phone] and press option 4 to reach your doctor's medical assistant. If no one answers, please leave a voicemail as directed and we will return your call as soon as possible. Messages left after 4 pm will be answered the following business day.   You may also send [place] a message via [place]. We typically respond to MyChart messages within 1-2 business days.  For prescription refills, please ask your pharmacy to contact our office. Our fax number is [phone].  If you have an urgent issue when the clinic is closed that cannot wait until the next business day, you can page your doctor at the number below.    Please note that while we do our best to be available for urgent issues outside of office hours, we are not available 24/7.   If you have an urgent issue and are unable to reach [place], you may choose to seek medical care at your doctor's office, retail clinic, urgent care center, or emergency room.  If you have a medical emergency, please immediately call 911 or go to the emergency department.  Pager Numbers  - Dr. Nehemiah Massed: [phone]  - Dr. Laurence Ferrari: [phone]  - Dr. Nicole Kindred: [phone]  In the event of inclement weather, please call our main line at [phone] for an update on the status of any delays or closures.  Dermatology Medication Tips: Please keep the boxes that topical medications come in in order to help keep track of the instructions about where and how to use these. Pharmacies typically print the medication instructions only on the boxes and not directly on the medication tubes.   If your medication is too expensive, please contact our office at [phone] option 4 or send [place] a message through [place].   We are unable to tell what your co-pay for medications will be in advance as this is different depending on your insurance coverage. However, we may be able to find a substitute  medication at lower cost or fill out paperwork to get insurance to cover a needed medication.   If a prior authorization is required to get your medication covered by your insurance company, please allow [place] 1-2 business days to complete this process.  Drug prices often vary depending on where the prescription is filled and some pharmacies may offer cheaper prices.  The website www.goodrx.com contains coupons for medications through different pharmacies. The prices here do not account for what the cost may be with help from insurance (it may be cheaper with your insurance), but the website can give you the price if you did not use any insurance.  - You can print the associated coupon and take it with your prescription to the pharmacy.  - You may also stop by our office during regular business hours and pick up a GoodRx coupon card.  - If you need your prescription sent electronically to a different pharmacy, notify our office through [facility] or by phone at [phone] option 4.   Instructions for [facility] Medications  One or more of your medications was sent to the [facility] mail order compounding pharmacy. You will receive an email from them and can purchase the medicine through that link. It will then be mailed to your home at the address you confirmed. If for any reason you do not receive an email from them, please check your spam folder. If you still do not find the  email, please let us know. [facility] phone number is [phone].      BEFORE YOUR APPOINTMENT FOR SCLEROTHERAPY  1. When you telephone for your appointment for the sclerotherapy procedure, please let the receptionist know that you are scheduling for the fifteen (15) minute sclerotherapy procedure not just a regular visit.  2. On the day of the procedure, please cleanse and dry the areas, but do not use any moisturizers or other products on the area(s) to be treated.  3. Bring a pair of  comfortable shorts to wear during the procedure.  4. Be sure to bring your recommended graduated compression stockings with you to the office. You will be wearing them home when your visit is over. These compression hose can be purchased at most medical supply stores.  After Your Sclerotherapy Procedure  1. Please wear the graduated compression stockings for 24 hours immediately following the completion of the sclerotherapy procedure.  2. We recommend that you avoid vigorous activity as much as possible for the first twenty-four (24) hours. You can do your "normal" routine, but avoid an above normal amount of time on your feet. Elevating the legs when sitting and avoidance of vigorous leg movements or exercise in the first few days after treatment may improve your results.  3. You may remove the compression dressings (cotton balls) and tape the next morning.  4. Please continue wearing the compression stockings during waking hours for the two (2) weeks following sclerotherapy.  5. If you have any blisters, sores or ulcers or other problems following your procedure please call or return to the office immediately.     THE PROCEDURE FEE IS $350.00 PER FIFTEEN (15) MINUTE SESSION. WE REQUIRE THAT THIS PROCEDURE BE PAID FOR IN FULL ON OR BEFORE THE DATE THAT IT IS PERFORMED. WE WILL GIVE YOU A RECEIPT THAT YOU CAN FILE WITH YOUR INSURANCE COMPANY. WE GENERALLY DO NOT FILE THIS PROCEDURE WITH ANY INSURANCE COMPANY EXCEPT UNDER CERTAIN CIRCUMSTANCES WHERE PRIOR AUTHORIZATION HAS BEEN CONFIRMED. THIS PROCEDURE IS GENERALLY CONSIDERED TO BE A COSMETIC PROCEDURE BY INSURANCE COMPANIES.

## 2021-08-26 ENCOUNTER — Encounter: Payer: Self-pay | Admitting: Dermatology

## 2021-09-09 ENCOUNTER — Other Ambulatory Visit: Payer: Self-pay | Admitting: Podiatry

## 2021-09-11 ENCOUNTER — Other Ambulatory Visit: Payer: Self-pay | Admitting: Podiatry

## 2021-09-11 IMAGING — MG DIGITAL SCREENING BILAT W/ TOMO W/ CAD
8 series · 8 of 24 positions shown · non-contrast
Comparison: Previous exam(s).

CLINICAL DATA: Screening.

EXAM:
DIGITAL SCREENING BILATERAL MAMMOGRAM WITH TOMO AND CAD

[L CC synth-2D]
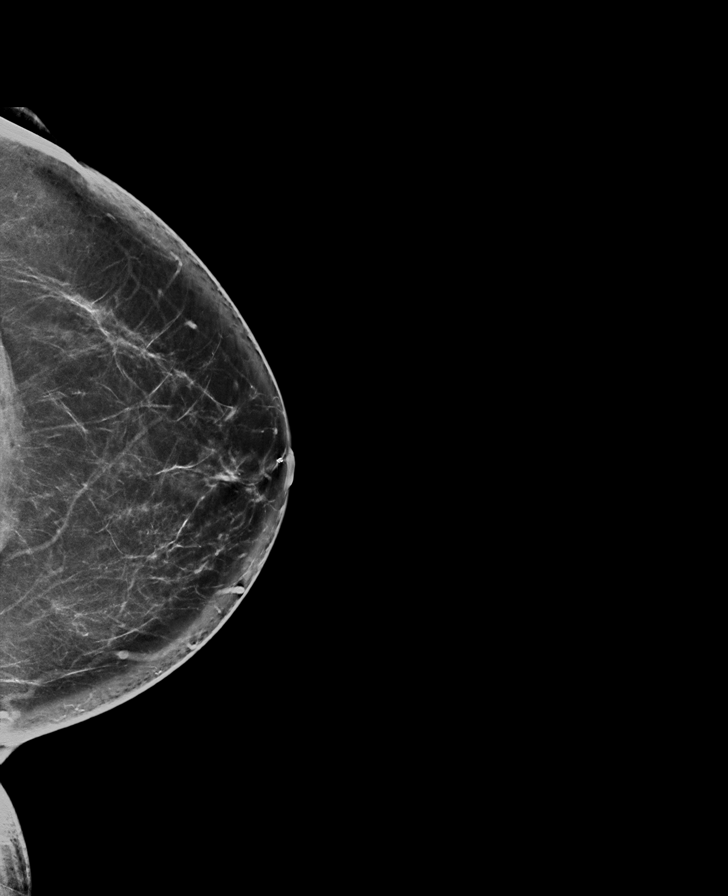

[R MLO synth-2D]
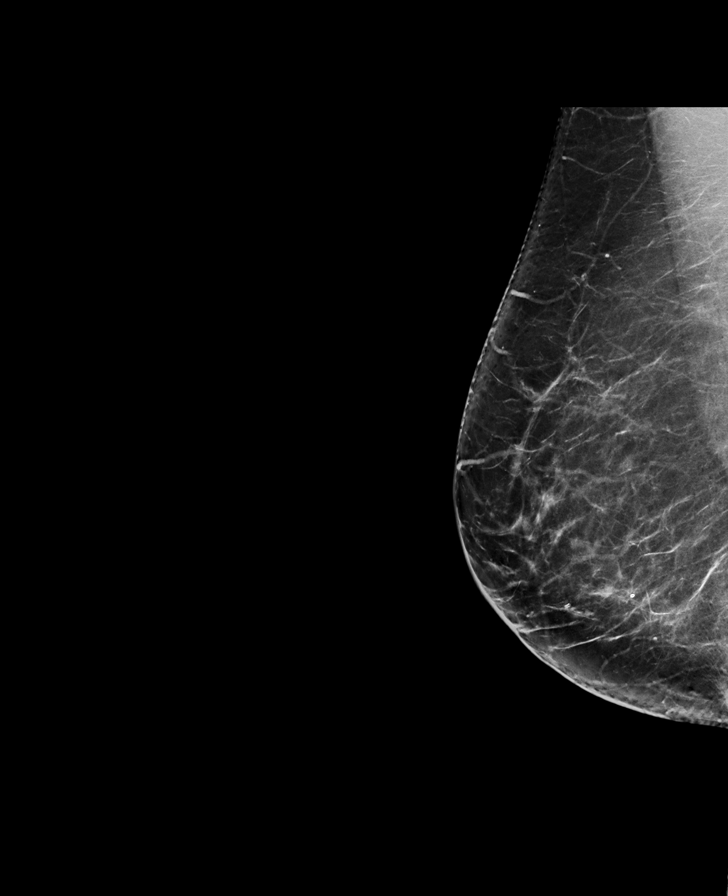

[L MLO synth-2D]
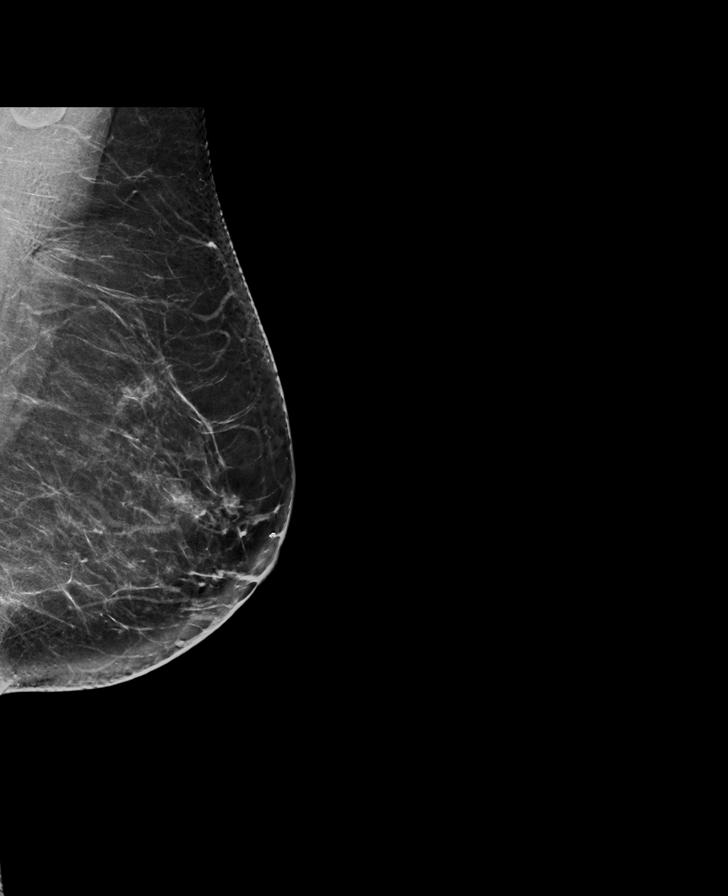

[R CC synth-2D]
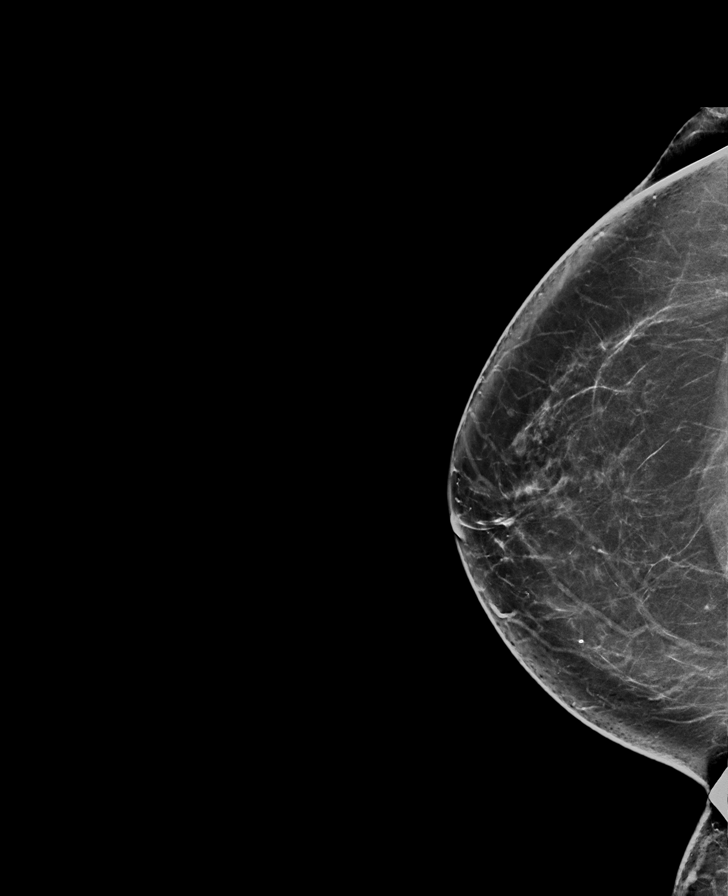

[L MLO tomo · tomo slice 43/84.0]
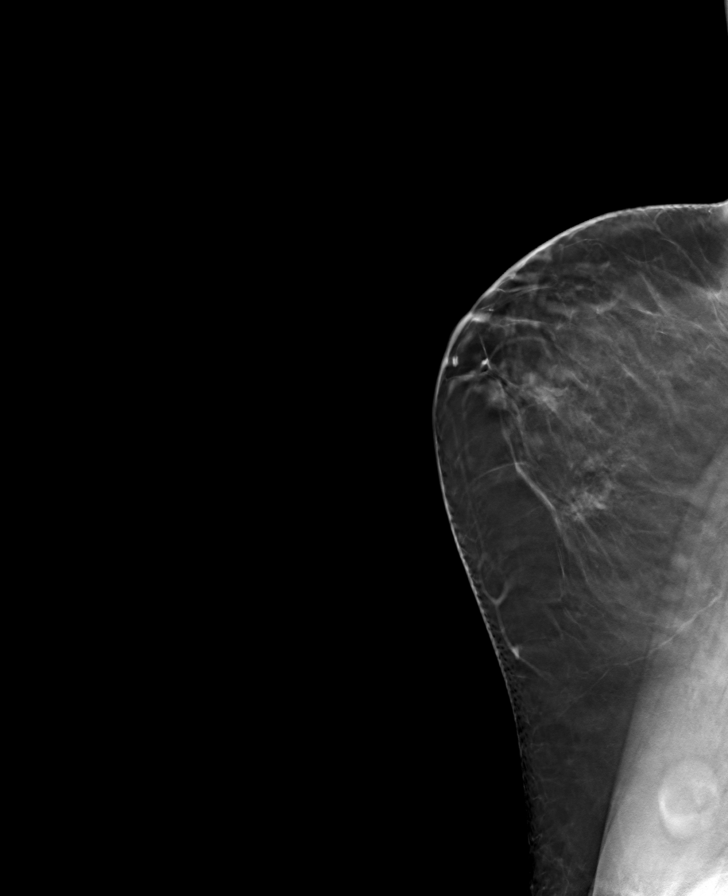

[R MLO tomo · tomo slice 41/80.0]
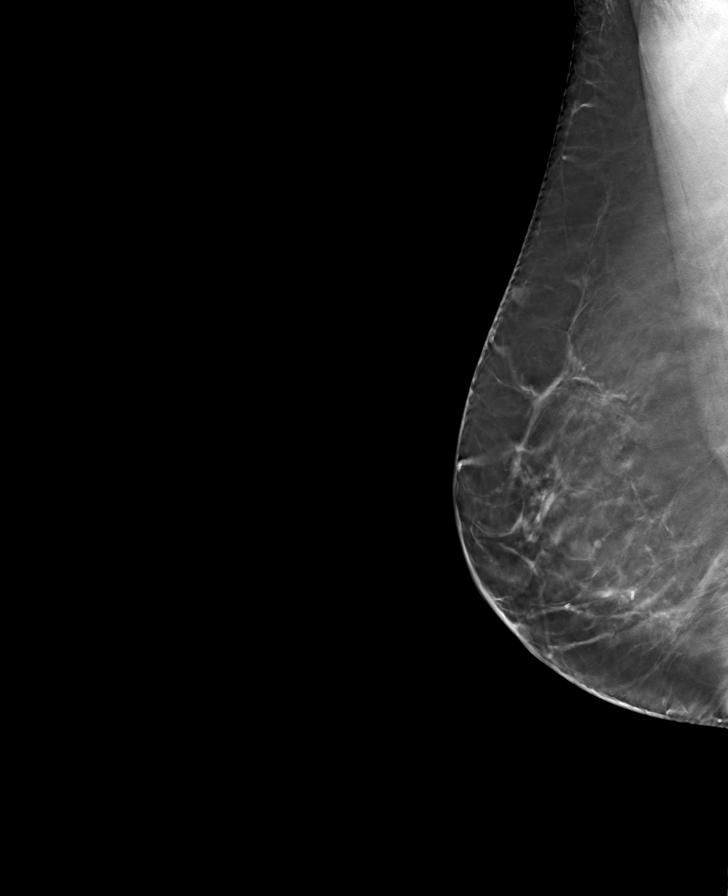

[L CC tomo · tomo slice 43/85.0]
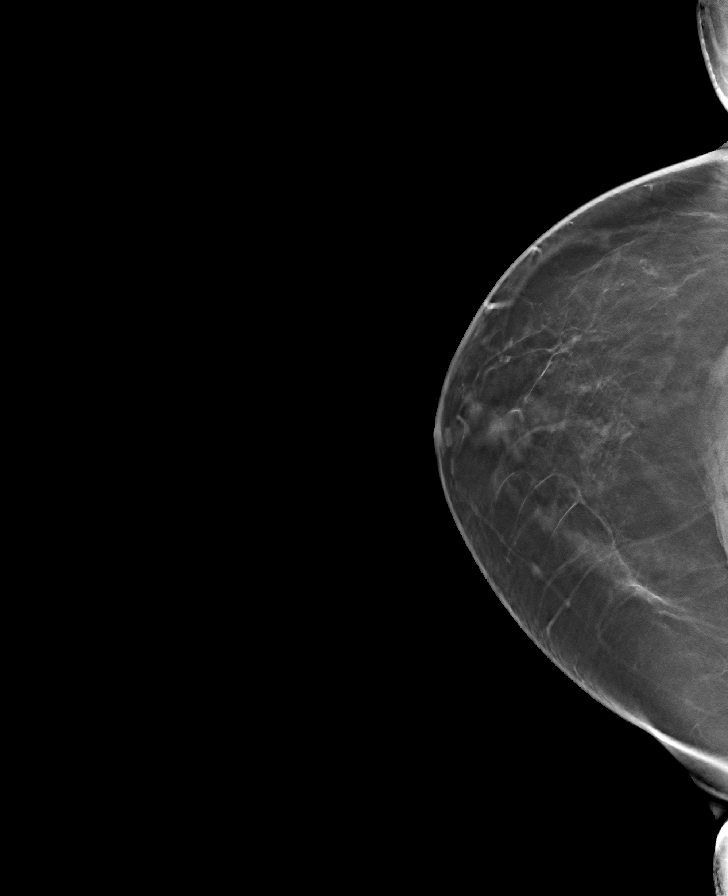

[R CC tomo · tomo slice 41/80.0]
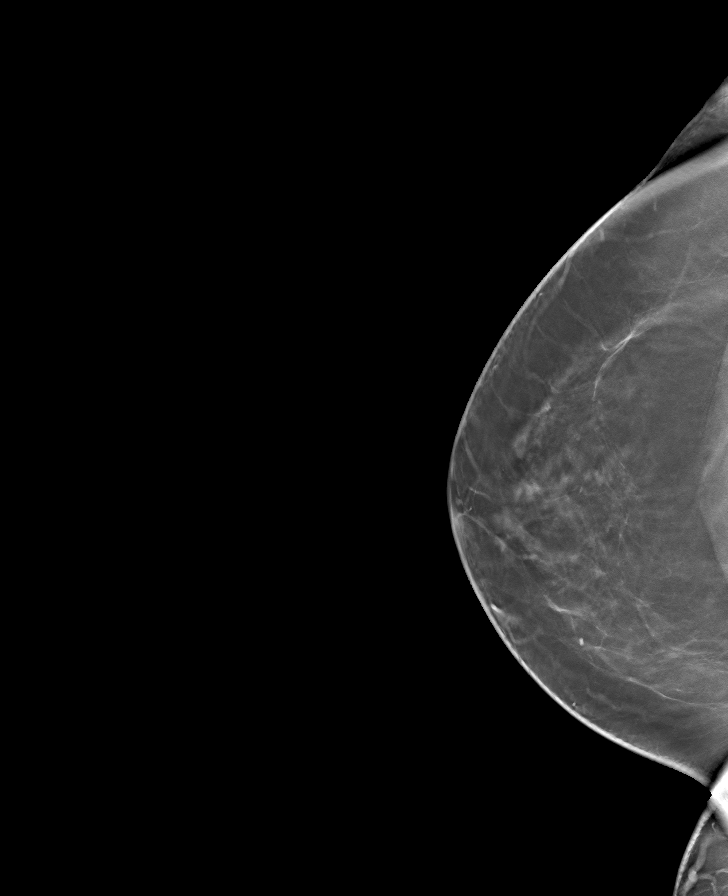

[8 of 24 positions shown; findings below may reference images not displayed]

ACR Breast Density Category b: There are scattered areas of
fibroglandular density.
FINDINGS: There are no findings suspicious for malignancy. Images were
processed with CAD.
IMPRESSION: No mammographic evidence of malignancy. A result letter of this
screening mammogram will be mailed directly to the patient.

RECOMMENDATION:
Screening mammogram in one year. (Code:[id number]-775)

BI-RADS CATEGORY  1: Negative.

## 2021-09-16 ENCOUNTER — Telehealth: Payer: BC Managed Care – PPO | Admitting: Physician Assistant

## 2021-09-16 DIAGNOSIS — J019 Acute sinusitis, unspecified: Secondary | ICD-10-CM | POA: Diagnosis not present

## 2021-09-16 DIAGNOSIS — B9689 Other specified bacterial agents as the cause of diseases classified elsewhere: Secondary | ICD-10-CM | POA: Diagnosis not present

## 2021-09-16 MED ORDER — AMOXICILLIN-POT CLAVULANATE 875-125 MG PO TABS: 1.0000 | ORAL_TABLET | Freq: Two times a day (BID) | ORAL | 0 refills | Status: DC

## 2021-09-16 NOTE — Progress Notes (Signed)

## 2021-09-16 NOTE — Progress Notes (Signed)
I have spent 5 minutes in review of e-visit questionnaire, review and updating patient chart, medical decision making and response to patient.   Tiago Humphrey Cody Mead Slane, PA-C    

## 2021-09-21 ENCOUNTER — Ambulatory Visit: Payer: BC Managed Care – PPO | Admitting: Dermatology

## 2021-10-04 ENCOUNTER — Telehealth: Payer: Self-pay | Admitting: Family Medicine

## 2021-10-04 NOTE — Telephone Encounter (Signed)
Pt called stating that she had a an E Visit on 09/16/21. She states that the antibiotic that she was given cleared up her sinus infection, but now it is coming back. Pt requesting to have a refill or another medication sent in for her. Pt states that she is experiencing cough, runny nose, yellowish mucus, and fever. Please advise.        [id number] [address], [place] [address]  Phone: [phone] Fax: [phone]  Hours: Not open 24 hours

## 2021-10-05 NOTE — Telephone Encounter (Signed)
Spoke with patient on the phone, she had a virtual visit with a PA through [facility] on 10/20 and treated for acute bacterial sinusitis, patient states that symptoms did improve after taking antibiotic that was prescribed but states that they have now returned. I informed patient an office visit would be needed before prescribing another antibiotic. 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 Arranged appt on Friday at 1:20 FYI. KW

## 2021-10-08 ENCOUNTER — Ambulatory Visit: Payer: BC Managed Care – PPO | Admitting: Family Medicine

## 2021-10-20 ENCOUNTER — Encounter
Admission: RE | Disposition: A | Discharge status: Home / Self Care | Payer: Self-pay | Source: Home / Self Care | Attending: Internal Medicine

## 2021-10-20 ENCOUNTER — Ambulatory Visit: Payer: BC Managed Care – PPO | Admitting: Anesthesiology

## 2021-10-20 ENCOUNTER — Encounter: Payer: Self-pay | Admitting: Internal Medicine

## 2021-10-20 ENCOUNTER — Ambulatory Visit
Admission: RE | Admit: 2021-10-20 | Discharge: 2021-10-20 | Disposition: A | Discharge status: Home / Self Care | Payer: BC Managed Care – PPO | Attending: Internal Medicine | Admitting: Internal Medicine

## 2021-10-20 DIAGNOSIS — J45909 Unspecified asthma, uncomplicated: Secondary | ICD-10-CM | POA: Diagnosis not present

## 2021-10-20 DIAGNOSIS — E039 Hypothyroidism, unspecified: Secondary | ICD-10-CM | POA: Insufficient documentation

## 2021-10-20 DIAGNOSIS — Z8543 Personal history of malignant neoplasm of ovary: Secondary | ICD-10-CM | POA: Diagnosis not present

## 2021-10-20 DIAGNOSIS — G4730 Sleep apnea, unspecified: Secondary | ICD-10-CM | POA: Diagnosis not present

## 2021-10-20 DIAGNOSIS — Z87891 Personal history of nicotine dependence: Secondary | ICD-10-CM | POA: Insufficient documentation

## 2021-10-20 DIAGNOSIS — K2970 Gastritis, unspecified, without bleeding: Secondary | ICD-10-CM | POA: Diagnosis not present

## 2021-10-20 DIAGNOSIS — K449 Diaphragmatic hernia without obstruction or gangrene: Secondary | ICD-10-CM | POA: Diagnosis not present

## 2021-10-20 DIAGNOSIS — Z8371 Family history of colonic polyps: Secondary | ICD-10-CM | POA: Diagnosis present

## 2021-10-20 DIAGNOSIS — K2100 Gastro-esophageal reflux disease with esophagitis, without bleeding: Secondary | ICD-10-CM | POA: Diagnosis not present

## 2021-10-20 DIAGNOSIS — Z1211 Encounter for screening for malignant neoplasm of colon: Secondary | ICD-10-CM | POA: Insufficient documentation

## 2021-10-20 DIAGNOSIS — K641 Second degree hemorrhoids: Secondary | ICD-10-CM | POA: Diagnosis not present

## 2021-10-20 DIAGNOSIS — K319 Disease of stomach and duodenum, unspecified: Secondary | ICD-10-CM | POA: Insufficient documentation

## 2021-10-20 DIAGNOSIS — K2289 Other specified disease of esophagus: Secondary | ICD-10-CM | POA: Diagnosis not present

## 2021-10-20 DIAGNOSIS — R1013 Epigastric pain: Secondary | ICD-10-CM | POA: Diagnosis present

## 2021-10-20 HISTORY — PX: ESOPHAGOGASTRODUODENOSCOPY: SHX5428

## 2021-10-20 HISTORY — PX: COLONOSCOPY WITH PROPOFOL: SHX5780

## 2021-10-20 SURGERY — EGD (ESOPHAGOGASTRODUODENOSCOPY)
Anesthesia: General

## 2021-10-20 MED ORDER — PROPOFOL 500 MG/50ML IV EMUL
INTRAVENOUS | Status: AC
  Filled 2021-10-20: qty 50

## 2021-10-20 MED ORDER — PROPOFOL 500 MG/50ML IV EMUL
INTRAVENOUS | Status: DC | PRN
  Administered 2021-10-20: 150 ug/kg/min via INTRAVENOUS

## 2021-10-20 MED ORDER — SODIUM CHLORIDE 0.9 % IV SOLN
INTRAVENOUS | Status: DC
  Administered 2021-10-20: 20 mL/h via INTRAVENOUS

## 2021-10-20 MED ORDER — LIDOCAINE HCL (CARDIAC) PF 100 MG/5ML IV SOSY
PREFILLED_SYRINGE | INTRAVENOUS | Status: DC | PRN
  Administered 2021-10-20: 50 mg via INTRAVENOUS

## 2021-10-20 MED ORDER — PROPOFOL 10 MG/ML IV BOLUS
INTRAVENOUS | Status: DC | PRN
  Administered 2021-10-20: 70 mg via INTRAVENOUS

## 2021-10-20 MED ORDER — PHENYLEPHRINE HCL (PRESSORS) 10 MG/ML IV SOLN
INTRAVENOUS | Status: DC | PRN
  Administered 2021-10-20: 80 ug via INTRAVENOUS

## 2021-10-20 NOTE — Op Note (Signed)
[facility] Gastroenterology Patient Name: Joyce Decker Procedure Date: 10/20/2021 9:05 AM MRN: [id number] Account #: [id number] Date of Birth: 1956/11/09 Admit Type: Outpatient Age: 65 Room: [facility] ENDO ROOM 2 Gender: Female Note Status: Finalized Instrument Name: Upper Endoscope [id number] Procedure:             Upper GI endoscopy Indications:           Dyspepsia, Esophageal reflux Providers:             Lorie Apley K. Alice Reichert MD, MD Referring MD:          Mar Daring (Referring MD) Medicines:             Propofol per Anesthesia Complications:         No immediate complications. Procedure:             Pre-Anesthesia Assessment:                        - The risks and benefits of the procedure and the                         sedation options and risks were discussed with the                         patient. All questions were answered and informed                         consent was obtained.                        - Patient identification and proposed procedure were                         verified prior to the procedure by the nurse. The                         procedure was verified in the procedure room.                        - ASA Grade Assessment: III - A patient with severe                         systemic disease.                        - After reviewing the risks and benefits, the patient                         was deemed in satisfactory condition to undergo the                         procedure.                        After obtaining informed consent, the endoscope was                         passed under direct vision. Throughout the procedure,  the patient's blood pressure, pulse, and oxygen                         saturations were monitored continuously. The Endoscope                         was introduced through the mouth, and advanced to the                         third part of duodenum. The upper GI endoscopy was                          accomplished without difficulty. The patient tolerated                         the procedure well. Findings:      The Z-line was irregular and was found at the gastroesophageal junction.       Mucosa was biopsied with a cold forceps for histology. One specimen       bottle was sent to pathology.      A 1 cm hiatal hernia was present.      Diffuse mild inflammation characterized by erythema was found in the       gastric antrum. Biopsies were taken with a cold forceps for Helicobacter       pylori testing.      The examined duodenum was normal.      The exam was otherwise without abnormality. Impression:            - Z-line irregular, at the gastroesophageal junction.                         Biopsied.                        - 1 cm hiatal hernia.                        - Gastritis. Biopsied.                        - Normal examined duodenum.                        - The examination was otherwise normal. Recommendation:        - Await pathology results.                        - Proceed with colonoscopy Procedure Code(s):     --- Professional ---                        [phone], Esophagogastroduodenoscopy, flexible,                         transoral; with biopsy, single or multiple Diagnosis Code(s):     --- Professional ---                        K21.9, Gastro-esophageal reflux disease without                         esophagitis  R10.13, Epigastric pain                        K29.70, Gastritis, unspecified, without bleeding                        K44.9, Diaphragmatic hernia without obstruction or                         gangrene                        K22.8, Other specified diseases of esophagus CPT copyright 2019 American Medical Association. All rights reserved. The codes documented in this report are preliminary and upon coder review may  be revised to meet current compliance requirements. Efrain Sella MD, MD 10/20/2021 9:20:47 AM This  report has been signed electronically. Number of Addenda: 0 Note Initiated On: 10/20/2021 9:05 AM Estimated Blood Loss:  Estimated blood loss: none.      [facility]

## 2021-10-20 NOTE — Anesthesia Preprocedure Evaluation (Signed)
Anesthesia Evaluation  Patient identified by MRN, date of birth, ID band Patient awake    Reviewed: Allergy & Precautions, NPO status , Patient's Chart, lab work & pertinent test results  Airway Mallampati: III  TM Distance: <3 FB Neck ROM: full    Dental  (+) Teeth Intact, Caps   Pulmonary neg pulmonary ROS, sleep apnea , former smoker,    Pulmonary exam normal  + decreased breath sounds      Cardiovascular Exercise Tolerance: Good negative cardio ROS Normal cardiovascular exam Rhythm:Regular     Neuro/Psych negative neurological ROS  negative psych ROS   GI/Hepatic negative GI ROS, Neg liver ROS,   Endo/Other  negative endocrine ROSdiabetes, Well Controlled, Type 2Hypothyroidism   Renal/GU negative Renal ROS  negative genitourinary   Musculoskeletal negative musculoskeletal ROS (+)   Abdominal Normal abdominal exam  (+)   Peds negative pediatric ROS (+)  Hematology negative hematology ROS (+)   Anesthesia Other Findings Past Medical History: No date: Asthma No date: BRCA1 gene mutation positive 2006: Cancer ([place])     Comment:  ovarian cancer 2006 No date: Gestational diabetes No date: Hyperthyroidism No date: Hypothyroidism No date: Kidney stones 2006: Personal history of chemotherapy     Comment:  ovarian ca No date: Sleep apnea     Comment:  CPAP  Past Surgical History: No date: ABDOMINAL HYSTERECTOMY; Bilateral     Comment:  AND BSO 07/03/2015: COLONOSCOPY WITH PROPOFOL; N/A     Comment:  Procedure: COLONOSCOPY WITH PROPOFOL;  Surgeon: Josefine Class, MD;  Location: [facility] ENDOSCOPY;  Service:               Endoscopy;  Laterality: N/A; No date: Dental Implant No date: OOPHORECTOMY No date: TONSILLECTOMY AND ADENOIDECTOMY  BMI    Body Mass Index: 28.29 kg/m      Reproductive/Obstetrics negative OB ROS                             Anesthesia  Physical Anesthesia Plan  ASA: 3  Anesthesia Plan: General   Post-op Pain Management:    Induction: Intravenous  PONV Risk Score and Plan: Propofol infusion and TIVA  Airway Management Planned: Nasal Cannula  Additional Equipment:   Intra-op Plan:   Post-operative Plan:   Informed Consent: I have reviewed the patients History and Physical, chart, labs and discussed the procedure including the risks, benefits and alternatives for the proposed anesthesia with the patient or authorized representative who has indicated his/her understanding and acceptance.     Dental Advisory Given  Plan Discussed with: CRNA and Surgeon  Anesthesia Plan Comments:         Anesthesia Quick Evaluation

## 2021-10-20 NOTE — H&P (Signed)
Outpatient short stay form Pre-procedure 10/20/2021 9:01 AM Lakyn Mantione K. Alice Reichert, M.D.  Primary Physician: Fenton Malling, PA-C  Reason for visit:  Dyspepsia, Family history of colon polyps (mother).  History of present illness:  65 y/o female presents for personal history of dyspepsia, GERD, Dyspepsia. No hemetemesis, melena, hematochezia.  65 year old patient presenting for family history of colon polyps. Patient denies any change in bowel habits, rectal bleeding or involuntary weight loss.     Current Facility-Administered Medications:    0.9 %  sodium chloride infusion, , Intravenous, Continuous, [place], Benay Pike, MD, Last Rate: 20 mL/hr at 10/20/21 0854, 20 mL/hr at 10/20/21 0854  Medications Prior to Admission  Medication Sig Dispense Refill Last Dose   amoxicillin-clavulanate (AUGMENTIN) 875-125 MG tablet Take 1 tablet by mouth 2 (two) times daily. 14 tablet 0 Past Week   BIOTIN PO Take 1 tablet by mouth daily.    Past Week   calcium-vitamin D (OSCAL WITH D) 500-200 MG-UNIT per tablet Take 1 tablet by mouth.   Past Week   Cinnamon 500 MG capsule Take 500 mg by mouth daily.   Past Week   CRANBERRY PO Take 1 capsule by mouth daily.   Past Week   gabapentin (NEURONTIN) 100 MG capsule Take 1 capsule (100 mg total) by mouth 3 (three) times daily. 90 capsule 3 Past Week   Melatonin 3 MG TABS Take 1 tablet by mouth at bedtime.   Past Week   meloxicam (MOBIC) 15 MG tablet TAKE 1 TABLET BY MOUTH EVERY DAY 30 tablet 1 Past Week   Multiple Vitamin (MULTIVITAMIN) capsule Take 1 capsule by mouth daily.   Past Week   Naltrexone-buPROPion HCl ER 8-90 MG TB12 Start 1 tablet every morning for 7 days, then 1 tablet twice daily for 7 days, then 2 tablets every morning and one in the evening 120 tablet 1 Past Week   terbinafine (LAMISIL) 250 MG tablet TAKE 1 TABLET BY MOUTH EVERY DAY 90 tablet 0 Past Week   tretinoin (RETIN-A) 0.025 % cream Apply topically at bedtime. Qhs to face 45 g 6 Past Week    valACYclovir (VALTREX) 1000 MG tablet Take 2 tablets a first sign of flare repeat in 12 hours 30 tablet 0 Past Week     Allergies  Allergen Reactions   Other Other (See Comments)    Wheat barley and Grain unknown allergy, possible cause headache.   Tape Other (See Comments)    Blisters   Gadobenate Nausea Only    PT became nauseous with contrast, but did not vomit.     Iodinated Diagnostic Agents Nausea Only     Past Medical History:  Diagnosis Date   Asthma    BRCA1 gene mutation positive    Cancer ([place]) 2006   ovarian cancer 2006   Gestational diabetes    Hyperthyroidism    Hypothyroidism    Kidney stones    Personal history of chemotherapy 2006   ovarian ca   Sleep apnea    CPAP    Review of systems:  Otherwise negative.    Physical Exam  Gen: Alert, oriented. Appears stated age.  HEENT: [place]/AT. PERRLA. Lungs: CTA, no wheezes. CV: RR nl S1, S2. Abd: soft, benign, no masses. BS+ Ext: No edema. Pulses 2+    Planned procedures: Proceed with EGD and colonoscopy. The patient understands the nature of the planned procedure, indications, risks, alternatives and potential complications including but not limited to bleeding, infection, perforation, damage to internal organs and  possible oversedation/side effects from anesthesia. The patient agrees and gives consent to proceed.  Please refer to procedure notes for findings, recommendations and patient disposition/instructions.     Austen Oyster K. Alice Reichert, M.D. Gastroenterology 10/20/2021  9:01 AM

## 2021-10-20 NOTE — Transfer of Care (Signed)
Immediate Anesthesia Transfer of Care Note  Patient: Joyce Decker  Procedure(s) Performed: ESOPHAGOGASTRODUODENOSCOPY (EGD) COLONOSCOPY WITH PROPOFOL  Patient Location: PACU and Endoscopy Unit  Anesthesia Type:General  Level of Consciousness: drowsy and patient cooperative  Airway & Oxygen Therapy: Patient Spontanous Breathing  Post-op Assessment: Report given to RN and Post -op Vital signs reviewed and stable  Post vital signs: Reviewed and stable  Last Vitals:  Vitals Value Taken Time  BP 89/55 10/20/21 0939  Temp    Pulse 68 10/20/21 0939  Resp 23 10/20/21 0939  SpO2 100 % 10/20/21 0939    Last Pain:  Vitals:   10/20/21 0939  TempSrc:   PainSc: Asleep         Complications: No notable events documented.

## 2021-10-20 NOTE — Op Note (Signed)
[facility] Patient Name: Joyce Decker Procedure Date: 10/20/2021 9:04 AM MRN: [id number] Account #: [id number] Date of Birth: 21-Jul-1956 Admit Type: Outpatient Age: 65 Room: [facility] ENDO ROOM 2 Gender: Female Note Status: Finalized Instrument Name: Jasper Riling [id number] Procedure:             Colonoscopy Indications:           Colon cancer screening in patient at increased risk:                         Family history of 1st-degree relative with colon polyps Providers:             Benay Pike. Alice Reichert MD, MD Referring MD:          Mar Daring (Referring MD) Medicines:             Propofol per Anesthesia Complications:         No immediate complications. Procedure:             Pre-Anesthesia Assessment:                        - The risks and benefits of the procedure and the                         sedation options and risks were discussed with the                         patient. All questions were answered and informed                         consent was obtained.                        - Patient identification and proposed procedure were                         verified prior to the procedure by the nurse. The                         procedure was verified in the procedure room.                        After obtaining informed consent, the colonoscope was                         passed under direct vision. Throughout the procedure,                         the patient's blood pressure, pulse, and oxygen                         saturations were monitored continuously. The                         Colonoscope was introduced through the anus and                         advanced to the the cecum, identified by appendiceal  orifice and ileocecal valve. The colonoscopy was                         performed without difficulty. The patient tolerated                         the procedure well. The quality of the bowel                          preparation was good. The ileocecal valve, appendiceal                         orifice, and rectum were photographed. Findings:      Non-bleeding internal hemorrhoids were found during retroflexion. The       hemorrhoids were Grade I (internal hemorrhoids that do not prolapse).      The perianal exam findings include internal hemorrhoids that prolapse       with straining, but spontaneously regress to the resting position (Grade       II).      The digital rectal exam was normal. Pertinent negatives include normal       sphincter tone.      The colon (entire examined portion) appeared normal. Impression:            - Non-bleeding internal hemorrhoids.                        - Internal hemorrhoids that prolapse with straining,                         but spontaneously regress to the resting position                         (Grade II) found on perianal exam.                        - The entire examined colon is normal.                        - No specimens collected. Recommendation:        - Await pathology results from EGD, also performed                         today.                        - Patient has a contact number available for                         emergencies. The signs and symptoms of potential                         delayed complications were discussed with the patient.                         Return to normal activities tomorrow. Written                         discharge instructions were provided to the patient.                        -  Resume previous diet.                        - Continue present medications.                        - Repeat colonoscopy in 5 years for screening purposes.                        - Return to GI office PRN.                        - The findings and recommendations were discussed with                         the patient. Procedure Code(s):     --- Professional ---                        S0630, Colorectal cancer screening;  colonoscopy on                         individual at high risk Diagnosis Code(s):     --- Professional ---                        K64.1, Second degree hemorrhoids                        Z83.71, Family history of colonic polyps CPT copyright 2019 American Medical Association. All rights reserved. The codes documented in this report are preliminary and upon coder review may  be revised to meet current compliance requirements. Efrain Sella MD, MD 10/20/2021 9:39:34 AM This report has been signed electronically. Number of Addenda: 0 Note Initiated On: 10/20/2021 9:04 AM Scope Withdrawal Time: 0 hours 5 minutes 15 seconds  Total Procedure Duration: 0 hours 8 minutes 44 seconds  Estimated Blood Loss:  Estimated blood loss: none.      [facility]

## 2021-10-20 NOTE — Interval H&P Note (Signed)
History and Physical Interval Note:  10/20/2021 9:04 AM  Joyce Decker  has presented today for surgery, with the diagnosis of Gastroesophageal reflux disease, unspecified whether esophagitis present (K21.9) Family history of polyps in the colon (Z83.71).  The various methods of treatment have been discussed with the patient and family. After consideration of risks, benefits and other options for treatment, the patient has consented to  Procedure(s): ESOPHAGOGASTRODUODENOSCOPY (EGD) (N/A) COLONOSCOPY WITH PROPOFOL (N/A) as a surgical intervention.  The patient's history has been reviewed, patient examined, no change in status, stable for surgery.  I have reviewed the patient's chart and labs.  Questions were answered to the patient's satisfaction.     [place], [place]

## 2021-10-20 NOTE — Anesthesia Procedure Notes (Signed)
Procedure Name: MAC Date/Time: 10/20/2021 9:12 AM Performed by: Jerrye Noble, CRNA Pre-anesthesia Checklist: Patient identified, Emergency Drugs available, Suction available and Patient being monitored Patient Re-evaluated:Patient Re-evaluated prior to induction Oxygen Delivery Method: Nasal cannula

## 2021-10-22 LAB — SURGICAL PATHOLOGY

## 2021-10-25 ENCOUNTER — Ambulatory Visit: Payer: BC Managed Care – PPO | Admitting: Family Medicine

## 2021-10-28 NOTE — Anesthesia Postprocedure Evaluation (Signed)
Anesthesia Post Note  Patient: Joyce Decker  Procedure(s) Performed: ESOPHAGOGASTRODUODENOSCOPY (EGD) COLONOSCOPY WITH PROPOFOL  Patient location during evaluation: PACU Anesthesia Type: General Level of consciousness: awake and awake and alert Pain management: pain level controlled Vital Signs Assessment: post-procedure vital signs reviewed and stable Respiratory status: spontaneous breathing and respiratory function stable Cardiovascular status: blood pressure returned to baseline Anesthetic complications: no   No notable events documented.   Last Vitals:  Vitals:   10/20/21 0959 10/20/21 1009  BP: 105/65 98/65  Pulse: 65 61  Resp: 20 16  Temp:    SpO2: 99% 99%    Last Pain:  Vitals:   10/23/21 1419  TempSrc:   PainSc: 2                  VAN STAVEREN,Claus Silvestro

## 2021-11-14 ENCOUNTER — Other Ambulatory Visit: Payer: Self-pay | Admitting: Podiatry

## 2021-11-14 NOTE — Telephone Encounter (Signed)
Please advise 

## 2021-11-17 ENCOUNTER — Ambulatory Visit: Payer: Self-pay | Admitting: Dermatology

## 2022-02-01 ENCOUNTER — Telehealth: Payer: BC Managed Care – PPO | Admitting: Nurse Practitioner

## 2022-02-01 ENCOUNTER — Ambulatory Visit: Payer: Self-pay | Admitting: *Deleted

## 2022-02-01 DIAGNOSIS — H00011 Hordeolum externum right upper eyelid: Secondary | ICD-10-CM | POA: Diagnosis not present

## 2022-02-01 MED ORDER — BACITRACIN-POLYMYXIN B 500-10000 UNIT/GM OP OINT: 1.0000 "application " | TOPICAL_OINTMENT | Freq: Four times a day (QID) | OPHTHALMIC | 0 refills | Status: AC

## 2022-02-01 MED ORDER — AMOXICILLIN-POT CLAVULANATE 875-125 MG PO TABS: 1.0000 | ORAL_TABLET | Freq: Two times a day (BID) | ORAL | 0 refills | Status: AC

## 2022-02-01 NOTE — Progress Notes (Signed)
?Virtual Visit Consent  ? ?Joyce Decker, you are scheduled for a virtual visit with a [place] provider today.   ?  ?Just as with appointments in the office, your consent must be obtained to participate.  Your consent will be active for this visit and any virtual visit you may have with one of our providers in the next 365 days.   ?  ?If you have a MyChart account, a copy of this consent can be sent to you electronically.  All virtual visits are billed to your insurance company just like a traditional visit in the office.   ? ?As this is a virtual visit, video technology does not allow for your provider to perform a traditional examination.  This may limit your provider's ability to fully assess your condition.  If your provider identifies any concerns that need to be evaluated in person or the need to arrange testing (such as labs, EKG, etc.), we will make arrangements to do so.   ?  ?Although advances in technology are sophisticated, we cannot ensure that it will always work on either your end or our end.  If the connection with a video visit is poor, the visit may have to be switched to a telephone visit.  With either a video or telephone visit, we are not always able to ensure that we have a secure connection.    ? ?I need to obtain your verbal consent now.   Are you willing to proceed with your visit today?  ?  ?Joyce Decker has provided verbal consent on 02/01/2022 for a virtual visit (video or telephone). ?  ?Joyce Schneiders, FNP  ? ?Date: 02/01/2022 3:15 PM ? ? ?Virtual Visit via Video Note  ? ?Joyce Decker, connected with  Joyce Decker  ([id number], [birth year]) on 02/01/22 at  3:15 PM EST by a video-enabled telemedicine application and verified that I am speaking with the correct person using two identifiers. ? ?Location: ?Patient: Virtual Visit Location Patient: Home ?Provider: Virtual Visit Location Provider: Home Office ?  ?I discussed the limitations of evaluation and management by telemedicine and  the availability of in person appointments. The patient expressed understanding and agreed to proceed.   ? ?History of Present Illness: ?Joyce Decker is a 66 y.o. who identifies as a female who was assigned female at birth, and is being seen today with complaints of recurring styes in her right upper eyelid.   ? ?Last year she had one for the first time. She used antibiotic ointment initially for her last infection and she ended up on an oral antibiotic for an orbital cellulitis. ? ?This occurrence started last night.  ?  ?Problems:  ?Patient Active Problem List  ? Diagnosis Date Noted  ? 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 History of migraine headaches 04/09/2015  ? HLD (hyperlipidemia) 04/09/2015  ? Chronic interstitial cystitis 04/09/2015  ? H/O ovarian cancer 06/08/2012  ? H/O renal calculi 06/08/2012  ? Urethral cyst 06/08/2012  ? 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 Awareness of heartbeats 02/19/2010  ? Apnea, sleep 02/19/2010  ? Airway hyperreactivity 05/06/2008  ? Family history of diabetes mellitus 05/06/2008  ? Fam hx-ischem heart disease 05/06/2008  ?  ?Allergies:  ?Allergies  ?Allergen Reactions  ? Other Other (See Comments)  ?  Wheat barley and Grain unknown allergy, possible cause headache.  ? Tape Other (See Comments)  ?  Blisters  ?  Gadobenate Nausea Only  ?  PT became nauseous with contrast, but did not vomit.    ? Iodinated Contrast Media Nausea Only  ? ?Medications:  ?Current Outpatient Medications:  ?  amoxicillin-clavulanate (AUGMENTIN) 875-125 MG tablet, Take 1 tablet by mouth 2 (two) times daily., Disp: 14 tablet, Rfl: 0 ?  BIOTIN PO, Take 1 tablet by mouth daily. , Disp: , Rfl:  ?  calcium-vitamin D (OSCAL WITH D) 500-200 MG-UNIT per tablet, Take 1 tablet by mouth., Disp: , Rfl:  ?  Cinnamon 500 MG capsule, Take 500 mg by mouth daily., Disp: , Rfl:  ?  CRANBERRY PO, Take 1 capsule by mouth daily., Disp: ,  Rfl:  ?  gabapentin (NEURONTIN) 100 MG capsule, Take 1 capsule (100 mg total) by mouth 3 (three) times daily., Disp: 90 capsule, Rfl: 3 ?  Melatonin 3 MG TABS, Take 1 tablet by mouth at bedtime., Disp: , Rfl:  ?  meloxicam (MOBIC) 15 MG tablet, TAKE 1 TABLET BY MOUTH EVERY DAY, Disp: 30 tablet, Rfl: 1 ?  Multiple Vitamin (MULTIVITAMIN) capsule, Take 1 capsule by mouth daily., Disp: , Rfl:  ?  Naltrexone-buPROPion HCl ER 8-90 MG TB12, Start 1 tablet every morning for 7 days, then 1 tablet twice daily for 7 days, then 2 tablets every morning and one in the evening, Disp: 120 tablet, Rfl: 1 ?  terbinafine (LAMISIL) 250 MG tablet, TAKE 1 TABLET BY MOUTH EVERY DAY, Disp: 90 tablet, Rfl: 0 ?  tretinoin (RETIN-A) 0.025 % cream, Apply topically at bedtime. Qhs to face, Disp: 45 g, Rfl: 6 ?  valACYclovir (VALTREX) 1000 MG tablet, Take 2 tablets a first sign of flare repeat in 12 hours, Disp: 30 tablet, Rfl: 0 ? ?Observations/Objective: ?Patient is well-developed, well-nourished in no acute distress.  ?Resting comfortably  at home.  ?Head is normocephalic, atraumatic.  ?No labored breathing.  ?Speech is clear and coherent with logical content.  ?Patient is alert and oriented at baseline.  ?Eye: right upper lid with erythema and notable stye  ?Sclera clear  ? ?Assessment and Plan: ?1. Hordeolum externum of right upper eyelid ? ?- bacitracin-polymyxin b (POLYSPORIN) ophthalmic ointment; Place 1 application. into the right eye 4 (four) times daily for 5 days. apply to eye every 12 hours while awake  Dispense: 3.5 g; Refill: 0 ?- amoxicillin-clavulanate (AUGMENTIN) 875-125 MG tablet; Take 1 tablet by mouth 2 (two) times daily for 7 days. Take with food  Dispense: 14 tablet; Refill: 0 ?   ?Warm compress prior to ointment application.  ? ?Follow Up Instructions: ?I discussed the assessment and treatment plan with the patient. The patient was provided an opportunity to ask questions and all were answered. The patient agreed with the  plan and demonstrated an understanding of the instructions.  A copy of instructions were sent to the patient via MyChart unless otherwise noted below.  ? ? ?The patient was advised to call back or seek an in-person evaluation if the symptoms worsen or if the condition fails to improve as anticipated. ? ?Time:  ?I spent  minutes with the patient via telehealth technology discussing the above problems/concerns.   ? ?Joyce Schneiders, FNP  ?

## 2022-02-01 NOTE — Telephone Encounter (Signed)
?  Chief Complaint: requesting medication or VV appt.  ?Symptoms: stye on right eye upper lid worsening pain, difficulty to see, interferes with normal activities. Teaches school and difficulty getting appt.  ?Frequency: na  ?Pertinent Negatives: Patient denies fever, drainage from eye ?Disposition: '[]'$ ED /'[]'$ Urgent Care (no appt availability in office) / '[]'$ Appointment(In office/virtual)/ '[x]'$  [facility]/ '[]'$ Home Care/ '[]'$ Refused Recommended Disposition /'[]'$  Mobile Bus/ '[]'$  Follow-up with PCP ?Additional Notes:  ? ? 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 Requesting augmentin if possible. Requesting a call back . Reason for Disposition ? [1] After 5 days of treatment per [facility] Advice AND [2] not better ? ?Answer Assessment - Initial Assessment Questions ?1. LOCATION: "Which eye has the sty?" "Upper or lower eyelid?" ?    Right eye upper lid ?2. SIZE: "How big is it?" (Note: standard pencil eraser is 6 mm) ?    Getting bigger and bigger than end of a pen ?3. EYELID: "Is the eyelid swollen?" If Yes, ask: "How much?" ?    Na  ?4. REDNESS: "Has the redness spread onto the eyelid?" ?    na ?5. ONSET: "When did you notice the sty?" ?    Na  ?6. VISION: "Do you have blurred vision?"  ?    No blurred vision c/o interfering with vision and painful to blink ?7. PAIN: "Is it painful?" If Yes, ask: "How bad is the pain?"  (Scale 1-10; or mild, moderate, severe) ?    Getting worse ?8. CONTACTS: "Do you wear contacts?" ?    na ?9. OTHER SYMPTOMS: "Do you have any other symptoms?" (e.g., fever) ?    na ?10. PREGNANCY: "Is there any chance you are pregnant?" "When was your last menstrual period?" ?      na ? ?Protocols used: Sty-A-AH ? ?

## 2022-02-24 ENCOUNTER — Telehealth: Payer: BC Managed Care – PPO | Admitting: Physician Assistant

## 2022-02-24 DIAGNOSIS — B9689 Other specified bacterial agents as the cause of diseases classified elsewhere: Secondary | ICD-10-CM | POA: Diagnosis not present

## 2022-02-24 DIAGNOSIS — J019 Acute sinusitis, unspecified: Secondary | ICD-10-CM | POA: Diagnosis not present

## 2022-02-24 DIAGNOSIS — K5903 Drug induced constipation: Secondary | ICD-10-CM

## 2022-02-24 MED ORDER — DOXYCYCLINE HYCLATE 100 MG PO TABS
100.0000 mg | ORAL_TABLET | Freq: Two times a day (BID) | ORAL | 0 refills | Status: DC
Start: 2022-02-24 — End: 2022-05-12

## 2022-02-24 MED ORDER — IPRATROPIUM BROMIDE 0.03 % NA SOLN: 2.0000 | Freq: Two times a day (BID) | NASAL | 0 refills | Status: DC

## 2022-02-24 NOTE — Patient Instructions (Signed)
?Joyce Decker, thank you for joining Mar Daring, PA-C for today's virtual visit.  While this provider is not your primary care provider (PCP), if your PCP is located in our provider database this encounter information will be shared with them immediately following your visit. ? ?Consent: ?(Patient) Joyce Decker provided verbal consent for this virtual visit at the beginning of the encounter. ? ?Current Medications: ? ?Current Outpatient Medications:  ?  doxycycline (VIBRA-TABS) 100 MG tablet, Take 1 tablet (100 mg total) by mouth 2 (two) times daily., Disp: 20 tablet, Rfl: 0 ?  ipratropium (ATROVENT) 0.03 % nasal spray, Place 2 sprays into both nostrils every 12 (twelve) hours., Disp: 30 mL, Rfl: 0 ?  BIOTIN PO, Take 1 tablet by mouth daily. , Disp: , Rfl:  ?  calcium-vitamin D (OSCAL WITH D) 500-200 MG-UNIT per tablet, Take 1 tablet by mouth., Disp: , Rfl:  ?  Cinnamon 500 MG capsule, Take 500 mg by mouth daily., Disp: , Rfl:  ?  CRANBERRY PO, Take 1 capsule by mouth daily., Disp: , Rfl:  ?  gabapentin (NEURONTIN) 100 MG capsule, Take 1 capsule (100 mg total) by mouth 3 (three) times daily., Disp: 90 capsule, Rfl: 3 ?  Melatonin 3 MG TABS, Take 1 tablet by mouth at bedtime., Disp: , Rfl:  ?  meloxicam (MOBIC) 15 MG tablet, TAKE 1 TABLET BY MOUTH EVERY DAY, Disp: 30 tablet, Rfl: 1 ?  Multiple Vitamin (MULTIVITAMIN) capsule, Take 1 capsule by mouth daily., Disp: , Rfl:  ?  Naltrexone-buPROPion HCl ER 8-90 MG TB12, Start 1 tablet every morning for 7 days, then 1 tablet twice daily for 7 days, then 2 tablets every morning and one in the evening, Disp: 120 tablet, Rfl: 1 ?  terbinafine (LAMISIL) 250 MG tablet, TAKE 1 TABLET BY MOUTH EVERY DAY, Disp: 90 tablet, Rfl: 0 ?  tretinoin (RETIN-A) 0.025 % cream, Apply topically at bedtime. Qhs to face, Disp: 45 g, Rfl: 6 ?  valACYclovir (VALTREX) 1000 MG tablet, Take 2 tablets a first sign of flare repeat in 12 hours, Disp: 30 tablet, Rfl: 0  ? ?Medications  ordered in this encounter:  ?Meds ordered this encounter  ?Medications  ? doxycycline (VIBRA-TABS) 100 MG tablet  ?  Sig: Take 1 tablet (100 mg total) by mouth 2 (two) times daily.  ?  Dispense:  20 tablet  ?  Refill:  0  ?  Order Specific Question:   Supervising Provider  ?  Answer:   Noemi Chapel [[id number]]  ? ipratropium (ATROVENT) 0.03 % nasal spray  ?  Sig: Place 2 sprays into both nostrils every 12 (twelve) hours.  ?  Dispense:  30 mL  ?  Refill:  0  ?  Order Specific Question:   Supervising Provider  ?  Answer:   Noemi Chapel [[id number]]  ?  ? ?*If you need refills on other medications prior to your next appointment, please contact your pharmacy* ? ?Follow-Up: ?Call back or seek an in-person evaluation if the symptoms worsen or if the condition fails to improve as anticipated. ? ?Other Instructions ? ?Constipation, Adult ?Constipation is when a person has fewer than three bowel movements in a week, has difficulty having a bowel movement, or has stools (feces) that are dry, hard, or larger than normal. Constipation may be caused by an underlying condition. It may become worse with age if a person takes certain medicines and does not take in enough fluids. ?Follow these instructions at home: ?Eating  and drinking ? ?Eat foods that have a lot of fiber, such as beans, whole grains, and fresh fruits and vegetables. ?Limit foods that are low in fiber and high in fat and processed sugars, such as fried or sweet foods. These include french fries, hamburgers, cookies, candies, and soda. ?Drink enough fluid to keep your urine pale yellow. ?General instructions ?Exercise regularly or as told by your health care provider. Try to do 150 minutes of moderate exercise each week. ?Use the bathroom when you have the urge to go. Do not hold it in. ?Take over-the-counter and prescription medicines only as told by your health care provider. This includes any fiber supplements. ?During bowel movements: ?Practice deep breathing while  relaxing the lower abdomen. ?Practice pelvic floor relaxation. ?Watch your condition for any changes. Let your health care provider know about them. ?Keep all follow-up visits as told by your health care provider. This is important. ?Contact a health care provider if: ?You have pain that gets worse. ?You have a fever. ?You do not have a bowel movement after 4 days. ?You vomit. ?You are not hungry or you lose weight. ?You are bleeding from the opening between the buttocks (anus). ?You have thin, pencil-like stools. ?Get help right away if: ?You have a fever and your symptoms suddenly get worse. ?You leak stool or have blood in your stool. ?Your abdomen is bloated. ?You have severe pain in your abdomen. ?You feel dizzy or you faint. ?Summary ?Constipation is when a person has fewer than three bowel movements in a week, has difficulty having a bowel movement, or has stools (feces) that are dry, hard, or larger than normal. ?Eat foods that have a lot of fiber, such as beans, whole grains, and fresh fruits and vegetables. ?Drink enough fluid to keep your urine pale yellow. ?Take over-the-counter and prescription medicines only as told by your health care provider. This includes any fiber supplements. ?This information is not intended to replace advice given to you by your health care provider. Make sure you discuss any questions you have with your health care provider. ?Document Revised: 10/02/2019 Document Reviewed: 10/02/2019 ?Elsevier Patient Education ? [place]. ? ? ?Sinusitis, Adult ?Sinusitis is inflammation of your sinuses. Sinuses are hollow spaces in the bones around your face. Your sinuses are located: ?Around your eyes. ?In the middle of your forehead. ?Behind your nose. ?In your cheekbones. ?Mucus normally drains out of your sinuses. When your nasal tissues become inflamed or swollen, mucus can become trapped or blocked. This allows bacteria, viruses, and fungi to grow, which leads to infection.  Most infections of the sinuses are caused by a virus. ?Sinusitis can develop quickly. It can last for up to 4 weeks (acute) or for more than 12 weeks (chronic). Sinusitis often develops after a cold. ?What are the causes? ?This condition is caused by anything that creates swelling in the sinuses or stops mucus from draining. This includes: ?Allergies. ?Asthma. ?Infection from bacteria or viruses. ?Deformities or blockages in your nose or sinuses. ?Abnormal growths in the nose (nasal polyps). ?Pollutants, such as chemicals or irritants in the air. ?Infection from fungi (rare). ?What increases the risk? ?You are more likely to develop this condition if you: ?Have a weak body defense system (immune system). ?Do a lot of swimming or diving. ?Overuse nasal sprays. ?Smoke. ?What are the signs or symptoms? ?The main symptoms of this condition are pain and a feeling of pressure around the affected sinuses. Other symptoms include: ?Stuffy nose or congestion. ?  Thick drainage from your nose. ?Swelling and warmth over the affected sinuses. ?Headache. ?Upper toothache. ?A cough that may get worse at night. ?Extra mucus that collects in the throat or the back of the nose (postnasal drip). ?Decreased sense of smell and taste. ?Fatigue. ?A fever. ?Sore throat. ?Bad breath. ?How is this diagnosed? ?This condition is diagnosed based on: ?Your symptoms. ?Your medical history. ?A physical exam. ?Tests to find out if your condition is acute or chronic. This may include: ?Checking your nose for nasal polyps. ?Viewing your sinuses using a device that has a light (endoscope). ?Testing for allergies or bacteria. ?Imaging tests, such as an MRI or CT scan. ?In rare cases, a bone biopsy may be done to rule out more serious types of fungal sinus disease. ?How is this treated? ?Treatment for sinusitis depends on the cause and whether your condition is chronic or acute. ?If caused by a virus, your symptoms should go away on their own within 10  days. You may be given medicines to relieve symptoms. They include: ?Medicines that shrink swollen nasal passages (topical intranasal decongestants). ?Medicines that treat allergies (antihistamines). ?A spray that ea

## 2022-02-24 NOTE — Progress Notes (Signed)
?Virtual Visit Consent  ? ?Joyce Decker, you are scheduled for a virtual visit with a [facility] provider today.   ?  ?Just as with appointments in the office, your consent must be obtained to participate.  Your consent will be active for this visit and any virtual visit you may have with one of our providers in the next 365 days.   ?  ?If you have a MyChart account, a copy of this consent can be sent to you electronically.  All virtual visits are billed to your insurance company just like a traditional visit in the office.   ? ?As this is a virtual visit, video technology does not allow for your provider to perform a traditional examination.  This may limit your provider's ability to fully assess your condition.  If your provider identifies any concerns that need to be evaluated in person or the need to arrange testing (such as labs, EKG, etc.), we will make arrangements to do so.   ?  ?Although advances in technology are sophisticated, we cannot ensure that it will always work on either your end or our end.  If the connection with a video visit is poor, the visit may have to be switched to a telephone visit.  With either a video or telephone visit, we are not always able to ensure that we have a secure connection.    ? ?I need to obtain your verbal consent now.   Are you willing to proceed with your visit today?  ?  ?Joyce Decker has provided verbal consent on 02/24/2022 for a virtual visit (video or telephone). ?  ?Mar Daring, PA-C  ? ?Date: 02/24/2022 4:30 PM ? ? ?Virtual Visit via Video Note  ? ?Reynolds Bowl, connected with  Joyce Decker  ([id number], 1956/08/31) on 02/24/22 at  4:00 PM EDT by a video-enabled telemedicine application and verified that I am speaking with the correct person using two identifiers. ? ?Location: ?Patient: work; isolated ?Provider: Virtual Visit Location Provider: Home Office ?  ?I discussed the limitations of evaluation and management by telemedicine and the  availability of in person appointments. The patient expressed understanding and agreed to proceed.   ? ?History of Present Illness: ?Joyce Decker is a 66 y.o. who identifies as a female who was assigned female at birth, and is being seen today for possible sinus infection. ? ?HPI: Sinusitis ?This is a new problem. The current episode started in the past 7 days. The problem has been gradually worsening since onset. There has been no fever. Associated symptoms include congestion, coughing, headaches, sinus pressure and a sore throat (morning). Pertinent negatives include no chills, ear pain or hoarse voice. Past treatments include nothing. The treatment provided no relief.  ?Constipation ?This is a recurrent problem. The current episode started 1 to 4 weeks ago. The problem has been gradually worsening since onset. Her stool frequency is 1 time per week or less. The stool is described as firm and pellet like. The patient is on a high fiber diet. She Exercises regularly. There has Been adequate water intake. Associated symptoms include abdominal pain and bloating. Pertinent negatives include no anorexia, fecal incontinence, fever, flatus, hematochezia, melena, nausea or vomiting. Risk factors include change in medication usage/dosage (recently restarted Contrave). She has tried diet changes and fiber for the symptoms. The treatment provided no relief.   ? ? ?Problems:  ?Patient Active Problem List  ? Diagnosis Date Noted  ? 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 History of migraine headaches 04/09/2015  ? HLD (hyperlipidemia) 04/09/2015  ? Chronic interstitial cystitis 04/09/2015  ? H/O ovarian cancer 06/08/2012  ? H/O renal calculi 06/08/2012  ? Urethral cyst 06/08/2012  ? 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 Awareness of heartbeats 02/19/2010  ? Apnea, sleep 02/19/2010  ? Airway hyperreactivity 05/06/2008  ? Family history of diabetes  mellitus 05/06/2008  ? Fam hx-ischem heart disease 05/06/2008  ?  ?Allergies:  ?Allergies  ?Allergen Reactions  ? Other Other (See Comments)  ?  Wheat barley and Grain unknown allergy, possible cause headache.  ? Tape Other (See Comments)  ?  Blisters  ? Gadobenate Nausea Only  ?  PT became nauseous with contrast, but did not vomit.    ? Iodinated Contrast Media Nausea Only  ? ?Medications:  ?Current Outpatient Medications:  ?  doxycycline (VIBRA-TABS) 100 MG tablet, Take 1 tablet (100 mg total) by mouth 2 (two) times daily., Disp: 20 tablet, Rfl: 0 ?  ipratropium (ATROVENT) 0.03 % nasal spray, Place 2 sprays into both nostrils every 12 (twelve) hours., Disp: 30 mL, Rfl: 0 ?  BIOTIN PO, Take 1 tablet by mouth daily. , Disp: , Rfl:  ?  calcium-vitamin D (OSCAL WITH D) 500-200 MG-UNIT per tablet, Take 1 tablet by mouth., Disp: , Rfl:  ?  Cinnamon 500 MG capsule, Take 500 mg by mouth daily., Disp: , Rfl:  ?  CRANBERRY PO, Take 1 capsule by mouth daily., Disp: , Rfl:  ?  gabapentin (NEURONTIN) 100 MG capsule, Take 1 capsule (100 mg total) by mouth 3 (three) times daily., Disp: 90 capsule, Rfl: 3 ?  Melatonin 3 MG TABS, Take 1 tablet by mouth at bedtime., Disp: , Rfl:  ?  meloxicam (MOBIC) 15 MG tablet, TAKE 1 TABLET BY MOUTH EVERY DAY, Disp: 30 tablet, Rfl: 1 ?  Multiple Vitamin (MULTIVITAMIN) capsule, Take 1 capsule by mouth daily., Disp: , Rfl:  ?  Naltrexone-buPROPion HCl ER 8-90 MG TB12, Start 1 tablet every morning for 7 days, then 1 tablet twice daily for 7 days, then 2 tablets every morning and one in the evening, Disp: 120 tablet, Rfl: 1 ?  terbinafine (LAMISIL) 250 MG tablet, TAKE 1 TABLET BY MOUTH EVERY DAY, Disp: 90 tablet, Rfl: 0 ?  tretinoin (RETIN-A) 0.025 % cream, Apply topically at bedtime. Qhs to face, Disp: 45 g, Rfl: 6 ?  valACYclovir (VALTREX) 1000 MG tablet, Take 2 tablets a first sign of flare repeat in 12 hours, Disp: 30 tablet, Rfl: 0 ? ?Observations/Objective: ?Patient is well-developed,  well-nourished in no acute distress.  ?Resting comfortably at work ?Head is normocephalic, atraumatic.  ?No labored breathing.  ?Speech is clear and coherent with logical content.  ?Patient is alert and oriented at baseline.  ? ? ?Assessment and Plan: ?1. Acute bacterial sinusitis ?- doxycycline (VIBRA-TABS) 100 MG tablet; Take 1 tablet (100 mg total) by mouth 2 (two) times daily.  Dispense: 20 tablet; Refill: 0 ?- ipratropium (ATROVENT) 0.03 % nasal spray; Place 2 sprays into both nostrils every 12 (twelve) hours.  Dispense: 30 mL; Refill: 0 ? ?2. Drug-induced constipation ? ?- Worsening sinus symptoms that have not responded to OTC medications.  ?- Will give Doxycycline ?- Continue allergy medications.  ?- Steam and humidifier can help ?- Discussed Miralax OTC daily for constipation ?- Stay well hydrated and get plenty of rest.  ?- Seek in person evaluation if no symptom improvement or  if symptoms worsen. ? ? ?Follow Up Instructions: ?I discussed the assessment and treatment plan with the patient. The patient was provided an opportunity to ask questions and all were answered. The patient agreed with the plan and demonstrated an understanding of the instructions.  A copy of instructions were sent to the patient via MyChart unless otherwise noted below.  ? ? ?The patient was advised to call back or seek an in-person evaluation if the symptoms worsen or if the condition fails to improve as anticipated. ? ?Time:  ?I spent 13 minutes with the patient via telehealth technology discussing the above problems/concerns.   ? ?Mar Daring, PA-C ?

## 2022-03-07 ENCOUNTER — Telehealth: Payer: Self-pay | Admitting: *Deleted

## 2022-03-07 NOTE — Telephone Encounter (Signed)
Dr. Amalia Hailey patient - ? ?Treating for toenail fungus - terbinafine treatment failed, alternative? Please advise. ?

## 2022-03-08 ENCOUNTER — Ambulatory Visit: Payer: Self-pay | Admitting: Dermatology

## 2022-03-08 ENCOUNTER — Ambulatory Visit (INDEPENDENT_AMBULATORY_CARE_PROVIDER_SITE_OTHER): Payer: Self-pay | Admitting: Dermatology

## 2022-03-08 DIAGNOSIS — I8393 Asymptomatic varicose veins of bilateral lower extremities: Secondary | ICD-10-CM

## 2022-03-08 NOTE — Progress Notes (Signed)
? ?  Follow-Up Visit ?  ?Subjective  ?Joyce Decker is a 66 y.o. female who presents for the following: Varicose Veins (Pt here for sclerotherapy ). ? ?The following portions of the chart were reviewed this encounter and updated as appropriate:  ? Tobacco  Allergies  Meds  Problems  Med Hx  Surg Hx  Fam Hx   ?  ?Review of Systems:  No other skin or systemic complaints except as noted in HPI or Assessment and Plan. ? ?Objective  ?Well appearing patient in no apparent distress; mood and affect are within normal limits. ? ?A focused examination was performed including bilateral legs. Relevant physical exam findings are noted in the Assessment and Plan. ? ?bilateral legs ? ? ? ? ? ? ? ? ? ? ? ? ? ? ? ? ? 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 ?Sclerotherapy - bilateral legs ?The patient presents for desired sclerotherapy for desired treatment of desired treatment of small to medium blue and red small to medium varicosities of the bilateral legs. ? ?Procedure: The patient was counseled and understands about the effects, side effects and potential risks and complications of the sclerotherapy procedure. The patient was given the opportunity to ask questions. Asclera (polidocanol) 1% (total 4 cc) was injected into the varices. In order to ensure correct placement of the catheter in the vein, I drew back slightly to give moderate blood show in the syringe. If there was any evidence or suspicion of extravasation of sclerosant, the area was immediately diluted with a large volume of 0.9% saline. A pressure dressing was applied immediately to the injected sites. The patient tolerated the procedure well without complication. The patient was instructed in post-operative compression stocking use. The patient understands to call or return immediately if any problems noted. ? ?Return in about 3 months (around 06/07/2022) for Follow up. ? ?I, Ashok Cordia, CMA, am acting as scribe for Sarina Ser, MD . ?Documentation: I have reviewed the above documentation for accuracy and completeness, and I agree with the above. ? ?Sarina Ser, MD ? ?

## 2022-03-08 NOTE — Patient Instructions (Signed)

## 2022-03-09 ENCOUNTER — Ambulatory Visit: Payer: Self-pay | Admitting: Dermatology

## 2022-03-09 ENCOUNTER — Telehealth: Payer: Self-pay | Admitting: *Deleted

## 2022-03-09 NOTE — Telephone Encounter (Signed)
"  I sent a message to Dr. Amalia Hailey earlier this week and he still hasn't responded."  Dr. Amalia Hailey is out of the office this week.  "I have been treated by Dr. Amalia Hailey in the past for fungal toenails.  I've tried topicals and I'm taken the medicine for three months.  Nothing seems to help, they seem to be getting worse.  My nails are turning black.  So, I'm wondering since I've tried all these treatments that maybe my insurance will cover laser.  I also read that there is a new liquid oral medication that can be used for this now."  Your insurance will not cover the laser procedure regardless of how many other treatments you have done.  Also, that medication is not something that the doctors prescribe at this time.  "Well, can I come in to see someone else this week to discuss other options or find out what is next?"  We do not have any time available this week.  "Why not, I really need to see someone this week.  I am out on break from school this week and school starts back next week.  It's hard for me to find time during the school days to take time off."  Our schedules are full and we've had doctors out of the office.  "Well, can you send Dr. Amalia Hailey a message and ask him what the next steps is or his nurse?"  I will send them a message. ?

## 2022-03-14 ENCOUNTER — Encounter: Payer: Self-pay | Admitting: Dermatology

## 2022-03-16 ENCOUNTER — Other Ambulatory Visit: Payer: BC Managed Care – PPO

## 2022-03-17 ENCOUNTER — Ambulatory Visit: Payer: BC Managed Care – PPO | Admitting: Oncology

## 2022-03-18 ENCOUNTER — Ambulatory Visit: Payer: BC Managed Care – PPO | Admitting: Oncology

## 2022-03-18 ENCOUNTER — Other Ambulatory Visit: Payer: Self-pay | Admitting: Physician Assistant

## 2022-03-18 DIAGNOSIS — B9689 Other specified bacterial agents as the cause of diseases classified elsewhere: Secondary | ICD-10-CM

## 2022-03-21 NOTE — Telephone Encounter (Signed)
LOV: 06/11/2021 ?LR:02/24/2022 ?NGF:REVQ ?

## 2022-04-04 ENCOUNTER — Telehealth: Payer: Self-pay

## 2022-04-04 NOTE — Telephone Encounter (Signed)
Called patient and advised that she needs an office visit with one of our providers. Patient is going to call back to schedule appointment after she looks at her calendar. ?

## 2022-04-04 NOTE — Telephone Encounter (Signed)
Copied from [facility] [id number]. Topic: Referral - Request for Referral ?>> Apr 04, 2022  4:40 PM Erick Blinks wrote: ?Has patient seen PCP for this complaint? No. ?*If NO, is insurance requiring patient see PCP for this issue before PCP can refer them? ?Referral for which specialty: Dentistry  ?Preferred provider/office: [facility] - Dr. Roselyn Reef  ?Reason for referral: Needs dental procedure and needs her sleep study results sent to her and dentist. ?

## 2022-05-09 ENCOUNTER — Other Ambulatory Visit: Payer: Self-pay

## 2022-05-09 DIAGNOSIS — Z8543 Personal history of malignant neoplasm of ovary: Secondary | ICD-10-CM

## 2022-05-10 ENCOUNTER — Inpatient Hospital Stay: Payer: BC Managed Care – PPO | Attending: Oncology

## 2022-05-10 ENCOUNTER — Other Ambulatory Visit: Payer: Self-pay

## 2022-05-10 DIAGNOSIS — Z1501 Genetic susceptibility to malignant neoplasm of breast: Secondary | ICD-10-CM | POA: Diagnosis not present

## 2022-05-10 DIAGNOSIS — Z1509 Genetic susceptibility to other malignant neoplasm: Secondary | ICD-10-CM | POA: Diagnosis not present

## 2022-05-10 DIAGNOSIS — E039 Hypothyroidism, unspecified: Secondary | ICD-10-CM | POA: Insufficient documentation

## 2022-05-10 DIAGNOSIS — D751 Secondary polycythemia: Secondary | ICD-10-CM | POA: Insufficient documentation

## 2022-05-10 DIAGNOSIS — Z8543 Personal history of malignant neoplasm of ovary: Secondary | ICD-10-CM

## 2022-05-10 DIAGNOSIS — Z1502 Genetic susceptibility to malignant neoplasm of ovary: Secondary | ICD-10-CM | POA: Diagnosis not present

## 2022-05-10 DIAGNOSIS — G4730 Sleep apnea, unspecified: Secondary | ICD-10-CM | POA: Diagnosis not present

## 2022-05-10 LAB — CBC WITH DIFFERENTIAL/PLATELET
Abs Immature Granulocytes: 0.03 10*3/uL (ref 0.00–0.07)
Basophils Absolute: 0.1 10*3/uL (ref 0.0–0.1)
Basophils Relative: 1 %
Eosinophils Absolute: 0.2 10*3/uL (ref 0.0–0.5)
Eosinophils Relative: 2 %
HCT: 46.3 % — ABNORMAL HIGH (ref 36.0–46.0)
Hemoglobin: 15.7 g/dL — ABNORMAL HIGH (ref 12.0–15.0)
Immature Granulocytes: 0 %
Lymphocytes Relative: 32 %
Lymphs Abs: 2.7 10*3/uL (ref 0.7–4.0)
MCH: 29.4 pg (ref 26.0–34.0)
MCHC: 33.9 g/dL (ref 30.0–36.0)
MCV: 86.7 fL (ref 80.0–100.0)
Monocytes Absolute: 0.8 10*3/uL (ref 0.1–1.0)
Monocytes Relative: 9 %
Neutro Abs: 4.7 10*3/uL (ref 1.7–7.7)
Neutrophils Relative %: 56 %
Platelets: 256 10*3/uL (ref 150–400)
RBC: 5.34 MIL/uL — ABNORMAL HIGH (ref 3.87–5.11)
RDW: 13.5 % (ref 11.5–15.5)
WBC: 8.5 10*3/uL (ref 4.0–10.5)
nRBC: 0 % (ref 0.0–0.2)

## 2022-05-10 LAB — COMPREHENSIVE METABOLIC PANEL
ALT: 27 U/L (ref 0–44)
AST: 24 U/L (ref 15–41)
Albumin: 4.3 g/dL (ref 3.5–5.0)
Alkaline Phosphatase: 69 U/L (ref 38–126)
Anion gap: 10 (ref 5–15)
BUN: 18 mg/dL (ref 8–23)
CO2: 27 mmol/L (ref 22–32)
Calcium: 9.3 mg/dL (ref 8.9–10.3)
Chloride: 98 mmol/L (ref 98–111)
Creatinine, Ser: 0.78 mg/dL (ref 0.44–1.00)
GFR, Estimated: >60 mL/min (ref >60)
Glucose, Bld: 104 mg/dL — ABNORMAL HIGH (ref 70–99)
Potassium: 3.5 mmol/L (ref 3.5–5.1)
Sodium: 135 mmol/L (ref 135–145)
Total Bilirubin: 0.7 mg/dL (ref 0.3–1.2)
Total Protein: 7.8 g/dL (ref 6.5–8.1)

## 2022-05-11 LAB — CA 125: Cancer Antigen (CA) 125: 8.1 U/mL (ref 0.0–38.1)

## 2022-05-12 ENCOUNTER — Inpatient Hospital Stay: Payer: BC Managed Care – PPO

## 2022-05-12 ENCOUNTER — Telehealth: Payer: Self-pay | Admitting: Oncology

## 2022-05-12 ENCOUNTER — Inpatient Hospital Stay (HOSPITAL_BASED_OUTPATIENT_CLINIC_OR_DEPARTMENT_OTHER): Payer: BC Managed Care – PPO | Admitting: Oncology

## 2022-05-12 ENCOUNTER — Other Ambulatory Visit: Payer: Self-pay

## 2022-05-12 ENCOUNTER — Encounter: Payer: Self-pay | Admitting: Oncology

## 2022-05-12 VITALS — BP 175/67 | HR 97 | Temp 98.4°F | Resp 18 | Wt 172.0 lb

## 2022-05-12 DIAGNOSIS — D751 Secondary polycythemia: Secondary | ICD-10-CM

## 2022-05-12 DIAGNOSIS — Z8543 Personal history of malignant neoplasm of ovary: Secondary | ICD-10-CM | POA: Insufficient documentation

## 2022-05-12 DIAGNOSIS — Z1501 Genetic susceptibility to malignant neoplasm of breast: Secondary | ICD-10-CM

## 2022-05-12 DIAGNOSIS — Z1509 Genetic susceptibility to other malignant neoplasm: Secondary | ICD-10-CM

## 2022-05-12 DIAGNOSIS — Z1502 Genetic susceptibility to malignant neoplasm of ovary: Secondary | ICD-10-CM

## 2022-05-12 DIAGNOSIS — Z803 Family history of malignant neoplasm of breast: Secondary | ICD-10-CM

## 2022-05-12 LAB — CBC WITH DIFFERENTIAL/PLATELET
Abs Immature Granulocytes: 0.03 10*3/uL (ref 0.00–0.07)
Basophils Absolute: 0.1 10*3/uL (ref 0.0–0.1)
Basophils Relative: 1 %
Eosinophils Absolute: 0.1 10*3/uL (ref 0.0–0.5)
Eosinophils Relative: 2 %
HCT: 45.3 % (ref 36.0–46.0)
Hemoglobin: 15.4 g/dL — ABNORMAL HIGH (ref 12.0–15.0)
Immature Granulocytes: 0 %
Lymphocytes Relative: 32 %
Lymphs Abs: 2.5 10*3/uL (ref 0.7–4.0)
MCH: 29.3 pg (ref 26.0–34.0)
MCHC: 34 g/dL (ref 30.0–36.0)
MCV: 86.1 fL (ref 80.0–100.0)
Monocytes Absolute: 0.7 10*3/uL (ref 0.1–1.0)
Monocytes Relative: 10 %
Neutro Abs: 4.3 10*3/uL (ref 1.7–7.7)
Neutrophils Relative %: 55 %
Platelets: 246 10*3/uL (ref 150–400)
RBC: 5.26 MIL/uL — ABNORMAL HIGH (ref 3.87–5.11)
RDW: 13.4 % (ref 11.5–15.5)
WBC: 7.7 10*3/uL (ref 4.0–10.5)
nRBC: 0 % (ref 0.0–0.2)

## 2022-05-12 LAB — COMPREHENSIVE METABOLIC PANEL
ALT: 29 U/L (ref 0–44)
AST: 23 U/L (ref 15–41)
Albumin: 4.3 g/dL (ref 3.5–5.0)
Alkaline Phosphatase: 68 U/L (ref 38–126)
Anion gap: 7 (ref 5–15)
BUN: 21 mg/dL (ref 8–23)
CO2: 26 mmol/L (ref 22–32)
Calcium: 9.2 mg/dL (ref 8.9–10.3)
Chloride: 105 mmol/L (ref 98–111)
Creatinine, Ser: 0.96 mg/dL (ref 0.44–1.00)
GFR, Estimated: >60 mL/min (ref >60)
Glucose, Bld: 97 mg/dL (ref 70–99)
Potassium: 3.6 mmol/L (ref 3.5–5.1)
Sodium: 138 mmol/L (ref 135–145)
Total Bilirubin: 0.8 mg/dL (ref 0.3–1.2)
Total Protein: 7.7 g/dL (ref 6.5–8.1)

## 2022-05-12 NOTE — Assessment & Plan Note (Addendum)
Recommend bilateral diagnostic mammogram annually alternate with breast MRI bilaterally annually. Obtain bilateral screen mammogram asap- she is overdue.   breast MRI bilaterally in August 2023- patient's preference.  No family history of pancreatic caner- screen MRI abdomen not approved by insurance.

## 2022-05-12 NOTE — Telephone Encounter (Signed)
Spoke with [facility] to schedule mamm, Office states that once mamm is complete, MRI can be scheduled .KJ

## 2022-05-12 NOTE — Assessment & Plan Note (Signed)
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  Continue surveillance.

## 2022-05-12 NOTE — Progress Notes (Signed)
Hematology/Oncology Progress note [phone] [phone]      Patient Care Team: Rubye Beach as PCP - General (Family Medicine) Clent Jacks, RN as Oncology Nurse Navigator ASSESSMENT & PLAN:   BRCA1 gene mutation positive in female Recommend bilateral diagnostic mammogram annually alternate with breast MRI bilaterally annually. Obtain bilateral screen mammogram asap- she is overdue.   breast MRI bilaterally in August 2023- patient's preference.  No family history of pancreatic caner- screen MRI abdomen not approved by insurance.  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  Continue surveillance.   Erythrocytosis Likely due to sleep apnea.  Check erythropoietin, Jak 2 mutation w reflex, bcr abl1 No need for phlebotomy  Orders Placed This Encounter  Procedures   MM 3D SCREEN BREAST BILATERAL    Standing Status:   Future    Standing Expiration Date:   05/13/2023    Order Specific Question:   Reason for Exam (SYMPTOM  OR DIAGNOSIS REQUIRED)    Answer:   BRCA1    Order Specific Question:   Preferred imaging location?    Answer:   [facility]   MR BREAST BILATERAL W WO CONTRAST INC CAD    Standing Status:   Future    Standing Expiration Date:   11/11/2022    Order Specific Question:   If indicated for the ordered procedure, I authorize the administration of contrast media per Radiology protocol    Answer:   Yes    Order Specific Question:   What is the patient's sedation requirement?    Answer:   No Sedation    Order Specific Question:   Does the patient have a pacemaker or implanted devices?    Answer:   No    Order Specific Question:   Radiology Contrast Protocol - do NOT remove file path    Answer:   [email or web address]    Order Specific Question:   Preferred imaging location?    Answer:   [facility] (table limit - 550lbs)   CBC with  Differential/Platelet    Standing Status:   Future    Number of Occurrences:   1    Standing Expiration Date:   05/13/2023   Comprehensive metabolic panel    Standing Status:   Future    Number of Occurrences:   1    Standing Expiration Date:   05/13/2023   CA 125    Standing Status:   Future    Number of Occurrences:   1    Standing Expiration Date:   05/13/2023   BCR-ABL1 FISH    Standing Status:   Future    Number of Occurrences:   1    Standing Expiration Date:   05/13/2023   Carbon monoxide, blood (performed at ref lab)    Standing Status:   Future    Number of Occurrences:   1    Standing Expiration Date:   05/13/2023   Erythropoietin    Standing Status:   Future    Number of Occurrences:   1    Standing Expiration Date:   05/13/2023   Miscellaneous [facility] test (send-out)    Standing Status:   Future    Number of Occurrences:   1    Standing Expiration Date:   05/13/2023    Order Specific Question:   Test name / description:    Answer:   JAk2 V617F with reflex to EX 12-15/CARL/MPL  [id number]   JAK2  V617F rfx CALR/MPL/E12-15    Standing Status:   Future    Standing Expiration Date:   05/13/2023   Follow up in 1 year. All questions were answered. The patient knows to call the clinic with any problems, questions or concerns.  Earlie Server, MD, PhD [facility] Hematology Oncology 05/12/2022    CHIEF COMPLAINTS/REASON FOR VISIT:  Follow up for high risk for breast cancer, BRCA-1 mutation.   HISTORY OF PRESENTING ILLNESS:   Joyce Decker is a  66 y.o.  female with PMH listed below was seen in consultation at the request of  Mar Daring, P*  for evaluation of high risk for breast cancer, BRCA mutation.   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  patient underwent surgery, postoperatively received adjuvant therapy with  cisplatin and paclitaxel, intraperitoneal and IV therapy.  Patient has been in complete remission since then. Patient lost follow-up for a few years and recently reestablished care with Dr. Theora Gianotti.  Per patient, patient was tested positive for BRCA1 mutation and she was sent to establish care for high risk breast cancer clinic. Last mammogram was done in August 2017.  Patient has no complaints today.  Patient denies any breast concerns.  Patient reports that one of her son was also tested positive for BRCA1 mutation.  Her younger sons have not been tested yet.  BRCA1 mutation report was scanned to EMR.  INTERVAL HISTORY Joyce Decker is a 66 y.o. female who has above history reviewed by me today presents for follow up visit for management of history of ovarian cancer, BRCA1 mutation. MRI breast was recommended and not done.  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  No new complains.  She uses CPAP machine every day.   Review of Systems  Constitutional:  Negative for appetite change, chills, fatigue and fever.  HENT:   Negative for hearing loss and voice change.   Eyes:  Negative for eye problems.  Respiratory:  Negative for chest tightness and cough.   Cardiovascular:  Negative for chest pain.  Gastrointestinal:  Negative for abdominal distention, abdominal pain and blood in stool.  Endocrine: Negative for hot flashes.  Genitourinary:  Negative for difficulty urinating and frequency.   Musculoskeletal:  Negative for arthralgias.  Skin:  Negative for itching and rash.  Neurological:  Negative for extremity weakness.  Hematological:  Negative for adenopathy.  Psychiatric/Behavioral:  Negative for confusion.     MEDICAL HISTORY:  Past Medical History:  Diagnosis Date   Asthma    BRCA1 gene mutation positive    Cancer ([place]) 2006   ovarian cancer 2006   Gestational diabetes    Hyperthyroidism    Hypothyroidism     Kidney stones    Personal history of chemotherapy 2006   ovarian ca   Sleep apnea    CPAP    SURGICAL HISTORY: Past Surgical History:  Procedure Laterality Date   ABDOMINAL HYSTERECTOMY Bilateral    AND BSO   COLONOSCOPY WITH PROPOFOL N/A 07/03/2015   Procedure: COLONOSCOPY WITH PROPOFOL;  Surgeon: Josefine Class, MD;  Location: [facility] ENDOSCOPY;  Service: Endoscopy;  Laterality: N/A;   COLONOSCOPY WITH PROPOFOL N/A 10/20/2021   Procedure: COLONOSCOPY WITH PROPOFOL;  Surgeon: Toledo, Benay Pike, MD;  Location: [facility] ENDOSCOPY;  Service: Gastroenterology;  Laterality: N/A;   Dental Implant  ESOPHAGOGASTRODUODENOSCOPY N/A 10/20/2021   Procedure: ESOPHAGOGASTRODUODENOSCOPY (EGD);  Surgeon: Toledo, Benay Pike, MD;  Location: [facility] ENDOSCOPY;  Service: Gastroenterology;  Laterality: N/A;   OOPHORECTOMY     TONSILLECTOMY AND ADENOIDECTOMY      SOCIAL HISTORY: Social History   Socioeconomic History   Marital status: Married    Spouse name: Not on file   Number of children: Not on file   Years of education: Not on file   Highest education level: Not on file  Occupational History   Not on file  Tobacco Use   Smoking status: Former   Smokeless tobacco: Never   Tobacco comments:    35 years ago  Vaping Use   Vaping Use: Never used  Substance and Sexual Activity   Alcohol use: No   Drug use: No   Sexual activity: Yes  Other Topics Concern   Not on file  Social History Narrative   Not on file   Social Determinants of Health   Financial Resource Strain: Not on file  Food Insecurity: Not on file  Transportation Needs: Not on file  Physical Activity: Not on file  Stress: Not on file  Social Connections: Not on file  Intimate Partner Violence: Not on file    FAMILY HISTORY: Family History  Problem Relation Age of Onset   Diabetes Mother    Hypertension Mother    Diabetes Maternal Aunt    Heart disease Maternal Aunt    Hypertension Maternal Aunt    Diabetes Maternal  Uncle    Heart disease Maternal Uncle    Hypertension Maternal Uncle    Colon cancer Maternal Uncle    Brain cancer Maternal Grandmother    Diabetes Cousin    Breast cancer Cousin    Melanoma Other    Stomach cancer Other    Prostate cancer Maternal Uncle    Prostate cancer Maternal Uncle    Breast cancer Cousin    Breast cancer Cousin    Breast cancer Cousin    BRCA 1/2 Son    Breast cancer Cousin    Breast cancer Cousin     ALLERGIES:  is allergic to other, tape, gadobenate, and iodinated contrast media.  MEDICATIONS:  Current Outpatient Medications  Medication Sig Dispense Refill   BIOTIN PO Take 1 tablet by mouth daily.      calcium-vitamin D (OSCAL WITH D) 500-200 MG-UNIT per tablet Take 1 tablet by mouth.     Cinnamon 500 MG capsule Take 500 mg by mouth daily.     CRANBERRY PO Take 1 capsule by mouth daily.     gabapentin (NEURONTIN) 100 MG capsule Take 1 capsule (100 mg total) by mouth 3 (three) times daily. 90 capsule 3   ipratropium (ATROVENT) 0.03 % nasal spray Place 2 sprays into both nostrils every 12 (twelve) hours. 30 mL 0   Melatonin 3 MG TABS Take 1 tablet by mouth at bedtime.     meloxicam (MOBIC) 15 MG tablet TAKE 1 TABLET BY MOUTH EVERY DAY 30 tablet 1   Multiple Vitamin (MULTIVITAMIN) capsule Take 1 capsule by mouth daily.     terbinafine (LAMISIL) 250 MG tablet TAKE 1 TABLET BY MOUTH EVERY DAY 90 tablet 0   tretinoin (RETIN-A) 0.025 % cream Apply topically at bedtime. Qhs to face 45 g 6   valACYclovir (VALTREX) 1000 MG tablet Take 2 tablets a first sign of flare repeat in 12 hours 30 tablet 0   Naltrexone-buPROPion HCl ER 8-90 MG TB12 Start 1 tablet every  morning for 7 days, then 1 tablet twice daily for 7 days, then 2 tablets every morning and one in the evening (Patient not taking: Reported on 05/12/2022) 120 tablet 1   No current facility-administered medications for this visit.     PHYSICAL EXAMINATION: ECOG PERFORMANCE STATUS: 0 -  Asymptomatic Vitals:   05/12/22 1500  BP: (!) 175/67  Pulse: 97  Resp: 18  Temp: 98.4 F (36.9 C)   Filed Weights   05/12/22 1500  Weight: 172 lb (78 kg)    Physical Exam Constitutional:      General: She is not in acute distress. HENT:     Head: Normocephalic and atraumatic.  Eyes:     General: No scleral icterus. Cardiovascular:     Rate and Rhythm: Normal rate and regular rhythm.     Heart sounds: Normal heart sounds.  Pulmonary:     Effort: Pulmonary effort is normal. No respiratory distress.     Breath sounds: No wheezing.  Abdominal:     General: Bowel sounds are normal. There is no distension.     Palpations: Abdomen is soft.  Musculoskeletal:        General: No deformity. Normal range of motion.     Cervical back: Normal range of motion and neck supple.  Skin:    General: Skin is warm and dry.     Findings: No erythema or rash.  Neurological:     Mental Status: She is alert and oriented to person, place, and time. Mental status is at baseline.     Cranial Nerves: No cranial nerve deficit.     Coordination: Coordination normal.  Psychiatric:        Mood and Affect: Mood normal.     LABORATORY DATA:  I have reviewed the data as listed Lab Results  Component Value Date   WBC 7.7 05/12/2022   HGB 15.4 (H) 05/12/2022   HCT 45.3 05/12/2022   MCV 86.1 05/12/2022   PLT 246 05/12/2022   Recent Labs    05/10/22 1609 05/12/22 1605  NA 135 138  K 3.5 3.6  CL 98 105  CO2 27 26  GLUCOSE 104* 97  BUN 18 21  CREATININE 0.78 0.96  CALCIUM 9.3 9.2  GFRNONAA >60 >60  PROT 7.8 7.7  ALBUMIN 4.3 4.3  AST 24 23  ALT 27 29  ALKPHOS 69 68  BILITOT 0.7 0.8    Iron/TIBC/Ferritin/ %Sat No results found for: "IRON", "TIBC", "FERRITIN", "IRONPCTSAT"    RADIOGRAPHIC STUDIES: I have personally reviewed the radiological images as listed and agreed with the findings in the report.  No results found.

## 2022-05-12 NOTE — Progress Notes (Signed)
Pt here for follow up. Pt reports she has been diagnosed with osteoarthritis recently.

## 2022-05-12 NOTE — Assessment & Plan Note (Signed)
Likely due to sleep apnea.  Check erythropoietin, Jak 2 mutation w reflex, bcr abl1 No need for phlebotomy

## 2022-05-13 ENCOUNTER — Ambulatory Visit: Payer: BC Managed Care – PPO | Admitting: Family Medicine

## 2022-05-13 ENCOUNTER — Encounter: Payer: Self-pay | Admitting: Family Medicine

## 2022-05-13 VITALS — BP 136/78 | HR 68 | Temp 98.4°F | Resp 16 | Ht 65.0 in | Wt 172.3 lb

## 2022-05-13 DIAGNOSIS — M25551 Pain in right hip: Secondary | ICD-10-CM | POA: Diagnosis not present

## 2022-05-13 DIAGNOSIS — G4733 Obstructive sleep apnea (adult) (pediatric): Secondary | ICD-10-CM | POA: Diagnosis not present

## 2022-05-13 DIAGNOSIS — D582 Other hemoglobinopathies: Secondary | ICD-10-CM | POA: Diagnosis not present

## 2022-05-13 DIAGNOSIS — M19041 Primary osteoarthritis, right hand: Secondary | ICD-10-CM | POA: Diagnosis not present

## 2022-05-13 LAB — CARBON MONOXIDE, BLOOD (PERFORMED AT REF LAB): Carbon Monoxide, Blood: 2.2 % (ref 0.0–3.6)

## 2022-05-13 LAB — ERYTHROPOIETIN: Erythropoietin: 8.6 m[IU]/mL (ref 2.6–18.5)

## 2022-05-13 MED ORDER — DICLOFENAC SODIUM 1 % EX GEL: 4.0000 g | Freq: Four times a day (QID) | CUTANEOUS | 11 refills | Status: DC

## 2022-05-13 MED ORDER — METHOCARBAMOL 750 MG PO TABS: 750.0000 mg | ORAL_TABLET | Freq: Every day | ORAL | 2 refills | Status: DC

## 2022-05-13 NOTE — Assessment & Plan Note (Signed)
Acute, stable May be OA; pt does not wish to use additional NSAIDs at this time Plans to work on mobility and stretching Encourage weight loss to BMI <25 Recommend use of PRN qHS muscle relaxant to assist

## 2022-05-13 NOTE — Assessment & Plan Note (Signed)
Chronic, stable Encouraged patient to reach out to home health team in regards to if her machine is titration her pressure; initial sleep study >10 years ago however, pt got a new machine within the last 1-2 years Do not believe OSA is to blame for elevated HGT/HCT/RBC based on hx of elevation on/off for >5+ years

## 2022-05-13 NOTE — Progress Notes (Signed)
Established patient visit   Patient: Joyce Decker   DOB: [birth year]   66 y.o. Female  MRN: [id number] Visit Date: 05/13/2022  Today's healthcare provider: [name], FNP  Patient presents for new patient visit to establish care.  Introduced to Designer, jewellery role and practice setting.  All questions answered.  Discussed provider/patient relationship and expectations.   I,Tiffany J Bragg,acting as a scribe for [name], FNP.,have documented all relevant documentation on the behalf of [name], FNP,as directed by  [name], FNP while in the presence of [name], FNP.   No chief complaint on file.  Subjective    HPI HPI   Patient is here to go over her lab results, and follow up on her CPAP use.  Last edited by Smitty Knudsen, CMA on 05/13/2022  3:51 PM.       Medications: Outpatient Medications Prior to Visit  Medication Sig   BIOTIN PO Take 1 tablet by mouth daily.    calcium-vitamin D (OSCAL WITH D) 500-200 MG-UNIT per tablet Take 1 tablet by mouth.   Cinnamon 500 MG capsule Take 500 mg by mouth daily.   CRANBERRY PO Take 1 capsule by mouth daily.   Melatonin 3 MG TABS Take 1 tablet by mouth at bedtime.   Multiple Vitamin (MULTIVITAMIN) capsule Take 1 capsule by mouth daily.   pantoprazole (PROTONIX) 20 MG tablet Take 20 mg by mouth daily.   tretinoin (RETIN-A) 0.025 % cream Apply topically at bedtime. Qhs to face   valACYclovir (VALTREX) 1000 MG tablet Take 2 tablets a first sign of flare repeat in 12 hours   gabapentin (NEURONTIN) 100 MG capsule Take 1 capsule (100 mg total) by mouth 3 (three) times daily. (Patient not taking: Reported on 05/13/2022)   ipratropium (ATROVENT) 0.03 % nasal spray Place 2 sprays into both nostrils every 12 (twelve) hours. (Patient not taking: Reported on 05/13/2022)   meloxicam (MOBIC) 15 MG tablet TAKE 1 TABLET BY MOUTH EVERY DAY (Patient not taking: Reported on 05/13/2022)   Naltrexone-buPROPion HCl ER 8-90 MG  TB12 Start 1 tablet every morning for 7 days, then 1 tablet twice daily for 7 days, then 2 tablets every morning and one in the evening (Patient not taking: Reported on 05/13/2022)   terbinafine (LAMISIL) 250 MG tablet TAKE 1 TABLET BY MOUTH EVERY DAY (Patient not taking: Reported on 05/13/2022)   No facility-administered medications prior to visit.    Review of Systems     Objective    BP 136/78 (BP Location: Right Arm, Patient Position: Sitting, Cuff Size: Normal)   Pulse 68   Temp 98.4 F (36.9 C) (Oral)   Resp 16   Ht '5\' 5"'$  (1.651 m)   Wt 172 lb 4.8 oz (78.2 kg)   SpO2 100%   BMI 28.67 kg/m    Physical Exam Vitals and nursing note reviewed.  Constitutional:      General: She is not in acute distress.    Appearance: Normal appearance. She is overweight. She is not ill-appearing, toxic-appearing or diaphoretic.  HENT:     Head: Normocephalic and atraumatic.  Cardiovascular:     Rate and Rhythm: Normal rate and regular rhythm.     Pulses: Normal pulses.     Heart sounds: Normal heart sounds. No murmur heard.    No friction rub. No gallop.  Pulmonary:     Effort: Pulmonary effort is normal. No respiratory distress.  Breath sounds: Normal breath sounds. No stridor. No wheezing, rhonchi or rales.  Chest:     Chest wall: No tenderness.  Abdominal:     General: Bowel sounds are normal.     Palpations: Abdomen is soft.  Musculoskeletal:        General: Swelling and deformity present. No tenderness or signs of injury.     Right lower leg: No edema.     Left lower leg: No edema.     Comments: No tenderness over R hip bursa Sunken in anatomical snuff box on R hand  Skin:    General: Skin is warm and dry.     Capillary Refill: Capillary refill takes less than 2 seconds.     Coloration: Skin is not jaundiced or pale.     Findings: No bruising, erythema, lesion or rash.  Neurological:     General: No focal deficit present.     Mental Status: She is alert and oriented to  person, place, and time. Mental status is at baseline.     Cranial Nerves: No cranial nerve deficit.     Sensory: No sensory deficit.     Motor: No weakness.     Coordination: Coordination normal.  Psychiatric:        Mood and Affect: Mood normal.        Behavior: Behavior normal.        Thought Content: Thought content normal.        Judgment: Judgment normal.       No results found for any visits on 05/13/22.  Assessment & Plan     Problem List Items Addressed This Visit       Respiratory   Apnea, sleep    Chronic, stable Encouraged patient to reach out to home health team in regards to if her machine is titration her pressure; initial sleep study >10 years ago however, pt got a new machine within the last 1-2 years Do not believe OSA is to blame for elevated HGT/HCT/RBC based on hx of elevation on/off for >5+ years        Musculoskeletal and Integument   Arthritis of right hand    Chronic, worsening Hx of trigger finger, thumb Treated with injections; has not had pulley surgery Previously followed by ortho Recommend use of topical NSAID cream up to QID PRN      Relevant Medications   diclofenac Sodium (VOLTAREN ARTHRITIS PAIN) 1 % GEL   methocarbamol (ROBAXIN-750) 750 MG tablet     Other   Abnormal hemoglobin (HCC) - Primary    Chronic, variable Elevations seen in HGB, HCT and RBC for > 5 years Recommend additional f/u with hematology for gene testing Do not believe OSA is to blame for elevated HGT/HCT/RBC based on hx of elevation on/off for >5+ years      Relevant Orders   Ambulatory referral to Hematology / Oncology   Pain of right hip    Acute, stable May be OA; pt does not wish to use additional NSAIDs at this time Plans to work on mobility and stretching Encourage weight loss to BMI <25 Recommend use of PRN qHS muscle relaxant to assist       Relevant Medications   methocarbamol (ROBAXIN-750) 750 MG tablet     Return if symptoms worsen or fail  to improve.      Vonna Kotyk, FNP, have reviewed all documentation for this visit. The documentation on 05/13/22 for the exam, diagnosis, procedures, and orders are  all accurate and complete.    [name], [place] [phone] (phone) [phone] (fax)  [place]

## 2022-05-13 NOTE — Assessment & Plan Note (Signed)
Chronic, worsening Hx of trigger finger, thumb Treated with injections; has not had pulley surgery Previously followed by ortho Recommend use of topical NSAID cream up to QID PRN

## 2022-05-13 NOTE — Assessment & Plan Note (Signed)
Chronic, variable Elevations seen in HGB, HCT and RBC for > 5 years Recommend additional f/u with hematology for gene testing Do not believe OSA is to blame for elevated HGT/HCT/RBC based on hx of elevation on/off for >5+ years

## 2022-05-16 LAB — BCR-ABL1 FISH
Cells Analyzed: 200
Cells Counted: 200

## 2022-05-20 LAB — MISC LABCORP TEST (SEND OUT): Labcorp test code: 489555

## 2022-05-23 ENCOUNTER — Telehealth: Payer: Self-pay

## 2022-05-23 ENCOUNTER — Other Ambulatory Visit: Payer: Self-pay | Admitting: Family Medicine

## 2022-05-23 DIAGNOSIS — G4733 Obstructive sleep apnea (adult) (pediatric): Secondary | ICD-10-CM

## 2022-05-23 DIAGNOSIS — D582 Other hemoglobinopathies: Secondary | ICD-10-CM

## 2022-05-23 NOTE — Telephone Encounter (Signed)
Called and informed patient of lab results and Dr. Bethanne Ginger recommendation. Patient verbalized understanding. Patient states that the Dr. Who dx her with sleep apnea is no longer at the practice and is wanting to see if Dr. Cathie Hoops can refer her to pulmonology so they can help with her CPAP, making sure it is optimized correctly. Informed patient I will reach out to Dr. Cathie Hoops and we will get back to her. Patient verbalized understanding.

## 2022-05-25 NOTE — Telephone Encounter (Signed)
Referral has been placed. 

## 2022-06-07 ENCOUNTER — Ambulatory Visit
Admission: RE | Admit: 2022-06-07 | Discharge: 2022-06-07 | Disposition: A | Discharge status: Home / Self Care | Payer: BC Managed Care – PPO | Source: Ambulatory Visit | Attending: Oncology | Admitting: Oncology

## 2022-06-07 DIAGNOSIS — Z1501 Genetic susceptibility to malignant neoplasm of breast: Secondary | ICD-10-CM | POA: Diagnosis present

## 2022-06-07 DIAGNOSIS — Z1509 Genetic susceptibility to other malignant neoplasm: Secondary | ICD-10-CM | POA: Insufficient documentation

## 2022-06-07 DIAGNOSIS — Z1231 Encounter for screening mammogram for malignant neoplasm of breast: Secondary | ICD-10-CM | POA: Insufficient documentation

## 2022-06-09 NOTE — Telephone Encounter (Signed)
Per pt reminder note:   Joyce Decker, CMA  P Yu Tbd Schedule MRI week of 07/04/2022   Please schedule and inform pt of appt

## 2022-06-10 NOTE — Telephone Encounter (Signed)
Thanks. Mychart message with appt info sent as well.

## 2022-07-05 ENCOUNTER — Ambulatory Visit: Admission: RE | Admit: 2022-07-05 | Payer: BC Managed Care – PPO | Source: Ambulatory Visit

## 2022-07-10 ENCOUNTER — Other Ambulatory Visit: Payer: Self-pay | Admitting: Podiatry

## 2022-07-11 ENCOUNTER — Telehealth: Payer: Self-pay | Admitting: Family Medicine

## 2022-07-11 ENCOUNTER — Other Ambulatory Visit: Payer: Self-pay

## 2022-07-11 ENCOUNTER — Ambulatory Visit: Payer: Self-pay

## 2022-07-11 DIAGNOSIS — B9689 Other specified bacterial agents as the cause of diseases classified elsewhere: Secondary | ICD-10-CM

## 2022-07-11 MED ORDER — IPRATROPIUM BROMIDE 0.03 % NA SOLN: 2.0000 | Freq: Two times a day (BID) | NASAL | 0 refills | Status: DC

## 2022-07-11 NOTE — Progress Notes (Unsigned)
      Established patient visit   Patient: Joyce Decker   DOB: Jan 21, 1956   66 y.o. Female  MRN: [id number] Visit Date: 07/13/2022  Today's healthcare provider: [name], FNP   No chief complaint on file.  Subjective    HPI  ***  Medications: Outpatient Medications Prior to Visit  Medication Sig   BIOTIN PO Take 1 tablet by mouth daily.    calcium-vitamin D (OSCAL WITH D) 500-200 MG-UNIT per tablet Take 1 tablet by mouth.   Cinnamon 500 MG capsule Take 500 mg by mouth daily.   CRANBERRY PO Take 1 capsule by mouth daily.   diclofenac Sodium (VOLTAREN ARTHRITIS PAIN) 1 % GEL Apply 4 g topically 4 (four) times daily.   gabapentin (NEURONTIN) 100 MG capsule Take 1 capsule (100 mg total) by mouth 3 (three) times daily. (Patient not taking: Reported on 05/13/2022)   ipratropium (ATROVENT) 0.03 % nasal spray Place 2 sprays into both nostrils every 12 (twelve) hours.   Melatonin 3 MG TABS Take 1 tablet by mouth at bedtime.   meloxicam (MOBIC) 15 MG tablet TAKE 1 TABLET BY MOUTH EVERY DAY (Patient not taking: Reported on 05/13/2022)   methocarbamol (ROBAXIN-750) 750 MG tablet Take 1 tablet (750 mg total) by mouth at bedtime.   Multiple Vitamin (MULTIVITAMIN) capsule Take 1 capsule by mouth daily.   Naltrexone-buPROPion HCl ER 8-90 MG TB12 Start 1 tablet every morning for 7 days, then 1 tablet twice daily for 7 days, then 2 tablets every morning and one in the evening (Patient not taking: Reported on 05/13/2022)   pantoprazole (PROTONIX) 20 MG tablet Take 20 mg by mouth daily.   terbinafine (LAMISIL) 250 MG tablet TAKE 1 TABLET BY MOUTH EVERY DAY (Patient not taking: Reported on 05/13/2022)   tretinoin (RETIN-A) 0.025 % cream Apply topically at bedtime. Qhs to face   valACYclovir (VALTREX) 1000 MG tablet Take 2 tablets a first sign of flare repeat in 12 hours   No facility-administered medications prior to visit.    Review of Systems  {Labs  Heme  Chem  Endocrine  Serology   Results Review (optional):23779}   Objective    There were no vitals taken for this visit. {Show previous vital signs (optional):23777}  Physical Exam  ***  No results found for any visits on 07/13/22.  Assessment & Plan     ***  No follow-ups on file.      {provider attestation***:1}   [name], [place] [phone] (phone) [phone] (fax)  [place]

## 2022-07-11 NOTE — Telephone Encounter (Signed)
[facility] faxed refill request for the following medications:  ipratropium (ATROVENT) 0.03 % nasal spray   Please advise.

## 2022-07-11 NOTE — Telephone Encounter (Signed)
    Chief Complaint: Stepped on rusty nail Sunday to left heel of foot Symptoms: Tender Frequency: Sunday Pertinent Negatives: Patient denies fever Disposition: '[]'$ ED /'[]'$ Urgent Care (no appt availability in office) / '[x]'$ Appointment(In office/virtual)/ '[]'$  [facility]/ '[]'$ Home Care/ '[]'$ Refused Recommended Disposition /'[]'$ [place] [facility]/ '[]'$  Follow-up with PCP Additional Notes: Call back for worsening of symptoms.  Reason for Disposition  [1] Redness of the skin AND [2] no fever  Answer Assessment - Initial Assessment Questions 1. ONSET: "When did the pain start?"      Sunday 2. LOCATION: "Where is the pain located?"      Heel 3. PAIN: "How bad is the pain?"    (Scale 1-10; or mild, moderate, severe)  - MILD (1-3): doesn't interfere with normal activities.   - MODERATE (4-7): interferes with normal activities (e.g., work or school) or awakens from sleep, limping.   - SEVERE (8-10): excruciating pain, unable to do any normal activities, unable to walk.      Mild 4. WORK OR EXERCISE: "Has there been any recent work or exercise that involved this part of the body?"      Stepped on rusty nail 5. CAUSE: "What do you think is causing the foot pain?"     Nail 6. OTHER SYMPTOMS: "Do you have any other symptoms?" (e.g., leg pain, rash, fever, numbness)     No 7. PREGNANCY: "Is there any chance you are pregnant?" "When was your last menstrual period?"     No  Protocols used: Foot Pain-A-AH

## 2022-07-12 ENCOUNTER — Ambulatory Visit: Payer: BC Managed Care – PPO | Admitting: Family Medicine

## 2022-07-13 ENCOUNTER — Ambulatory Visit (INDEPENDENT_AMBULATORY_CARE_PROVIDER_SITE_OTHER): Payer: BC Managed Care – PPO | Admitting: Family Medicine

## 2022-07-13 DIAGNOSIS — Z23 Encounter for immunization: Secondary | ICD-10-CM | POA: Diagnosis not present

## 2022-07-17 ENCOUNTER — Other Ambulatory Visit: Payer: Self-pay | Admitting: Family Medicine

## 2022-07-17 DIAGNOSIS — B9689 Other specified bacterial agents as the cause of diseases classified elsewhere: Secondary | ICD-10-CM

## 2022-07-19 ENCOUNTER — Ambulatory Visit: Admission: RE | Admit: 2022-07-19 | Payer: BC Managed Care – PPO | Source: Ambulatory Visit

## 2022-07-29 ENCOUNTER — Telehealth: Payer: Self-pay

## 2022-07-29 NOTE — Telephone Encounter (Signed)
Noted  

## 2022-07-29 NOTE — Telephone Encounter (Signed)
Lm for patient to see if she has record of previous sleep study and request that she bring SD card to appt.

## 2022-08-02 ENCOUNTER — Institutional Professional Consult (permissible substitution): Payer: BC Managed Care – PPO | Admitting: Adult Health

## 2022-08-09 ENCOUNTER — Institutional Professional Consult (permissible substitution): Payer: BC Managed Care – PPO | Admitting: Adult Health

## 2022-08-15 NOTE — Telephone Encounter (Signed)
Lm for reminder to bring SD card to 08/16/2022 appt.

## 2022-08-16 ENCOUNTER — Encounter: Payer: Self-pay | Admitting: Primary Care

## 2022-08-16 ENCOUNTER — Telehealth: Payer: Self-pay | Admitting: *Deleted

## 2022-08-16 ENCOUNTER — Ambulatory Visit (INDEPENDENT_AMBULATORY_CARE_PROVIDER_SITE_OTHER): Payer: BC Managed Care – PPO | Admitting: Primary Care

## 2022-08-16 DIAGNOSIS — G4733 Obstructive sleep apnea (adult) (pediatric): Secondary | ICD-10-CM | POA: Diagnosis not present

## 2022-08-16 NOTE — Patient Instructions (Signed)
Plan is to get download from your CPAP machine to ensure that you are not having any residual apneas on current CPAP pressure settings.  If you are having residual apneas we will likely need an in lab CPAP titration study to determine correct pressure settings.  If you are not having residual apneas recommend checking an overnight oximetry test to make sure your oxygen level is normal.  If everything looks fine will recommend that you return to see PCP or oncologist to follow-up on elevated hemoglobin levels. We can fax office notes to your dentist for oral appliance consideration, this is typically not covered by medical insurance.  Orders: Download philips CPAP for compliance check   Follow-up 3 months with Beth NP or sooner if needed

## 2022-08-16 NOTE — Progress Notes (Signed)
'@Patient'  ID: Joyce Decker, female    DOB: [birth year], 66 y.o.   MRN: [id number]  Chief Complaint  Patient presents with   Consult    Has a CPAP machine, no one has ever followed it.  Had increased RBCs in lab work at oncologist office.    Referring provider: [name], FNP  HPI: 66 year old female, former smoker.  Past medical history significant for sleep apnea, history of ovarian cancer, BRCA 1 gene mutation, hyperlipidemia, abnormal hemoglobin.  9/19/2023a Patient presents today for sleep consult. Hgb elevated, she follows with oncology. Concern for underlying/uncontrolled OSA. She was diagnosed with sleep apnea in 2015 and was started on CPAP. She had no follow-up, she tells me her original sleep doctor left. She is currently using auto Philips CPAP machine which was replaced last year. No SD card or download available. She reports compliance with CPAP use. DME is [facility].  Sleep questionnaire Sleep problem-diagnosed with polycythemia; patient has symptoms of snoring and daytime sleepiness Previous sleep study-[facility] December 12th 2015, AHI 25.9 an hour with spo2 low 81% (baseline 97%) Typical bedtime-930 to 10:30 PM How long to fall asleep-10 minutes with melatonin Nocturnal awakenings-none Out of bed/start of day-530/6 AM Do you operate heavy machinery-no Weight changes in last 2 years-up 25 pounds Do you currently wear CPAP-yes, unsure pressure settings Do you wear oxygen-no Epworth-8  Allergies  Allergen Reactions   Other Other (See Comments)    Wheat barley and Grain unknown allergy, possible cause headache.   Tape Other (See Comments)    Blisters   Gadobenate Nausea Only    PT became nauseous with contrast, but did not vomit.     Iodinated Contrast Media Nausea Only    Immunization History  Administered Date(s) Administered   DTaP 03/30/1961, 04/27/1961, 06/09/1961, 04/02/1964   IPV 03/30/1961, 04/27/1961, 06/09/1961, 07/16/1964   Influenza, Quadrivalent,  Recombinant, Inj, Pf 11/05/2021   Influenza,inj,Quad PF,6+ Mos 09/06/2017, 08/07/2019   MMR 06/09/2005, 07/08/2005   Moderna Sars-Covid-2 Vaccination 11/24/2020   PFIZER(Purple Top)SARS-COV-2 Vaccination 03/14/2020, 05/17/2020   Smallpox 04/02/1964   Td 07/11/1974, 08/19/2003   Tdap 06/22/2015, 07/13/2022    Past Medical History:  Diagnosis Date   Asthma    BRCA1 gene mutation positive    Cancer ([place]) 2006   ovarian cancer 2006   Gestational diabetes    Hyperthyroidism    Hypothyroidism    Kidney stones    Personal history of chemotherapy 2006   ovarian ca   Sleep apnea    CPAP    Tobacco History: Social History   Tobacco Use  Smoking Status Former   Packs/day: 0.50   Types: Cigarettes  Smokeless Tobacco Never  Tobacco Comments   35 years ago   Counseling given: Not Answered Tobacco comments: 35 years ago   Outpatient Medications Prior to Visit  Medication Sig Dispense Refill   CRANBERRY PO Take 1 capsule by mouth daily.     diclofenac Sodium (VOLTAREN ARTHRITIS PAIN) 1 % GEL Apply 4 g topically 4 (four) times daily. 150 g 11   Melatonin 3 MG TABS Take 1 tablet by mouth at bedtime.     Multiple Vitamin (MULTIVITAMIN) capsule Take 1 capsule by mouth daily.     pantoprazole (PROTONIX) 20 MG tablet Take 20 mg by mouth daily as needed.     tretinoin (RETIN-A) 0.025 % cream Apply topically at bedtime. Qhs to face 45 g 6   valACYclovir (VALTREX) 1000 MG tablet Take 2 tablets a first sign of flare  repeat in 12 hours 30 tablet 0   BIOTIN PO Take 1 tablet by mouth daily.  (Patient not taking: Reported on 08/16/2022)     calcium-vitamin D (OSCAL WITH D) 500-200 MG-UNIT per tablet Take 1 tablet by mouth. (Patient not taking: Reported on 08/16/2022)     Cinnamon 500 MG capsule Take 500 mg by mouth daily. (Patient not taking: Reported on 08/16/2022)     gabapentin (NEURONTIN) 100 MG capsule Take 1 capsule (100 mg total) by mouth 3 (three) times daily. (Patient not taking:  Reported on 08/16/2022) 90 capsule 3   ipratropium (ATROVENT) 0.03 % nasal spray PLACE 2 SPRAYS INTO BOTH NOSTRILS EVERY 12 (TWELVE) HOURS. (Patient not taking: Reported on 08/16/2022) 30 mL 0   meloxicam (MOBIC) 15 MG tablet TAKE 1 TABLET BY MOUTH EVERY DAY (Patient not taking: Reported on 08/16/2022) 30 tablet 1   methocarbamol (ROBAXIN-750) 750 MG tablet Take 1 tablet (750 mg total) by mouth at bedtime. (Patient not taking: Reported on 08/16/2022) 30 tablet 2   Naltrexone-buPROPion HCl ER 8-90 MG TB12 Start 1 tablet every morning for 7 days, then 1 tablet twice daily for 7 days, then 2 tablets every morning and one in the evening (Patient not taking: Reported on 08/16/2022) 120 tablet 1   terbinafine (LAMISIL) 250 MG tablet TAKE 1 TABLET BY MOUTH EVERY DAY (Patient not taking: Reported on 08/16/2022) 90 tablet 0   No facility-administered medications prior to visit.   Review of Systems  Review of Systems  Constitutional:  Positive for fatigue.  HENT: Negative.    Respiratory: Negative.       Physical Exam  BP 120/80 (BP Location: Left Arm, Patient Position: Sitting, Cuff Size: Normal)   Pulse 69   Temp 98 F (36.7 C) (Oral)   Ht '5\' 3"'  (1.6 m)   Wt 176 lb (79.8 kg)   SpO2 95%   BMI 31.18 kg/m  Physical Exam Constitutional:      General: She is not in acute distress.    Appearance: Normal appearance. She is not ill-appearing or toxic-appearing.  Cardiovascular:     Rate and Rhythm: Normal rate.  Pulmonary:     Effort: Pulmonary effort is normal.  Musculoskeletal:        General: Normal range of motion.  Skin:    General: Skin is warm and dry.  Neurological:     General: No focal deficit present.     Mental Status: She is alert and oriented to person, place, and time. Mental status is at baseline.  Psychiatric:        Mood and Affect: Mood normal.        Behavior: Behavior normal.        Thought Content: Thought content normal.        Judgment: Judgment normal.      Lab  Results:  CBC    Component Value Date/Time   WBC 7.7 05/12/2022 1605   RBC 5.26 (H) 05/12/2022 1605   HGB 15.4 (H) 05/12/2022 1605   HGB 14.6 02/11/2019 1629   HCT 45.3 05/12/2022 1605   HCT 42.1 02/11/2019 1629   PLT 246 05/12/2022 1605   PLT 266 02/11/2019 1629   MCV 86.1 05/12/2022 1605   MCV 86 02/11/2019 1629   MCH 29.3 05/12/2022 1605   MCHC 34.0 05/12/2022 1605   RDW 13.4 05/12/2022 1605   RDW 12.2 02/11/2019 1629   LYMPHSABS 2.5 05/12/2022 1605   LYMPHSABS 2.3 02/11/2019 1629   MONOABS 0.7 05/12/2022  1605   EOSABS 0.1 05/12/2022 1605   EOSABS 0.1 02/11/2019 1629   BASOSABS 0.1 05/12/2022 1605   BASOSABS 0.1 02/11/2019 1629    BMET    Component Value Date/Time   NA 138 05/12/2022 1605   NA 141 02/11/2019 1629   K 3.6 05/12/2022 1605   K 3.5 03/02/2012 1509   CL 105 05/12/2022 1605   CO2 26 05/12/2022 1605   GLUCOSE 97 05/12/2022 1605   BUN 21 05/12/2022 1605   BUN 15 02/11/2019 1629   CREATININE 0.96 05/12/2022 1605   CALCIUM 9.2 05/12/2022 1605   GFRNONAA >60 05/12/2022 1605   GFRAA >60 01/28/2020 1609    BNP No results found for: "BNP"  ProBNP No results found for: "PROBNP"  Imaging: No results found.   Assessment & Plan:   Apnea, sleep - Referred d.t elevated hemoglobin level. She was dx with sleep apna in 2015 and started on CPAP but lost to follow-up. She has a Philips auto CPAP machine. She reports compliance with use. No download available. She continues to have symptoms of snoring and daytime sleepiness. We will need [employer] to see if they can get data for [place] to review. If she is not having any breakthrough apneas on current CPAP settings would recommend checking ONO to ensure there is no nocturnal hypoxemia.   Martyn Ehrich, NP 08/27/2022

## 2022-08-16 NOTE — Telephone Encounter (Signed)
Called and spoke with [facility] with [employer], supplied him with the SN and REF #.  Awaiting her Airsense 2 to be enrolled in Care [employer]ator.  Provided fax # to send download, [phone].  Will f/u once we receive download.

## 2022-08-22 NOTE — Telephone Encounter (Signed)
Download is available in care orchestrator. Download printed and faxed to [place] office ATTN:Beth.

## 2022-08-22 NOTE — Telephone Encounter (Signed)
Joyce Decker- This is a patient we saw in [place]. Can we follow-up to see if [employer] was able to enroll patient in care orchestrator/ if so need download faxed attn Beth   Cc: Lesleigh Noe

## 2022-08-23 NOTE — Telephone Encounter (Signed)
Lm for patient.  

## 2022-08-23 NOTE — Telephone Encounter (Signed)
Compliance download from 07/22/22 - 08/20/2022 showed patient is 100% compliant with CPAP use.  Device DreamStation 2.  Mask DreamWear.  Other than compliance there is no information about pressure settings or AHI.  Can we call [employer] to see if there is any more information. Other wise I would recommend we get an in lab CPAP titration study to ensure that she is on the correct pressure settings and does not need oxygen.

## 2022-08-23 NOTE — Telephone Encounter (Signed)
Spoke to patient and relayed below message.  Spoke to [place] with [employer]. He stated that he would request AHI report and settings, as he does not have access to care orchestrator. According to [employer]'s notes, cpap is currently set at 9cm.  Will hold message to ensure f/u.

## 2022-08-26 NOTE — Telephone Encounter (Signed)
Spoke to [place] with [facility] and requested update. He stated that he was able to obtain report. He will fax report to office.  Settings are 5-20 and AHI 3.4.

## 2022-08-27 NOTE — Assessment & Plan Note (Addendum)
-   Referred d.t elevated hemoglobin level. She was dx with sleep apna in 2015 and started on CPAP but lost to follow-up. She has a Philips auto CPAP machine. She reports compliance with use. No download available. She continues to have symptoms of snoring and daytime sleepiness. We will need [employer] to see if they can get data for [place] to review. If she is not having any breakthrough apneas on current CPAP settings would recommend checking ONO to ensure there is no nocturnal hypoxemia.

## 2022-08-29 NOTE — Telephone Encounter (Signed)
Lm to make patient aware of pressure change.

## 2022-08-29 NOTE — Telephone Encounter (Signed)
Thank you, make sure order has been placed - can we update settings 5-20cm h20

## 2022-08-29 NOTE — Telephone Encounter (Signed)
Report has been emailed to [employer].

## 2022-08-30 ENCOUNTER — Telehealth: Payer: Self-pay | Admitting: Primary Care

## 2022-08-30 DIAGNOSIS — G4733 Obstructive sleep apnea (adult) (pediatric): Secondary | ICD-10-CM

## 2022-08-30 NOTE — Telephone Encounter (Signed)
Lm x1 for patient.  

## 2022-08-30 NOTE — Telephone Encounter (Signed)
Per telephone note, with [employer] stating her pressure was auto 5-20 with residual AHI 3.4.  She is having a couple of breakthrough events, it's not significant but I think its best she has a CPAP titration study in lab to assess pressure settings and need for oxygen

## 2022-08-30 NOTE — Telephone Encounter (Signed)
Lm x2 for patient. Letter has been mailed to address on file.  Will close encounter per office protocol.   

## 2022-08-30 NOTE — Telephone Encounter (Signed)
Pt returning call from encounter on 9/19. Advised pt that a letter was mailed to her regarding the pressure change for her CPAP. Pt was confused and upset that we can change things without telling the pt first. Advised pt that I would place a message for Margie to speak with pt regarding this. ONLY CALL PT AFTER 3:30. 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

## 2022-08-30 NOTE — Telephone Encounter (Signed)
Please see 08/16/2022 phone note.  Patient is aware of pressure change. Order has been placed to [employer]. She is wanting to know if they download showed any breakthrough events. She would also like a copy of the report.  Beth, please advise. Thanks

## 2022-08-31 NOTE — Telephone Encounter (Signed)
Lm for patient.  

## 2022-09-01 NOTE — Telephone Encounter (Signed)
Patient is aware of below message and voiced her understanding.  She stated that she would like to wait until May for titration study, as she will be switching insurance around that time.  She would like to know if ONO would be an option in the meantime. She would like more information about the inspire device.  Beth, please advise. Thanks

## 2022-09-01 NOTE — Telephone Encounter (Signed)
Lm x2 for patient.  Will try once more due to nature of call.

## 2022-09-05 NOTE — Telephone Encounter (Signed)
Yes we can get ONO on CPAP. Set up virtual visit with me to discuss ONO results in about a week and inspire

## 2022-09-06 NOTE — Telephone Encounter (Signed)
Lm x1 for patient.  

## 2022-09-07 NOTE — Telephone Encounter (Signed)
Patient is returning a call. Stated that if the nurse could call after 3:30, she would be able to answer the call.  CB# [phone]

## 2022-09-07 NOTE — Telephone Encounter (Signed)
Spoke to patient and relayed below message/recommendations. ONO ordered. Mychart visit scheduled 09/26/2022 at 4:00. Nothing further needed.

## 2022-09-07 NOTE — Telephone Encounter (Signed)
Lm x2 for patient.  Will call back once more due to nature of call.

## 2022-09-22 NOTE — Progress Notes (Signed)
MyChart Video Visit  Virtual Visit via Video Note   This visit type was conducted due to national recommendations for restrictions regarding the COVID-19 Pandemic (e.g. social distancing) in an effort to limit this patient's exposure and mitigate transmission in our community. This patient is at least at moderate risk for complications without adequate follow up. This format is felt to be most appropriate for this patient at this time. Physical exam was limited by quality of the video and audio technology used for the visit.   Patient location: work/school Provider location:  [facility] [address]  [place] [address] [place], [place] [address]  I discussed the limitations of evaluation and management by telemedicine and the availability of in person appointments. The patient expressed understanding and agreed to proceed.  Patient: Joyce Decker   DOB: 04-04-1956   66 y.o. Female  MRN: [id number] Visit Date: 09/28/2022  Today's healthcare provider: [name], FNP Re Introduced to nurse practitioner role and practice setting.  All questions answered.  Discussed provider/patient relationship and expectations.  I,Tiffany J Bragg,acting as a scribe for [name], FNP.,have documented all relevant documentation on the behalf of [name], FNP,as directed by  [name], FNP while in the presence of [name], FNP.   Chief Complaint  Patient presents with   Dizziness    Patient complains of lightheadedness, shaking, disoriented for 2 weeks.    Subjective    HPI HPI     Dizziness    Additional comments: Patient complains of lightheadedness, shaking, disoriented for 2 weeks.       Last edited by Smitty Knudsen, CMA on 09/28/2022  3:57 PM.      Medications: Outpatient Medications Prior to Visit  Medication Sig   diclofenac Sodium (VOLTAREN ARTHRITIS PAIN) 1 % GEL Apply 4 g topically 4 (four) times daily.   pantoprazole (PROTONIX) 20 MG tablet Take  20 mg by mouth daily as needed.   tretinoin (RETIN-A) 0.025 % cream Apply topically at bedtime. Qhs to face   [DISCONTINUED] amoxicillin-clavulanate (AUGMENTIN) 875-125 MG tablet Take 1 tablet by mouth 2 (two) times daily.   [DISCONTINUED] BIOTIN PO Take 1 tablet by mouth daily.   [DISCONTINUED] calcium-vitamin D (OSCAL WITH D) 500-200 MG-UNIT per tablet Take 1 tablet by mouth.   [DISCONTINUED] Cinnamon 500 MG capsule Take 500 mg by mouth daily.   [DISCONTINUED] CRANBERRY PO Take 1 capsule by mouth daily.   [DISCONTINUED] gabapentin (NEURONTIN) 100 MG capsule Take 1 capsule (100 mg total) by mouth 3 (three) times daily.   [DISCONTINUED] ipratropium (ATROVENT) 0.03 % nasal spray Place 2 sprays into both nostrils every 12 (twelve) hours.   [DISCONTINUED] Melatonin 3 MG TABS Take 1 tablet by mouth at bedtime.   [DISCONTINUED] meloxicam (MOBIC) 15 MG tablet TAKE 1 TABLET BY MOUTH EVERY DAY   [DISCONTINUED] methocarbamol (ROBAXIN-750) 750 MG tablet Take 1 tablet (750 mg total) by mouth at bedtime.   [DISCONTINUED] Multiple Vitamin (MULTIVITAMIN) capsule Take 1 capsule by mouth daily.   [DISCONTINUED] Naltrexone-buPROPion HCl ER 8-90 MG TB12 Start 1 tablet every morning for 7 days, then 1 tablet twice daily for 7 days, then 2 tablets every morning and one in the evening   [DISCONTINUED] terbinafine (LAMISIL) 250 MG tablet TAKE 1 TABLET BY MOUTH EVERY DAY   [DISCONTINUED] valACYclovir (VALTREX) 1000 MG tablet Take 2 tablets a first sign of flare repeat in 12 hours   No facility-administered medications prior to visit.  Review of Systems   Objective    Ht '5\' 4"'$  (1.626 m)   BMI 30.21 kg/m   Physical Exam Constitutional:      Appearance: Normal appearance.  Pulmonary:     Effort: Pulmonary effort is normal.  Skin:    General: Skin is dry.  Neurological:     General: No focal deficit present.     Mental Status: She is alert and oriented to person, place, and time. Mental status is at  baseline.  Psychiatric:        Mood and Affect: Mood normal.        Behavior: Behavior normal.        Thought Content: Thought content normal.        Judgment: Judgment normal.     Assessment & Plan     Problem List Items Addressed This Visit       Endocrine   Hypoglycemia    Reports symptoms similar to when she had GDM C/o lightheadedness, shaky, altered orientation; all c/f hypoglycemia Denies syncope/collapse      Relevant Orders   Comprehensive Metabolic Panel (CMET)   Hemoglobin A1c     Genitourinary   ATN (acute tubular necrosis) (HCC)    Hx of ATN requiring mg supplementation via IV following cancer treatment Repeat CMP and Mg levels      Relevant Orders   Magnesium   Comprehensive Metabolic Panel (CMET)     Other   Abnormal hemoglobin (HCC)    Chronic, previously stable Use of CPAP; does not smoke Followed by hem Repeat CBC      Relevant Orders   Comprehensive Metabolic Panel (CMET)   Disorder of magnesium metabolism    Hx of ATN requiring mg supplementation via IV following cancer treatment Repeat CMP and Mg levels       Relevant Orders   Magnesium   Encounter for hepatitis C screening test for low risk patient    Low risk screen Treatable, and curable. If left untreated Hep C can lead to cirrhosis and liver failure. Encourage routine testing; recommend repeat testing if risk factors change.       Relevant Orders   Hepatitis C Antibody   Encounter for screening for HIV    Low risk screen Consented; encouraged to "know your status" Recommend repeat screen if risk factors change       Relevant Orders   HIV antibody (with reflex)   Encounter for vitamin deficiency screening    Recommend Vit B/D series given muscle involvement in symptoms      Relevant Orders   Iron, TIBC and Ferritin Panel   Vitamin D (25 hydroxy)   B12 and Folate Panel   Family history of diabetes mellitus    Both T1 and T2DM      Relevant Orders    Comprehensive Metabolic Panel (CMET)   Hemoglobin A1c   History of gestational diabetes    Noted in 3rd pregnancy; controlled with diet/exercise       HLD (hyperlipidemia)    Chronic, previously controlled with diet/exericse Weight remains elevated Repeat LP Goal <100 given strong family hx       Relevant Orders   Lipid panel   Screening for thyroid disorder    Add thyroid screening given shaky feeling and lightheadedness       Relevant Orders   TSH + free T4   Shakiness    Acute, episodic; noted for ~ 2 weeks Was recently treated for bacterial URI 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 Denies travel      Relevant Orders   Comprehensive Metabolic Panel (CMET)   Hemoglobin A1c   Iron, TIBC and Ferritin Panel   Vitamin D (25 hydroxy)   B12 and Folate Panel   Vitamin B6   Transient alteration of awareness    Patient describes it as "subtle disorientation" however, reports s/s at store and was able to call family member for assistance       Relevant Orders   CBC with Differential/Platelet   Magnesium   TSH + free T4   Comprehensive Metabolic Panel (CMET)   Iron, TIBC and Ferritin Panel   Vitamin D (25 hydroxy)   B12 and Folate Panel   Vitamin B6   Vasovagal symptom - Primary    Reports flushing; slow warming Denies syncope Denies LOC Is post menopausal since hysterectomy       Relevant Orders   CBC with Differential/Platelet   Magnesium   TSH + free T4   Comprehensive Metabolic Panel (CMET)   Iron, TIBC and Ferritin Panel   Vitamin D (25 hydroxy)   B12 and Folate Panel   Vitamin B6   Return if symptoms worsen or fail to improve. Or seek emergent care if after hours/weekend.   I discussed the assessment and treatment plan with the patient. The patient was provided an opportunity to ask questions and all were answered. The patient agreed with the plan and demonstrated an understanding of the instructions.   The patient was advised to call back or  seek an in-person evaluation if the symptoms worsen or if the condition fails to improve as anticipated.  I provided 20 minutes of face-to-face time during this encounter discussing current symptoms and necessary testing and home management.  Vonna Kotyk, FNP, have reviewed all documentation for this visit. The documentation on 09/28/22 for the exam, diagnosis, procedures, and orders are all accurate and complete.   [name], [place] [phone] (phone) [phone] (fax)  [place]

## 2022-09-23 ENCOUNTER — Telehealth: Payer: BC Managed Care – PPO | Admitting: Physician Assistant

## 2022-09-23 DIAGNOSIS — J019 Acute sinusitis, unspecified: Secondary | ICD-10-CM

## 2022-09-23 DIAGNOSIS — B9689 Other specified bacterial agents as the cause of diseases classified elsewhere: Secondary | ICD-10-CM | POA: Diagnosis not present

## 2022-09-23 MED ORDER — AMOXICILLIN-POT CLAVULANATE 875-125 MG PO TABS: 1.0000 | ORAL_TABLET | Freq: Two times a day (BID) | ORAL | 0 refills | Status: DC

## 2022-09-23 MED ORDER — IPRATROPIUM BROMIDE 0.03 % NA SOLN: 2.0000 | Freq: Two times a day (BID) | NASAL | 0 refills | Status: DC

## 2022-09-23 NOTE — Patient Instructions (Signed)
Joyce Decker, thank you for joining Mar Daring, PA-C for today's virtual visit.  While this provider is not your primary care provider (PCP), if your PCP is located in our provider database this encounter information will be shared with them immediately following your visit.   [facility] account gives you access to today's visit and all your visits, tests, and labs performed at [facility] " click here if you don't have a [facility] account or go to [email or web address]  Consent: (Patient) Joyce Decker provided verbal consent for this virtual visit at the beginning of the encounter.  Current Medications:  Current Outpatient Medications:    amoxicillin-clavulanate (AUGMENTIN) 875-125 MG tablet, Take 1 tablet by mouth 2 (two) times daily., Disp: 20 tablet, Rfl: 0   BIOTIN PO, Take 1 tablet by mouth daily.  (Patient not taking: Reported on 08/16/2022), Disp: , Rfl:    calcium-vitamin D (OSCAL WITH D) 500-200 MG-UNIT per tablet, Take 1 tablet by mouth. (Patient not taking: Reported on 08/16/2022), Disp: , Rfl:    Cinnamon 500 MG capsule, Take 500 mg by mouth daily. (Patient not taking: Reported on 08/16/2022), Disp: , Rfl:    CRANBERRY PO, Take 1 capsule by mouth daily., Disp: , Rfl:    diclofenac Sodium (VOLTAREN ARTHRITIS PAIN) 1 % GEL, Apply 4 g topically 4 (four) times daily., Disp: 150 g, Rfl: 11   gabapentin (NEURONTIN) 100 MG capsule, Take 1 capsule (100 mg total) by mouth 3 (three) times daily. (Patient not taking: Reported on 08/16/2022), Disp: 90 capsule, Rfl: 3   ipratropium (ATROVENT) 0.03 % nasal spray, Place 2 sprays into both nostrils every 12 (twelve) hours., Disp: 30 mL, Rfl: 0   Melatonin 3 MG TABS, Take 1 tablet by mouth at bedtime., Disp: , Rfl:    meloxicam (MOBIC) 15 MG tablet, TAKE 1 TABLET BY MOUTH EVERY DAY (Patient not taking: Reported on 08/16/2022), Disp: 30 tablet, Rfl: 1   methocarbamol (ROBAXIN-750) 750 MG tablet, Take 1  tablet (750 mg total) by mouth at bedtime. (Patient not taking: Reported on 08/16/2022), Disp: 30 tablet, Rfl: 2   Multiple Vitamin (MULTIVITAMIN) capsule, Take 1 capsule by mouth daily., Disp: , Rfl:    Naltrexone-buPROPion HCl ER 8-90 MG TB12, Start 1 tablet every morning for 7 days, then 1 tablet twice daily for 7 days, then 2 tablets every morning and one in the evening (Patient not taking: Reported on 08/16/2022), Disp: 120 tablet, Rfl: 1   pantoprazole (PROTONIX) 20 MG tablet, Take 20 mg by mouth daily as needed., Disp: , Rfl:    terbinafine (LAMISIL) 250 MG tablet, TAKE 1 TABLET BY MOUTH EVERY DAY (Patient not taking: Reported on 08/16/2022), Disp: 90 tablet, Rfl: 0   tretinoin (RETIN-A) 0.025 % cream, Apply topically at bedtime. Qhs to face, Disp: 45 g, Rfl: 6   valACYclovir (VALTREX) 1000 MG tablet, Take 2 tablets a first sign of flare repeat in 12 hours, Disp: 30 tablet, Rfl: 0   Medications ordered in this encounter:  Meds ordered this encounter  Medications   amoxicillin-clavulanate (AUGMENTIN) 875-125 MG tablet    Sig: Take 1 tablet by mouth 2 (two) times daily.    Dispense:  20 tablet    Refill:  0    Order Specific Question:   Supervising Provider    Answer:   Chase Picket [[id number]]   ipratropium (ATROVENT) 0.03 % nasal spray    Sig: Place 2 sprays into both nostrils every 12 (  twelve) hours.    Dispense:  30 mL    Refill:  0    Order Specific Question:   Supervising Provider    Answer:   Chase Picket [[id number]]     *If you need refills on other medications prior to your next appointment, please contact your pharmacy*  Follow-Up: Call back or seek an in-person evaluation if the symptoms worsen or if the condition fails to improve as anticipated.  [facility] [phone]  Other Instructions  Sinus Infection, Adult A sinus infection, also called sinusitis, is inflammation of your sinuses. Sinuses are hollow spaces in the bones around your face.  Your sinuses are located: Around your eyes. In the middle of your forehead. Behind your nose. In your cheekbones. Mucus normally drains out of your sinuses. When your nasal tissues become inflamed or swollen, mucus can become trapped or blocked. This allows bacteria, viruses, and fungi to grow, which leads to infection. Most infections of the sinuses are caused by a virus. A sinus infection can develop quickly. It can last for up to 4 weeks (acute) or for more than 12 weeks (chronic). A sinus infection often develops after a cold. What are the causes? This condition is caused by anything that creates swelling in the sinuses or stops mucus from draining. This includes: Allergies. Asthma. Infection from bacteria or viruses. Deformities or blockages in your nose or sinuses. Abnormal growths in the nose (nasal polyps). Pollutants, such as chemicals or irritants in the air. Infection from fungi. This is rare. What increases the risk? You are more likely to develop this condition if you: Have a weak body defense system (immune system). Do a lot of swimming or diving. Overuse nasal sprays. Smoke. What are the signs or symptoms? The main symptoms of this condition are pain and a feeling of pressure around the affected sinuses. Other symptoms include: Stuffy nose or congestion that makes it difficult to breathe through your nose. Thick yellow or greenish drainage from your nose. Tenderness, swelling, and warmth over the affected sinuses. A cough that may get worse at night. Decreased sense of smell and taste. Extra mucus that collects in the throat or the back of the nose (postnasal drip) causing a sore throat or bad breath. Tiredness (fatigue). Fever. How is this diagnosed? This condition is diagnosed based on: Your symptoms. Your medical history. A physical exam. Tests to find out if your condition is acute or chronic. This may include: Checking your nose for nasal polyps. Viewing  your sinuses using a device that has a light (endoscope). Testing for allergies or bacteria. Imaging tests, such as an MRI or CT scan. In rare cases, a bone biopsy may be done to rule out more serious types of fungal sinus disease. How is this treated? Treatment for a sinus infection depends on the cause and whether your condition is chronic or acute. If caused by a virus, your symptoms should go away on their own within 10 days. You may be given medicines to relieve symptoms. They include: Medicines that shrink swollen nasal passages (decongestants). A spray that eases inflammation of the nostrils (topical intranasal corticosteroids). Rinses that help get rid of thick mucus in your nose (nasal saline washes). Medicines that treat allergies (antihistamines). Over-the-counter pain relievers. If caused by bacteria, your health care provider may recommend waiting to see if your symptoms improve. Most bacterial infections will get better without antibiotic medicine. You may be given antibiotics if you have: A severe infection. A  weak immune system. If caused by narrow nasal passages or nasal polyps, surgery may be needed. Follow these instructions at home: Medicines Take, use, or apply over-the-counter and prescription medicines only as told by your health care provider. These may include nasal sprays. If you were prescribed an antibiotic medicine, take it as told by your health care provider. Do not stop taking the antibiotic even if you start to feel better. Hydrate and humidify  Drink enough fluid to keep your urine pale yellow. Staying hydrated will help to thin your mucus. Use a cool mist humidifier to keep the humidity level in your home above 50%. Inhale steam for 10-15 minutes, 3-4 times a day, or as told by your health care provider. You can do this in the bathroom while a hot shower is running. Limit your exposure to cool or dry air. Rest Rest as much as possible. Sleep with your  head raised (elevated). Make sure you get enough sleep each night. General instructions  Apply a warm, moist washcloth to your face 3-4 times a day or as told by your health care provider. This will help with discomfort. Use nasal saline washes as often as told by your health care provider. Wash your hands often with soap and water to reduce your exposure to germs. If soap and water are not available, use hand sanitizer. Do not smoke. Avoid being around people who are smoking (secondhand smoke). Keep all follow-up visits. This is important. Contact a health care provider if: You have a fever. Your symptoms get worse. Your symptoms do not improve within 10 days. Get help right away if: You have a severe headache. You have persistent vomiting. You have severe pain or swelling around your face or eyes. You have vision problems. You develop confusion. Your neck is stiff. You have trouble breathing. These symptoms may be an emergency. Get help right away. Call 911. Do not wait to see if the symptoms will go away. Do not drive yourself to the hospital. Summary A sinus infection is soreness and inflammation of your sinuses. Sinuses are hollow spaces in the bones around your face. This condition is caused by nasal tissues that become inflamed or swollen. The swelling traps or blocks the flow of mucus. This allows bacteria, viruses, and fungi to grow, which leads to infection. If you were prescribed an antibiotic medicine, take it as told by your health care provider. Do not stop taking the antibiotic even if you start to feel better. Keep all follow-up visits. This is important. This information is not intended to replace advice given to you by your health care provider. Make sure you discuss any questions you have with your health care provider. Document Revised: 10/19/2021 Document Reviewed: 10/19/2021 Elsevier Patient Education  [place].    If you have been instructed to have  an in-person evaluation today at a local Urgent Care facility, please use the link below. It will take you to a list of all of our available [facility], including address, phone number and hours of operation. Please do not delay care.  [facility]  If you or a family member do not have a primary care provider, use the link below to schedule a visit and establish care. When you choose a [facility] primary care physician or advanced practice provider, you gain a long-term partner in health. Find a Primary Care Provider  Learn more about [facility]'s in-office and virtual care options: [facility]  Care Now

## 2022-09-23 NOTE — Progress Notes (Signed)
Virtual Visit Consent   Joyce Decker, you are scheduled for a virtual visit with a [facility] provider today. Just as with appointments in the office, your consent must be obtained to participate. Your consent will be active for this visit and any virtual visit you may have with one of our providers in the next 365 days. If you have a MyChart account, a copy of this consent can be sent to you electronically.  As this is a virtual visit, video technology does not allow for your provider to perform a traditional examination. This may limit your provider's ability to fully assess your condition. If your provider identifies any concerns that need to be evaluated in person or the need to arrange testing (such as labs, EKG, etc.), we will make arrangements to do so. Although advances in technology are sophisticated, we cannot ensure that it will always work on either your end or our end. If the connection with a video visit is poor, the visit may have to be switched to a telephone visit. With either a video or telephone visit, we are not always able to ensure that we have a secure connection.  By engaging in this virtual visit, you consent to the provision of healthcare and authorize for your insurance to be billed (if applicable) for the services provided during this visit. Depending on your insurance coverage, you may receive a charge related to this service.  I need to obtain your verbal consent now. Are you willing to proceed with your visit today? Joyce Decker has provided verbal consent on 09/23/2022 for a virtual visit (video or telephone). Joyce Daring, PA-C  Date: 09/23/2022 2:37 PM  Virtual Visit via Video Note   I, Joyce Decker, connected with  Joyce Decker  ([id number], 1956/03/20) on 09/23/22 at  2:30 PM EDT by a video-enabled telemedicine application and verified that I am speaking with the correct person using two identifiers.  Location: Patient: Virtual Visit Location  Patient: Home Provider: Virtual Visit Location Provider: Home Office   I discussed the limitations of evaluation and management by telemedicine and the availability of in person appointments. The patient expressed understanding and agreed to proceed.    History of Present Illness: Joyce Decker is a 66 y.o. who identifies as a female who was assigned female at birth, and is being seen today for possible sinus infection.  HPI: Sinusitis This is a new problem. The current episode started 1 to 4 weeks ago. The problem has been gradually worsening since onset. Maximum temperature: subjective fevers. The pain is mild. Associated symptoms include congestion, coughing, headaches and sinus pressure. Pertinent negatives include no chills, ear pain or hoarse voice. Treatments tried: mucinex. The treatment provided no relief.      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   Tape Other (See Comments)    Blisters   Gadobenate Nausea Only    PT became nauseous with contrast, but did not vomit.     Iodinated Contrast Media Nausea Only   Medications:  Current  Outpatient Medications:    amoxicillin-clavulanate (AUGMENTIN) 875-125 MG tablet, Take 1 tablet by mouth 2 (two) times daily., Disp: 20 tablet, Rfl: 0   BIOTIN PO, Take 1 tablet by mouth daily.  (Patient not taking: Reported on 08/16/2022), Disp: , Rfl:    calcium-vitamin D (OSCAL WITH D) 500-200 MG-UNIT per tablet, Take 1 tablet by mouth. (Patient not taking: Reported on 08/16/2022), Disp: , Rfl:    Cinnamon 500 MG capsule, Take 500 mg by mouth daily. (Patient not taking: Reported on 08/16/2022), Disp: , Rfl:    CRANBERRY PO, Take 1 capsule by mouth daily., Disp: , Rfl:    diclofenac Sodium (VOLTAREN ARTHRITIS PAIN) 1 % GEL, Apply 4 g topically 4 (four) times daily., Disp: 150 g, Rfl: 11   gabapentin (NEURONTIN) 100 MG capsule, Take 1 capsule (100 mg total) by mouth 3 (three) times daily. (Patient not taking: Reported on 08/16/2022), Disp: 90 capsule, Rfl: 3   ipratropium (ATROVENT) 0.03 % nasal spray, Place 2 sprays into both nostrils every 12 (twelve) hours., Disp: 30 mL, Rfl: 0   Melatonin 3 MG TABS, Take 1 tablet by mouth at bedtime., Disp: , Rfl:    meloxicam (MOBIC) 15 MG tablet, TAKE 1 TABLET BY MOUTH EVERY DAY (Patient not taking: Reported on 08/16/2022), Disp: 30 tablet, Rfl: 1   methocarbamol (ROBAXIN-750) 750 MG tablet, Take 1 tablet (750 mg total) by mouth at bedtime. (Patient not taking: Reported on 08/16/2022), Disp: 30 tablet, Rfl: 2   Multiple Vitamin (MULTIVITAMIN) capsule, Take 1 capsule by mouth daily., Disp: , Rfl:    Naltrexone-buPROPion HCl ER 8-90 MG TB12, Start 1 tablet every morning for 7 days, then 1 tablet twice daily for 7 days, then 2 tablets every morning and one in the evening (Patient not taking: Reported on 08/16/2022), Disp: 120 tablet, Rfl: 1   pantoprazole (PROTONIX) 20 MG tablet, Take 20 mg by mouth daily as needed., Disp: , Rfl:    terbinafine (LAMISIL) 250 MG tablet, TAKE 1 TABLET BY MOUTH EVERY DAY (Patient not taking: Reported on 08/16/2022), Disp: 90 tablet, Rfl: 0    tretinoin (RETIN-A) 0.025 % cream, Apply topically at bedtime. Qhs to face, Disp: 45 g, Rfl: 6   valACYclovir (VALTREX) 1000 MG tablet, Take 2 tablets a first sign of flare repeat in 12 hours, Disp: 30 tablet, Rfl: 0  Observations/Objective: Patient is well-developed, well-nourished in no acute distress.  Resting comfortably at home.  Head is normocephalic, atraumatic.  No labored breathing.  Speech is clear and coherent with logical content.  Patient is alert and oriented at baseline.    Assessment and Plan: 1. Acute bacterial sinusitis - amoxicillin-clavulanate (AUGMENTIN) 875-125 MG tablet; Take 1 tablet by mouth 2 (two) times daily.  Dispense: 20 tablet; Refill: 0 - ipratropium (ATROVENT) 0.03 % nasal spray; Place 2 sprays into both nostrils every 12 (twelve) hours.  Dispense: 30 mL; Refill: 0  - Worsening symptoms that have not responded to OTC medications.  - Will give Augmentin and Ipratropium bromide nasal spray - Continue allergy medications.  - Steam and humidifier can help - Stay well hydrated and  get plenty of rest.  - Seek in person evaluation if no symptom improvement or if symptoms worsen   Follow Up Instructions: I discussed the assessment and treatment plan with the patient. The patient was provided an opportunity to ask questions and all were answered. The patient agreed with the plan and demonstrated an understanding of the instructions.  A copy of instructions were sent to the patient via MyChart unless otherwise noted below.    The patient was advised to call back or seek an in-person evaluation if the symptoms worsen or if the condition fails to improve as anticipated.  Time:  I spent 8 minutes with the patient via telehealth technology discussing the above problems/concerns.    Joyce Daring, PA-C

## 2022-09-26 ENCOUNTER — Telehealth: Payer: BC Managed Care – PPO | Admitting: Primary Care

## 2022-09-28 ENCOUNTER — Encounter: Payer: Self-pay | Admitting: Family Medicine

## 2022-09-28 ENCOUNTER — Telehealth (INDEPENDENT_AMBULATORY_CARE_PROVIDER_SITE_OTHER): Payer: BC Managed Care – PPO | Admitting: Family Medicine

## 2022-09-28 VITALS — Ht 64.0 in

## 2022-09-28 DIAGNOSIS — Z8632 Personal history of gestational diabetes: Secondary | ICD-10-CM | POA: Insufficient documentation

## 2022-09-28 DIAGNOSIS — Z114 Encounter for screening for human immunodeficiency virus [HIV]: Secondary | ICD-10-CM | POA: Insufficient documentation

## 2022-09-28 DIAGNOSIS — E162 Hypoglycemia, unspecified: Secondary | ICD-10-CM

## 2022-09-28 DIAGNOSIS — E7800 Pure hypercholesterolemia, unspecified: Secondary | ICD-10-CM

## 2022-09-28 DIAGNOSIS — R55 Syncope and collapse: Secondary | ICD-10-CM | POA: Insufficient documentation

## 2022-09-28 DIAGNOSIS — Z1159 Encounter for screening for other viral diseases: Secondary | ICD-10-CM | POA: Insufficient documentation

## 2022-09-28 DIAGNOSIS — Z1321 Encounter for screening for nutritional disorder: Secondary | ICD-10-CM | POA: Insufficient documentation

## 2022-09-28 DIAGNOSIS — R251 Tremor, unspecified: Secondary | ICD-10-CM | POA: Diagnosis not present

## 2022-09-28 DIAGNOSIS — Z1329 Encounter for screening for other suspected endocrine disorder: Secondary | ICD-10-CM | POA: Insufficient documentation

## 2022-09-28 DIAGNOSIS — D582 Other hemoglobinopathies: Secondary | ICD-10-CM

## 2022-09-28 DIAGNOSIS — N170 Acute kidney failure with tubular necrosis: Secondary | ICD-10-CM

## 2022-09-28 DIAGNOSIS — R404 Transient alteration of awareness: Secondary | ICD-10-CM | POA: Diagnosis not present

## 2022-09-28 DIAGNOSIS — Z833 Family history of diabetes mellitus: Secondary | ICD-10-CM

## 2022-09-28 NOTE — Assessment & Plan Note (Signed)
Chronic, previously stable Use of CPAP; does not smoke Followed by hem Repeat CBC

## 2022-09-28 NOTE — Assessment & Plan Note (Signed)
Low risk screen Treatable, and curable. If left untreated Hep C can lead to cirrhosis and liver failure. Encourage routine testing; recommend repeat testing if risk factors change.  

## 2022-09-28 NOTE — Assessment & Plan Note (Signed)
Low risk screen ?Consented; encouraged to "know your status" ?Recommend repeat screen if risk factors change ? ?

## 2022-09-28 NOTE — Assessment & Plan Note (Signed)
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

## 2022-09-28 NOTE — Assessment & Plan Note (Signed)
Add thyroid screening given shaky feeling and lightheadedness

## 2022-09-28 NOTE — Assessment & Plan Note (Signed)
Noted in 3rd pregnancy; controlled with diet/exercise

## 2022-09-28 NOTE — Assessment & Plan Note (Signed)
Recommend Vit B/D series given muscle involvement in symptoms

## 2022-09-28 NOTE — Assessment & Plan Note (Signed)
Patient describes it as "subtle disorientation" however, reports s/s at store and was able to call family member for assistance

## 2022-09-28 NOTE — Assessment & Plan Note (Signed)
Reports flushing; slow warming Denies syncope Denies LOC Is post menopausal since hysterectomy

## 2022-09-28 NOTE — Assessment & Plan Note (Signed)
Chronic, previously controlled with diet/exericse Weight remains elevated Repeat LP Goal <100 given strong family hx

## 2022-09-28 NOTE — Assessment & Plan Note (Signed)
Reports symptoms similar to when she had GDM C/o lightheadedness, shaky, altered orientation; all c/f hypoglycemia Denies syncope/collapse

## 2022-09-28 NOTE — Assessment & Plan Note (Signed)
Hx of ATN requiring mg supplementation via IV following cancer treatment Repeat CMP and Mg levels

## 2022-09-28 NOTE — Assessment & Plan Note (Signed)
Both T1 and T2DM

## 2022-10-05 ENCOUNTER — Ambulatory Visit: Payer: Self-pay | Admitting: *Deleted

## 2022-10-05 ENCOUNTER — Telehealth: Payer: BC Managed Care – PPO | Admitting: Primary Care

## 2022-10-05 NOTE — Telephone Encounter (Signed)
Please advise 

## 2022-10-05 NOTE — Telephone Encounter (Signed)
Patient advised as below. She reports she is using a nasal spray.

## 2022-10-05 NOTE — Telephone Encounter (Signed)
Summary: requesting more antibiotics for congestion   Patient called in states the antibiotic she had previously helped some with her congestion, but now mucous backing up again and she is wanting another round of antibiotics. Please call back         Reason for Disposition . [1] Sinus congestion (pressure, fullness) AND [2] present > 10 days    Patient treated 10/27 and reports her symptoms improved- stared back after finishing antibiotic- requesting antibiotic  Answer Assessment - Initial Assessment Questions 1. LOCATION: "Where does it hurt?"      No pain 2. ONSET: "When did the sinus pain start?"  (e.g., hours, days)      No- pressure 3. SEVERITY: "How bad is the pain?"   (Scale 1-10; mild, moderate or severe)   - MILD (1-3): doesn't interfere with normal activities    - MODERATE (4-7): interferes with normal activities (e.g., work or school) or awakens from sleep   - SEVERE (8-10): excruciating pain and patient unable to do any normal activities        Pressure only 4. RECURRENT SYMPTOM: "Have you ever had sinus problems before?" If Yes, ask: "When was the last time?" and "What happened that time?"      Yes- finished antibiotic- about 5 ago 5. NASAL CONGESTION: "Is the nose blocked?" If Yes, ask: "Can you open it or must you breathe through your mouth?"     Blocked nasal passage- one side 6. NASAL DISCHARGE: "Do you have discharge from your nose?" If so ask, "What color?"     Clear- tinged with color- thick 7. FEVER: "Do you have a fever?" If Yes, ask: "What is it, how was it measured, and when did it start?"      no 8. OTHER SYMPTOMS: "Do you have any other symptoms?" (e.g., sore throat, cough, earache, difficulty breathing)     Slight cough  Protocols used: Sinus Pain or Congestion-A-AH

## 2022-10-05 NOTE — Telephone Encounter (Signed)
  Chief Complaint: medication request- symptoms returning Symptoms: congestion Frequency: patient did video visit and was treated with antibiotic 10/27. She states she finished the antibiotic and now has returning symptoms. 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 Pertinent Negatives: Patient denies fever, sore throat, cough, earache, difficulty breathing  Disposition: '[]'$ ED /'[]'$ Urgent Care (no appt availability in office) / '[]'$ Appointment(In office/virtual)/ '[]'$  [facility]/ '[]'$ Home Care/ '[x]'$ Refused Recommended Disposition /'[]'$ [facility]/ '[]'$  Follow-up with PCP Additional Notes: Patient declines appointment- requesting treatment with another round of antibiotics or different antibiotic. Patient advised that provider may not give Rx without her being seen.

## 2022-10-06 ENCOUNTER — Telehealth: Payer: Self-pay

## 2022-10-06 NOTE — Telephone Encounter (Signed)
ONO faxed to [facility] at [facility] office for review.

## 2022-10-10 NOTE — Telephone Encounter (Signed)
Yes I received them this morning, Joyce Decker 10/02/2022 on room air showed no significant oxygen desaturations.  Average basal O2 95% with SPO2 low 87%. Patient spent 14 seconds with SPO2 low less than 88%.  She does not need supplemental oxygen.

## 2022-10-10 NOTE — Telephone Encounter (Signed)
Lm for patient.  

## 2022-10-10 NOTE — Telephone Encounter (Signed)
Beth, please advise if you have received results? Thanks

## 2022-10-11 ENCOUNTER — Telehealth: Payer: BC Managed Care – PPO | Admitting: Physician Assistant

## 2022-10-11 DIAGNOSIS — J329 Chronic sinusitis, unspecified: Secondary | ICD-10-CM | POA: Diagnosis not present

## 2022-10-11 MED ORDER — DOXYCYCLINE HYCLATE 100 MG PO TABS: 100.0000 mg | ORAL_TABLET | Freq: Two times a day (BID) | ORAL | 0 refills | Status: DC

## 2022-10-11 NOTE — Patient Instructions (Signed)
[name], thank you for joining Leeanne Rio, PA-C for today's virtual visit.  While this provider is not your primary care provider (PCP), if your PCP is located in our provider database this encounter information will be shared with them immediately following your visit.   [place] account gives you access to today's visit and all your visits, tests, and labs performed at [facility] " click here if you don't have a [facility] account or go to [email or web address]  Consent: (Patient) [name] provided verbal consent for this virtual visit at the beginning of the encounter.  Current Medications:  Current Outpatient Medications:    diclofenac Sodium (VOLTAREN ARTHRITIS PAIN) 1 % GEL, Apply 4 g topically 4 (four) times daily., Disp: 150 g, Rfl: 11   pantoprazole (PROTONIX) 20 MG tablet, Take 20 mg by mouth daily as needed., Disp: , Rfl:    tretinoin (RETIN-A) 0.025 % cream, Apply topically at bedtime. Qhs to face, Disp: 45 g, Rfl: 6   Medications ordered in this encounter:  No orders of the defined types were placed in this encounter.    *If you need refills on other medications prior to your next appointment, please contact your pharmacy*  Follow-Up: Call back or seek an in-person evaluation if the symptoms worsen or if the condition fails to improve as anticipated.  [place] [phone]  Other Instructions Please take antibiotic as directed.  Increase fluid intake.  Use Saline nasal spray.  Take a daily multivitamin. Use the Flonase as directed.  Place a humidifier in the bedroom.  Please call or return clinic if symptoms are not improving.  Make sure to follow-up with your PCP to get the blood work they have ordered for you!  Sinusitis Sinusitis is redness, soreness, and swelling (inflammation) of the paranasal sinuses. Paranasal sinuses are air pockets within the bones of your face (beneath the eyes, the  middle of the forehead, or above the eyes). In healthy paranasal sinuses, mucus is able to drain out, and air is able to circulate through them by way of your nose. However, when your paranasal sinuses are inflamed, mucus and air can become trapped. This can allow bacteria and other germs to grow and cause infection. Sinusitis can develop quickly and last only a short time (acute) or continue over a long period (chronic). Sinusitis that lasts for more than 12 weeks is considered chronic.  CAUSES  Causes of sinusitis include: Allergies. Structural abnormalities, such as displacement of the cartilage that separates your nostrils (deviated septum), which can decrease the air flow through your nose and sinuses and affect sinus drainage. Functional abnormalities, such as when the small hairs (cilia) that line your sinuses and help remove mucus do not work properly or are not present. SYMPTOMS  Symptoms of acute and chronic sinusitis are the same. The primary symptoms are pain and pressure around the affected sinuses. Other symptoms include: Upper toothache. Earache. Headache. Bad breath. Decreased sense of smell and taste. A cough, which worsens when you are lying flat. Fatigue. Fever. Thick drainage from your nose, which often is green and may contain pus (purulent). Swelling and warmth over the affected sinuses. DIAGNOSIS  Your caregiver will perform a physical exam. During the exam, your caregiver may: Look in your nose for signs of abnormal growths in your nostrils (nasal polyps). Tap over the affected sinus to check for signs of infection. View the inside of your sinuses (endoscopy) with a special  imaging device with a light attached (endoscope), which is inserted into your sinuses. If your caregiver suspects that you have chronic sinusitis, one or more of the following tests may be recommended: Allergy tests. Nasal culture A sample of mucus is taken from your nose and sent to a lab and  screened for bacteria. Nasal cytology A sample of mucus is taken from your nose and examined by your caregiver to determine if your sinusitis is related to an allergy. TREATMENT  Most cases of acute sinusitis are related to a viral infection and will resolve on their own within 10 days. Sometimes medicines are prescribed to help relieve symptoms (pain medicine, decongestants, nasal steroid sprays, or saline sprays).  However, for sinusitis related to a bacterial infection, your caregiver will prescribe antibiotic medicines. These are medicines that will help kill the bacteria causing the infection.  Rarely, sinusitis is caused by a fungal infection. In theses cases, your caregiver will prescribe antifungal medicine. For some cases of chronic sinusitis, surgery is needed. Generally, these are cases in which sinusitis recurs more than 3 times per year, despite other treatments. HOME CARE INSTRUCTIONS  Drink plenty of water. Water helps thin the mucus so your sinuses can drain more easily. Use a humidifier. Inhale steam 3 to 4 times a day (for example, sit in the bathroom with the shower running). Apply a warm, moist washcloth to your face 3 to 4 times a day, or as directed by your caregiver. Use saline nasal sprays to help moisten and clean your sinuses. Take over-the-counter or prescription medicines for pain, discomfort, or fever only as directed by your caregiver. SEEK IMMEDIATE MEDICAL CARE IF: You have increasing pain or severe headaches. You have nausea, vomiting, or drowsiness. You have swelling around your face. You have vision problems. You have a stiff neck. You have difficulty breathing. MAKE SURE YOU:  Understand these instructions. Will watch your condition. Will get help right away if you are not doing well or get worse. Document Released: 11/14/2005 Document Revised: 02/06/2012 Document Reviewed: 11/29/2011 [facility] Patient Information 2014 [place], [place].    If you have  been instructed to have an in-person evaluation today at a local Urgent Care facility, please use the link below. It will take you to a list of all of our available [facility], including address, phone number and hours of operation. Please do not delay care.  [facility]  If you or a family member do not have a primary care provider, use the link below to schedule a visit and establish care. When you choose a [facility] primary care physician or advanced practice provider, you gain a long-term partner in health. Find a Primary Care Provider  Learn more about [facility]'s in-office and virtual care options: [facility]

## 2022-10-11 NOTE — Progress Notes (Signed)
Virtual Visit Consent   Joyce Decker, you are scheduled for a virtual visit with a [facility] provider today. Just as with appointments in the office, your consent must be obtained to participate. Your consent will be active for this visit and any virtual visit you may have with one of our providers in the next 365 days. If you have a MyChart account, a copy of this consent can be sent to you electronically.  As this is a virtual visit, video technology does not allow for your provider to perform a traditional examination. This may limit your provider's ability to fully assess your condition. If your provider identifies any concerns that need to be evaluated in person or the need to arrange testing (such as labs, EKG, etc.), we will make arrangements to do so. Although advances in technology are sophisticated, we cannot ensure that it will always work on either your end or our end. If the connection with a video visit is poor, the visit may have to be switched to a telephone visit. With either a video or telephone visit, we are not always able to ensure that we have a secure connection.  By engaging in this virtual visit, you consent to the provision of healthcare and authorize for your insurance to be billed (if applicable) for the services provided during this visit. Depending on your insurance coverage, you may receive a charge related to this service.  I need to obtain your verbal consent now. Are you willing to proceed with your visit today? [name] has provided verbal consent on 10/11/2022 for a virtual visit (video or telephone). Leeanne Rio, [place]  Date: 10/11/2022 6:36 PM  Virtual Visit via Video Note   I, Leeanne Rio, connected with  Joyce Decker  ([id number], 03/06/1956) on 10/11/22 at  6:30 PM EST by a video-enabled telemedicine application and verified that I am speaking with the correct person using two identifiers.  Location: Patient: Virtual Visit Location  Patient: Home Provider: Virtual Visit Location Provider: Home Office   I discussed the limitations of evaluation and management by telemedicine and the availability of in person appointments. The patient expressed understanding and agreed to proceed.    History of Present Illness: Joyce Decker is a 66 y.o. who identifies as a female who was assigned female at birth, and is being seen today for recurring sinus congestion, pressure now with facial pain after treatment for sinusitis about 2+ weeks ago with 10-day course of Augmentin. Much improved on antibiotic with mostly resolved symptoms by completion of course. Within 3 days symptoms were back and have been gradually worsening until now, with change in nasal discharge from clear to thick yellow/green and recurrence of sinus pain. Denies fever, chills, nausea or vomiting. Denies LH or dizziness at present. Denies chest congestion or SOB. Some cough due to drainage.  HPI: HPI  Problems:  Patient Active Problem List   Diagnosis Date Noted   ATN (acute tubular necrosis) ([place]) 09/28/2022   Hypoglycemia 09/28/2022   Transient alteration of awareness 09/28/2022   Shakiness 09/28/2022   Screening for thyroid disorder 09/28/2022   Encounter for screening for HIV 09/28/2022   Encounter for hepatitis C screening test for low risk patient 09/28/2022   Vasovagal symptom 09/28/2022   History of gestational diabetes 09/28/2022   Encounter for vitamin deficiency screening 09/28/2022   Abnormal hemoglobin ([place]) 05/13/2022   HLD (hyperlipidemia) 04/09/2015   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   Tape Other (See Comments)    Blisters   Gadobenate Nausea Only    PT became nauseous with contrast, but did not vomit.     Iodinated Contrast Media Nausea  Only   Medications:  Current Outpatient Medications:    doxycycline (VIBRA-TABS) 100 MG tablet, Take 1 tablet (100 mg total) by mouth 2 (two) times daily., Disp: 20 tablet, Rfl: 0   diclofenac Sodium (VOLTAREN ARTHRITIS PAIN) 1 % GEL, Apply 4 g topically 4 (four) times daily., Disp: 150 g, Rfl: 11   pantoprazole (PROTONIX) 20 MG tablet, Take 20 mg by mouth daily as needed., Disp: , Rfl:    tretinoin (RETIN-A) 0.025 % cream, Apply topically at bedtime. Qhs to face, Disp: 45 g, Rfl: 6  Observations/Objective: Patient is well-developed, well-nourished in no acute distress.  Resting comfortably at home.  Head is normocephalic, atraumatic.  No labored breathing. Speech is clear and coherent with logical content.  Patient is alert and oriented at baseline.   Assessment and Plan: 1. Recurrent sinusitis - doxycycline (VIBRA-TABS) 100 MG tablet; Take 1 tablet (100 mg total) by mouth 2 (two) times daily.  Dispense: 20 tablet; Refill: 0  Rx Doxycycline.  Increase fluids.  Rest.  Saline nasal spray.  Probiotic.  Mucinex as directed.  Humidifier in bedroom. Continue Flonase.  Call or return to clinic if symptoms are not improving.   Follow Up Instructions: I discussed the assessment and treatment plan with the patient. The patient was provided an opportunity to ask questions and all were answered. The patient agreed with the plan and demonstrated an understanding of the instructions.  A copy of instructions were sent to the patient via MyChart unless otherwise noted below.   The patient was advised to call back or seek an in-person evaluation if the symptoms worsen or if the condition fails to improve as anticipated.  Time:  I spent 10 minutes with the patient via telehealth technology discussing the above problems/concerns.    Leeanne Rio, PA-C

## 2022-10-11 NOTE — Telephone Encounter (Signed)
Lm x2 for patient.  Will close encounter per office protocol.  Nothing further needed.

## 2022-10-12 ENCOUNTER — Telehealth: Payer: Self-pay

## 2022-10-12 ENCOUNTER — Telehealth (INDEPENDENT_AMBULATORY_CARE_PROVIDER_SITE_OTHER): Payer: BC Managed Care – PPO | Admitting: Primary Care

## 2022-10-12 VITALS — Ht 65.0 in | Wt 165.0 lb

## 2022-10-12 DIAGNOSIS — J0101 Acute recurrent maxillary sinusitis: Secondary | ICD-10-CM

## 2022-10-12 DIAGNOSIS — Z6827 Body mass index (BMI) 27.0-27.9, adult: Secondary | ICD-10-CM | POA: Diagnosis not present

## 2022-10-12 DIAGNOSIS — G4733 Obstructive sleep apnea (adult) (pediatric): Secondary | ICD-10-CM | POA: Diagnosis not present

## 2022-10-12 MED ORDER — PREDNISONE 10 MG PO TABS: ORAL_TABLET | ORAL | 0 refills | Status: DC

## 2022-10-12 NOTE — Progress Notes (Signed)
Virtual Visit via Video Note  I connected with Joyce Decker on 10/12/22 at  3:00 PM EST by a video enabled telemedicine application and verified that I am speaking with the correct person using two identifiers.  Location: Patient: Home Provider: Office    I discussed the limitations of evaluation and management by telemedicine and the availability of in person appointments. The patient expressed understanding and agreed to proceed.  History of Present Illness: 66 year old female, former smoker.  Past medical history significant for sleep apnea, history of ovarian cancer, BRCA 1 gene mutation, hyperlipidemia, abnormal hemoglobin.  Previous [facility] pulmonary encounter: 08/16/2022 Patient presents today for sleep consult. Hgb elevated, she follows with oncology. Concern for underlying/uncontrolled OSA. She was diagnosed with sleep apnea in 2015 and was started on CPAP. She had no follow-up, she tells me her original sleep doctor left. She is currently using auto [employer]ips CPAP machine which was replaced last year. No SD card or download available. She reports compliance with CPAP use. DME is [facility].  Sleep questionnaire Sleep problem-diagnosed with polycythemia; patient has symptoms of snoring and daytime sleepiness Previous sleep study-[facility] December 12th 2015, AHI 25.9 an hour with spo2 low 81% (baseline 97%) Typical bedtime-930 to 10:30 PM How long to fall asleep-10 minutes with melatonin Nocturnal awakenings-none Out of bed/start of day-530/6 AM Do you operate heavy machinery-no Weight changes in last 2 years-up 25 pounds Do you currently wear CPAP-yes, unsure pressure settings Do you wear oxygen-no [place]-8   10/12/2022- interim hx  Patient contacted today to review ONO results. Patient was referred by hematology/oncology d.t elevated hemoglobin levels, concern OSA not controlled. She had sleep study in December 2015 that showed moderate obstructive sleep apnea, AHI 25.9/hour with spo2 low  81%. She is compliant with CPAP. ONO on CPAP 10/02/2022 showed no significant oxygen desaturations.  Average basal O2 95% with SPO2 low 87%. Patient spent 14 seconds with SPO2 low less than 88%.  She does not need supplemental oxygen. She is struggling with her weight, she has been on weight loss medication in the past but did not tolerate. She is interested in speaking with someone further about other options.   She develop sinus infection 2 weeks ago. She completed 10 day course of Augmentin but symptoms returned after completing. She is currently on doxycycline course. She is getting up green mucus from her sinuses. She has associated voice hoarsens. 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     Observations/Objective:  Appears well; No overt respiratory symptoms   Assessment and Plan:  OSA: - Well controlled, no evidence of nocturnal hypoxia on CPAP  - ONO on CPAP 10/02/2022 showed no significant oxygen desaturations.  Average basal O2 95% with SPO2 low 87%. Patient spent 14 seconds with SPO2 low less than 88%  Abnormal hemoglobin: - Elevated HGB not felt to be related to underlying OSA or hypoxemia, advised she return to see hem/onc  Acute recurrent sinusitis: - Currently on Doxycycline, sending in prednisone taper d/t voice hoarseness   BMI 27: - Referring to healthy weight and wellness   Follow Up Instructions:   1 year for CPAP follow-up  I discussed the assessment and treatment plan with the patient. The patient was provided an opportunity to ask questions and all were answered. The patient agreed with the plan and demonstrated an understanding of the instructions.   The patient was advised to call back or seek an in-person evaluation if the symptoms worsen or if the  condition fails to improve as anticipated.  I provided 22 minutes of non-face-to-face time during this encounter.   Martyn Ehrich, NP

## 2022-10-12 NOTE — Telephone Encounter (Signed)
Called patient to let her know she can go to any [facility] location for blood draw.

## 2022-10-12 NOTE — Telephone Encounter (Signed)
Copied from [place] [id number]. Topic: General - Inquiry >> Oct 12, 2022  8:29 AM Ludger Nutting wrote: Patient would like to know if the labs that were ordered on 09/28/22 can be drawn at an outside lab that has evening or weekend hours. Please follow up with patient.

## 2022-10-14 ENCOUNTER — Encounter: Payer: Self-pay | Admitting: Primary Care

## 2022-11-16 ENCOUNTER — Ambulatory Visit: Payer: BC Managed Care – PPO | Admitting: Primary Care

## 2022-12-19 ENCOUNTER — Telehealth: Payer: Self-pay

## 2022-12-19 NOTE — Telephone Encounter (Signed)
Copied from [place] [id number]. Topic: General - Other >> Dec 19, 2022  3:48 PM Everette C wrote: Reason for CRM: The patient has called to request imaging orders for a bone density scan   Please contact the patient further if/when possible

## 2022-12-20 NOTE — Telephone Encounter (Signed)
Put in patients note for upcoming appt. Patient can discuss at appt.  Joyce Decker

## 2023-01-05 ENCOUNTER — Ambulatory Visit: Payer: BC Managed Care – PPO | Admitting: Family Medicine

## 2023-01-05 ENCOUNTER — Other Ambulatory Visit: Payer: Self-pay | Admitting: Dermatology

## 2023-02-28 ENCOUNTER — Ambulatory Visit: Payer: BC Managed Care – PPO | Admitting: Family Medicine

## 2023-02-28 ENCOUNTER — Encounter: Payer: Self-pay | Admitting: Family Medicine

## 2023-02-28 VITALS — BP 141/93 | HR 68 | Temp 98.0°F | Resp 12 | Ht 62.0 in | Wt 179.8 lb

## 2023-02-28 DIAGNOSIS — Z1321 Encounter for screening for nutritional disorder: Secondary | ICD-10-CM

## 2023-02-28 DIAGNOSIS — Z833 Family history of diabetes mellitus: Secondary | ICD-10-CM

## 2023-02-28 DIAGNOSIS — E7800 Pure hypercholesterolemia, unspecified: Secondary | ICD-10-CM

## 2023-02-28 DIAGNOSIS — R251 Tremor, unspecified: Secondary | ICD-10-CM

## 2023-02-28 DIAGNOSIS — Z8632 Personal history of gestational diabetes: Secondary | ICD-10-CM

## 2023-02-28 DIAGNOSIS — E162 Hypoglycemia, unspecified: Secondary | ICD-10-CM

## 2023-02-28 DIAGNOSIS — R635 Abnormal weight gain: Secondary | ICD-10-CM | POA: Diagnosis not present

## 2023-02-28 DIAGNOSIS — Z1159 Encounter for screening for other viral diseases: Secondary | ICD-10-CM

## 2023-02-28 DIAGNOSIS — Z114 Encounter for screening for human immunodeficiency virus [HIV]: Secondary | ICD-10-CM

## 2023-02-28 MED ORDER — BUPROPION HCL ER (XL) 150 MG PO TB24: 150.0000 mg | ORAL_TABLET | Freq: Every day | ORAL | 0 refills | Status: DC

## 2023-02-28 NOTE — Assessment & Plan Note (Signed)
Continues to reports symptoms similar to when she had GDM C/o lightheadedness, shaky, altered orientation; has picked up ODT glucose tabs. Will repeat A1c at this time. Denies syncope/collapse

## 2023-02-28 NOTE — Assessment & Plan Note (Signed)
Chronic, previously elevated Repeat LP given weight gain  recommend diet low in saturated fat and regular exercise - 30 min at least 5 times per week

## 2023-02-28 NOTE — Assessment & Plan Note (Signed)
Acute on chronic, not improved with use of ODT glucose tabs Will repeat full set of labs given patient did not complete at prior OV

## 2023-02-28 NOTE — Assessment & Plan Note (Signed)
Recommend A1c at this time given personal hx of GDM as well as family hx of DM Continue to recommend balanced, lower carb meals. Smaller meal size, adding snacks. Choosing water as drink of choice and increasing purposeful exercise.

## 2023-02-28 NOTE — Assessment & Plan Note (Signed)
Significant weight gain in past 4 months in setting of multiple stressors and loss of step father

## 2023-02-28 NOTE — Assessment & Plan Note (Signed)
Low risk screen Treatable, and curable. If left untreated Hep C can lead to cirrhosis and liver failure. Encourage routine testing; recommend repeat testing if risk factors change.  

## 2023-02-28 NOTE — Assessment & Plan Note (Signed)
Recommend vitamin screening given acute weight gain following passing of patient's step father

## 2023-02-28 NOTE — Assessment & Plan Note (Signed)
Low risk screen ?Consented; encouraged to "know your status" ?Recommend repeat screen if risk factors change ? ?

## 2023-02-28 NOTE — Progress Notes (Signed)
I,[name],acting as a scribe for Joyce Sprout, FNP.,have documented all relevant documentation on the behalf of Joyce Sprout, FNP,as directed by  Joyce Sprout, FNP while in the presence of Joyce Sprout, FNP.   Established patient visit  Patient: Joyce Decker   DOB: January 24, 1956   67 y.o. Female  MRN: [id number] Visit Date: 02/28/2023  Today's healthcare provider: [name], FNP  Re Introduced to nurse practitioner role and practice setting.  All questions answered.  Discussed provider/patient relationship and expectations.  Chief Complaint  Patient presents with   Obesity   Subjective    HPI  Obesity: Patient complains of obesity. Patient cites health as reasons for wanting to lose weight.   History of Weight Loss Efforts Greatest amount of weight lost: 20 lb  Successful weight loss techniques attempted: prescription appetite suppressants:   Unsuccessful weight loss techniques attempted:  injectable   Current Exercise Habits walking  Wt Readings from Last 3 Encounters:  02/28/23 179 lb 12.8 oz (81.6 kg)  10/12/22 165 lb (74.8 kg)  08/16/22 176 lb (79.8 kg)     Medications: Outpatient Medications Prior to Visit  Medication Sig   diclofenac Sodium (VOLTAREN ARTHRITIS PAIN) 1 % GEL Apply 4 g topically 4 (four) times daily.   pantoprazole (PROTONIX) 20 MG tablet Take 20 mg by mouth daily as needed.   tretinoin (RETIN-A) 0.025 % cream Apply topically at bedtime. Qhs to face   valACYclovir (VALTREX) 1000 MG tablet TAKE 2 TABLETS A FIRST SIGN OF FLARE REPEAT IN 12 HOURS   [DISCONTINUED] predniSONE (DELTASONE) 10 MG tablet 4 tabs for 2 days, then 3 tabs for 2 days, 2 tabs for 2 days, then 1 tab for 2 days, then stop   doxycycline (VIBRA-TABS) 100 MG tablet Take 1 tablet (100 mg total) by mouth 2 (two) times daily.   No facility-administered medications prior to visit.   Review of Systems     Objective    BP (!) 141/93 (BP Location: Left Arm, Patient  Position: Sitting, Cuff Size: Normal)   Pulse 68   Temp 98 F (36.7 C) (Oral)   Resp 12   Ht 5\' 2"  (1.575 m)   Wt 179 lb 12.8 oz (81.6 kg)   SpO2 100%   BMI 32.89 kg/m    Physical Exam Vitals and nursing note reviewed.  Constitutional:      General: She is not in acute distress.    Appearance: Normal appearance. She is obese. She is not ill-appearing, toxic-appearing or diaphoretic.  HENT:     Head: Normocephalic and atraumatic.  Cardiovascular:     Rate and Rhythm: Normal rate and regular rhythm.     Pulses: Normal pulses.     Heart sounds: Normal heart sounds. No murmur heard.    No friction rub. No gallop.  Pulmonary:     Effort: Pulmonary effort is normal. No respiratory distress.     Breath sounds: Normal breath sounds. No stridor. No wheezing, rhonchi or rales.  Chest:     Chest wall: No tenderness.  Abdominal:     General: Bowel sounds are normal.     Palpations: Abdomen is soft.  Musculoskeletal:        General: No swelling, tenderness, deformity or signs of injury. Normal range of motion.     Right lower leg: No edema.     Left lower leg: No edema.  Skin:    General: Skin is warm and dry.  Capillary Refill: Capillary refill takes less than 2 seconds.     Coloration: Skin is not jaundiced or pale.     Findings: No bruising, erythema, lesion or rash.  Neurological:     General: No focal deficit present.     Mental Status: She is alert and oriented to person, place, and time. Mental status is at baseline.     Cranial Nerves: No cranial nerve deficit.     Sensory: No sensory deficit.     Motor: No weakness.     Coordination: Coordination normal.  Psychiatric:        Mood and Affect: Mood normal.        Behavior: Behavior normal.        Thought Content: Thought content normal.        Judgment: Judgment normal.     No results found for any visits on 02/28/23.  Assessment & Plan     Problem List Items Addressed This Visit       Endocrine    Hypoglycemia    Continues to reports symptoms similar to when she had GDM C/o lightheadedness, shaky, altered orientation; has picked up ODT glucose tabs. Will repeat A1c at this time. Denies syncope/collapse      Relevant Orders   Hemoglobin A1c     Other   Encounter for hepatitis C screening test for low risk patient    Low risk screen Treatable, and curable. If left untreated Hep C can lead to cirrhosis and liver failure. Encourage routine testing; recommend repeat testing if risk factors change.       Relevant Orders   Hepatitis C Antibody   Encounter for screening for HIV    Low risk screen Consented; encouraged to "know your status" Recommend repeat screen if risk factors change       Relevant Orders   HIV antibody (with reflex)   Encounter for vitamin deficiency screening    Recommend vitamin screening given acute weight gain following passing of patient's step father       Relevant Orders   Vitamin D (25 hydroxy)   Family history of diabetes mellitus    Recommend A1c at this time given personal hx of GDM as well as family hx of DM Continue to recommend balanced, lower carb meals. Smaller meal size, adding snacks. Choosing water as drink of choice and increasing purposeful exercise.       Relevant Orders   Hemoglobin A1c   History of gestational diabetes   Relevant Orders   Hemoglobin A1c   HLD (hyperlipidemia)    Chronic, previously elevated Repeat LP given weight gain  recommend diet low in saturated fat and regular exercise - 30 min at least 5 times per week       Relevant Orders   Lipid panel   Shakiness    Acute on chronic, not improved with use of ODT glucose tabs Will repeat full set of labs given patient did not complete at prior OV      Relevant Orders   CBC with Differential/Platelet   Comprehensive Metabolic Panel (CMET)   Weight gain - Primary    Significant weight gain in past 4 months in setting of multiple stressors and loss of step  father      Relevant Orders   CBC with Differential/Platelet   Comprehensive Metabolic Panel (CMET)   Hemoglobin A1c   TSH + free T4   Lipid panel   Vitamin D (25 hydroxy)   Return in about 2  months (around 04/30/2023), or if symptoms worsen or fail to improve, for anxiety and depression, weight mgmt.     Vonna Kotyk, FNP, have reviewed all documentation for this visit. The documentation on 02/28/23 for the exam, diagnosis, procedures, and orders are all accurate and complete.  Joyce Decker, [place] [phone] (phone) [phone] (fax)  [place]

## 2023-03-01 ENCOUNTER — Other Ambulatory Visit: Payer: Self-pay | Admitting: Family Medicine

## 2023-03-01 LAB — LIPID PANEL
Chol/HDL Ratio: 3.2 ratio (ref 0.0–4.4)
Cholesterol, Total: 195 mg/dL (ref 100–199)
HDL: 61 mg/dL (ref >39)
LDL Chol Calc (NIH): 116 mg/dL — ABNORMAL HIGH (ref 0–99)
Triglycerides: 103 mg/dL (ref 0–149)
VLDL Cholesterol Cal: 18 mg/dL (ref 5–40)

## 2023-03-01 LAB — CBC WITH DIFFERENTIAL/PLATELET
Basophils Absolute: 0.1 10*3/uL (ref 0.0–0.2)
Basos: 1 %
EOS (ABSOLUTE): 0.2 10*3/uL (ref 0.0–0.4)
Eos: 2 %
Hematocrit: 45.9 % (ref 34.0–46.6)
Hemoglobin: 15.3 g/dL (ref 11.1–15.9)
Immature Grans (Abs): 0 10*3/uL (ref 0.0–0.1)
Immature Granulocytes: 1 %
Lymphocytes Absolute: 3 10*3/uL (ref 0.7–3.1)
Lymphs: 38 %
MCH: 29 pg (ref 26.6–33.0)
MCHC: 33.3 g/dL (ref 31.5–35.7)
MCV: 87 fL (ref 79–97)
Monocytes Absolute: 0.7 10*3/uL (ref 0.1–0.9)
Monocytes: 9 %
Neutrophils Absolute: 4 10*3/uL (ref 1.4–7.0)
Neutrophils: 49 %
Platelets: 234 10*3/uL (ref 150–450)
RBC: 5.28 x10E6/uL (ref 3.77–5.28)
RDW: 12.8 % (ref 11.7–15.4)
WBC: 8 10*3/uL (ref 3.4–10.8)

## 2023-03-01 LAB — COMPREHENSIVE METABOLIC PANEL
ALT: 25 IU/L (ref 0–32)
AST: 18 IU/L (ref 0–40)
Albumin/Globulin Ratio: 1.9 (ref 1.2–2.2)
Albumin: 4.5 g/dL (ref 3.9–4.9)
Alkaline Phosphatase: 79 IU/L (ref 44–121)
BUN/Creatinine Ratio: 18 (ref 12–28)
BUN: 14 mg/dL (ref 8–27)
Bilirubin Total: 0.4 mg/dL (ref 0.0–1.2)
CO2: 23 mmol/L (ref 20–29)
Calcium: 9.7 mg/dL (ref 8.7–10.3)
Chloride: 102 mmol/L (ref 96–106)
Creatinine, Ser: 0.8 mg/dL (ref 0.57–1.00)
Globulin, Total: 2.4 g/dL (ref 1.5–4.5)
Glucose: 88 mg/dL (ref 70–99)
Potassium: 3.9 mmol/L (ref 3.5–5.2)
Sodium: 140 mmol/L (ref 134–144)
Total Protein: 6.9 g/dL (ref 6.0–8.5)
eGFR: 82 mL/min/{1.73_m2} (ref >59)

## 2023-03-01 LAB — VITAMIN D 25 HYDROXY (VIT D DEFICIENCY, FRACTURES): Vit D, 25-Hydroxy: 38.2 ng/mL (ref 30.0–100.0)

## 2023-03-01 LAB — HEMOGLOBIN A1C
Est. average glucose Bld gHb Est-mCnc: 123 mg/dL
Hgb A1c MFr Bld: 5.9 % — ABNORMAL HIGH (ref 4.8–5.6)

## 2023-03-01 LAB — TSH+FREE T4
Free T4: 1.21 ng/dL (ref 0.82–1.77)
TSH: 1.4 u[IU]/mL (ref 0.450–4.500)

## 2023-03-01 LAB — HIV ANTIBODY (ROUTINE TESTING W REFLEX): HIV Screen 4th Generation wRfx: NONREACTIVE

## 2023-03-01 LAB — HEPATITIS C ANTIBODY: Hep C Virus Ab: REACTIVE — AB

## 2023-03-01 MED ORDER — METFORMIN HCL ER 750 MG PO TB24: 750.0000 mg | ORAL_TABLET | Freq: Every day | ORAL | 11 refills | Status: DC

## 2023-03-01 MED ORDER — ROSUVASTATIN CALCIUM 10 MG PO TABS: 10.0000 mg | ORAL_TABLET | Freq: Every day | ORAL | 11 refills | Status: DC

## 2023-03-01 NOTE — Progress Notes (Signed)
Labs confirm return of pre-diabetes. Continue to recommend balanced, lower carb meals. Smaller meal size, adding snacks. Choosing water as drink of choice and increasing purposeful exercise. Recommend Metformin XR 750 to assist diet and exercise.  Cholesterol is elevated; I continue to recommend diet low in saturated fat and regular exercise - 30 min at least 5 times per week Recommend start of Crestor 10 mg to assist with current 11% risk of ASCVD event.  The 10-year ASCVD risk score (Arnett DK, et al., 2019) is: 10.6%   Values used to calculate the score:     Age: 67 years     Sex: Female     Is Non-Hispanic African American: No     Diabetic: No     Tobacco smoker: Yes     Systolic Blood Pressure: [id number] mmHg     Is BP treated: No     HDL Cholesterol: 61 mg/dL     Total Cholesterol: 195 mg/dL  Hep C was also reactive; will call [facility] to add on serologies to assist.

## 2023-03-02 ENCOUNTER — Other Ambulatory Visit: Payer: Self-pay

## 2023-03-02 DIAGNOSIS — K752 Nonspecific reactive hepatitis: Secondary | ICD-10-CM

## 2023-03-06 ENCOUNTER — Encounter: Payer: Self-pay | Admitting: Family Medicine

## 2023-03-06 ENCOUNTER — Telehealth: Payer: Self-pay

## 2023-03-06 LAB — HCV RNA BY PCR, QN RFX GENO: HCV Quant Baseline: NOT DETECTED IU/mL

## 2023-03-06 LAB — SPECIMEN STATUS REPORT

## 2023-03-06 NOTE — Telephone Encounter (Signed)
Received call from pt regarding hep c.  PT would like to know why more testing is needed. How will that impact her treatment? What will the testing show?  PT will not be taking the metformin or stain. She is sure she can control her pre-diabetes and cholesterol with diet and exercise.  PT would like to have another lab draw in 6 months to see how she is doing with controlling pre-diabetes and cholesterol issues.  PT is requesting a call back for provider regarding Hep C information.

## 2023-03-06 NOTE — Progress Notes (Signed)
Reactive Hep C antibody; however, no vial load. This is indicative that you were exposed and do not have active concerns. All reassuring.

## 2023-03-12 ENCOUNTER — Other Ambulatory Visit: Payer: Self-pay | Admitting: Family Medicine

## 2023-03-14 ENCOUNTER — Other Ambulatory Visit: Payer: Self-pay | Admitting: Dermatology

## 2023-03-14 DIAGNOSIS — L988 Other specified disorders of the skin and subcutaneous tissue: Secondary | ICD-10-CM

## 2023-03-14 NOTE — Telephone Encounter (Signed)
Requested Prescriptions  Pending Prescriptions Disp Refills   buPROPion (WELLBUTRIN XL) 150 MG 24 hr tablet [Pharmacy Med Name: BUPROPION HCL XL 150 MG TABLET] 90 tablet 0    Sig: TAKE 1 TABLET BY MOUTH EVERY DAY     Psychiatry: Antidepressants - bupropion Failed - 03/12/2023 10:31 PM      Failed - Last BP in normal range    BP Readings from Last 1 Encounters:  02/28/23 (!) 141/93         Passed - Cr in normal range and within 360 days    Creatinine, Ser  Date Value Ref Range Status  02/28/2023 0.80 0.57 - 1.00 mg/dL Final         Passed - AST in normal range and within 360 days    AST  Date Value Ref Range Status  02/28/2023 18 0 - 40 IU/L Final         Passed - ALT in normal range and within 360 days    ALT  Date Value Ref Range Status  02/28/2023 25 0 - 32 IU/L Final         Passed - Valid encounter within last 6 months    Recent Outpatient Visits           2 weeks ago Weight gain   [facility] Jacky Kindle, FNP   5 months ago Vasovagal symptom   [place] [facility] Merita Norton T, FNP   10 months ago Abnormal hemoglobin [facility])   [place] Merita Norton T, FNP   1 year ago Overweight with body mass index (BMI) 25.0-29.9   [place] [facility] Chrismon, Jodell Cipro, PA-C   2 years ago Chronic migraine without aura without status migrainosus, not intractable   [facility] [place], [place], [place]

## 2023-03-27 ENCOUNTER — Other Ambulatory Visit: Payer: Self-pay | Admitting: Dermatology

## 2023-03-27 ENCOUNTER — Other Ambulatory Visit: Payer: Self-pay

## 2023-03-27 DIAGNOSIS — L988 Other specified disorders of the skin and subcutaneous tissue: Secondary | ICD-10-CM

## 2023-03-27 MED ORDER — TRETINOIN 0.025 % EX CREA
TOPICAL_CREAM | Freq: Every day | CUTANEOUS | 0 refills | Status: DC
Start: 2023-03-27 — End: 2023-05-24

## 2023-03-27 NOTE — Telephone Encounter (Signed)
Pt called scheduled follow visit, request a enough Retin A until her appt may 28   Ok retin a 20 mg 0 RF sent to [facility]

## 2023-03-29 ENCOUNTER — Telehealth: Payer: Self-pay

## 2023-03-29 NOTE — Telephone Encounter (Signed)
Copied from CRM [id number]. Topic: General - Other >> Mar 29, 2023  4:02 PM Franchot Heidelberg wrote: Reason for CRM: Pt called reporting that she wants to discuss her bone density, has questions

## 2023-03-30 ENCOUNTER — Other Ambulatory Visit: Payer: Self-pay | Admitting: Family Medicine

## 2023-03-30 DIAGNOSIS — M810 Age-related osteoporosis without current pathological fracture: Secondary | ICD-10-CM

## 2023-03-30 NOTE — Telephone Encounter (Signed)
Patient would like a bone density study done. Patient with osteoporosis?

## 2023-03-31 ENCOUNTER — Telehealth: Payer: BC Managed Care – PPO | Admitting: Family Medicine

## 2023-03-31 ENCOUNTER — Ambulatory Visit: Payer: Self-pay

## 2023-03-31 DIAGNOSIS — J0100 Acute maxillary sinusitis, unspecified: Secondary | ICD-10-CM | POA: Diagnosis not present

## 2023-03-31 DIAGNOSIS — J329 Chronic sinusitis, unspecified: Secondary | ICD-10-CM

## 2023-03-31 MED ORDER — DOXYCYCLINE HYCLATE 100 MG PO TABS: 100.0000 mg | ORAL_TABLET | Freq: Two times a day (BID) | ORAL | 0 refills | Status: AC

## 2023-03-31 NOTE — Patient Instructions (Signed)

## 2023-03-31 NOTE — Telephone Encounter (Signed)
Pt called reporting potential sinus infections symptoms, she is seeking a call back to discuss what was prescribed to her last year that worked well.   Chief Complaint: Sinus pain, pain at eyes and forehead. Pain and pressure. Clear mucus, fatigue. Asking for Doxycycline to be called in "since I have so many of these infections." Declines OV or virtual visit. Please advise pt. Symptoms: Above Frequency: 5 days ago Pertinent Negatives: Patient denies  Disposition: [] ED /[] Urgent Care (no appt availability in office) / [] Appointment(In office/virtual)/ []  [facility] [facility]/ [] Home Care/ [] Refused Recommended Disposition /[] [facility]/ [x]  Follow-up with PCP Additional Notes: Please advise pt.  Answer Assessment - Initial Assessment Questions 1. LOCATION: "Where does it hurt?"      Eyes, forehead 2. ONSET: "When did the sinus pain start?"  (e.g., hours, days)      5 days ago 3. SEVERITY: "How bad is the pain?"   (Scale 1-10; mild, moderate or severe)   - MILD (1-3): doesn't interfere with normal activities    - MODERATE (4-7): interferes with normal activities (e.g., work or school) or awakens from sleep   - SEVERE (8-10): excruciating pain and patient unable to do any normal activities        Moderate 4. RECURRENT SYMPTOM: "Have you ever had sinus problems before?" If Yes, ask: "When was the last time?" and "What happened that time?"      Yes 5. NASAL CONGESTION: "Is the nose blocked?" If Yes, ask: "Can you open it or must you breathe through your mouth?"     Drainage 6. NASAL DISCHARGE: "Do you have discharge from your nose?" If so ask, "What color?"     Clear 7. FEVER: "Do you have a fever?" If Yes, ask: "What is it, how was it measured, and when did it start?"      No 8. OTHER SYMPTOMS: "Do you have any other symptoms?" (e.g., sore throat, cough, earache, difficulty breathing)     Fatigue 9. PREGNANCY: "Is there any chance you are pregnant?" "When was your last  menstrual period?"     No  Protocols used: Sinus Pain or Congestion-A-AH

## 2023-03-31 NOTE — Progress Notes (Signed)
Virtual Visit Consent   QUANA MCLANAHAN, you are scheduled for a virtual visit with a [facility] provider today. Just as with appointments in the office, your consent must be obtained to participate. Your consent will be active for this visit and any virtual visit you may have with one of our providers in the next 365 days. If you have a MyChart account, a copy of this consent can be sent to you electronically.  As this is a virtual visit, video technology does not allow for your provider to perform a traditional examination. This may limit your provider's ability to fully assess your condition. If your provider identifies any concerns that need to be evaluated in person or the need to arrange testing (such as labs, EKG, etc.), we will make arrangements to do so. Although advances in technology are sophisticated, we cannot ensure that it will always work on either your end or our end. If the connection with a video visit is poor, the visit may have to be switched to a telephone visit. With either a video or telephone visit, we are not always able to ensure that we have a secure connection.  By engaging in this virtual visit, you consent to the provision of healthcare and authorize for your insurance to be billed (if applicable) for the services provided during this visit. Depending on your insurance coverage, you may receive a charge related to this service.  I need to obtain your verbal consent now. Are you willing to proceed with your visit today? Renato Gails has provided verbal consent on 03/31/2023 for a virtual visit (video or telephone). Georgana Curio, FNP  Date: 03/31/2023 5:42 PM  Virtual Visit via Video Note   I, Georgana Curio, connected with  Joyce Decker  ([id number], 11-30-1955) on 03/31/23 at  5:45 PM EDT by a video-enabled telemedicine application and verified that I am speaking with the correct person using two identifiers.  Location: Patient: Virtual Visit Location Patient:  Home Provider: Virtual Visit Location Provider: Home Office   I discussed the limitations of evaluation and management by telemedicine and the availability of in person appointments. The patient expressed understanding and agreed to proceed.    History of Present Illness: Joyce Decker is a 66 y.o. who identifies as a female who was assigned female at birth, and is being seen today for left maxillary sinus pressure and pain. No fever. Swelling over left max sinus. Drainage. 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   Tape Other (See Comments)    Blisters   Gadobenate Nausea Only    PT became nauseous with contrast, but did not vomit.     Iodinated  Contrast Media Nausea Only   Medications:  Current Outpatient Medications:    metFORMIN (GLUCOPHAGE-XR) 750 MG 24 hr tablet, Take 1 tablet (750 mg total) by mouth daily with breakfast., Disp: 30 tablet, Rfl: 11   rosuvastatin (CRESTOR) 10 MG tablet, Take 1 tablet (10 mg total) by mouth daily., Disp: 30 tablet, Rfl: 11   buPROPion (WELLBUTRIN XL) 150 MG 24 hr tablet, TAKE 1 TABLET BY MOUTH EVERY DAY, Disp: 90 tablet, Rfl:  0   diclofenac Sodium (VOLTAREN ARTHRITIS PAIN) 1 % GEL, Apply 4 g topically 4 (four) times daily., Disp: 150 g, Rfl: 11   doxycycline (VIBRA-TABS) 100 MG tablet, Take 1 tablet (100 mg total) by mouth 2 (two) times daily., Disp: 20 tablet, Rfl: 0   pantoprazole (PROTONIX) 20 MG tablet, Take 20 mg by mouth daily as needed., Disp: , Rfl:    tretinoin (RETIN-A) 0.025 % cream, Apply topically at bedtime. Qhs to face, Disp: 20 g, Rfl: 0   valACYclovir (VALTREX) 1000 MG tablet, TAKE 2 TABLETS A FIRST SIGN OF FLARE REPEAT IN 12 HOURS, Disp: 30 tablet, Rfl: 2  Observations/Objective: Patient is well-developed, well-nourished in no acute distress.  Resting comfortably  at home.  Head is normocephalic, atraumatic.  No labored breathing.  Speech is clear and coherent with logical content.  Patient is alert and oriented at baseline.    Assessment and Plan: 1. Acute maxillary sinusitis, recurrence not specified  Increase fluids, humidifier at night, mucinex, flonase, UC if sx worsen.   Follow Up Instructions: I discussed the assessment and treatment plan with the patient. The patient was provided an opportunity to ask questions and all were answered. The patient agreed with the plan and demonstrated an understanding of the instructions.  A copy of instructions were sent to the patient via MyChart unless otherwise noted below.     The patient was advised to call back or seek an in-person evaluation if the symptoms worsen or if the condition fails to improve as anticipated.  Time:  I spent 10 minutes with the patient via telehealth technology discussing the above problems/concerns.    Georgana Curio, FNP

## 2023-04-03 ENCOUNTER — Other Ambulatory Visit: Payer: Self-pay

## 2023-04-03 NOTE — Telephone Encounter (Signed)
Patient was seen virtually through [facility]

## 2023-04-05 NOTE — Telephone Encounter (Signed)
Left message advising patient to call [facility] to schedule appt for bone density scan. Phone number provided.

## 2023-04-07 ENCOUNTER — Ambulatory Visit: Payer: Self-pay | Admitting: *Deleted

## 2023-04-07 NOTE — Telephone Encounter (Signed)
  Chief Complaint: Patient is requesting Joyce Decker feels she has hit plateau and is not losing weight. Symptoms: 11 lbs weight loss- unable to continue to lose  Disposition: [] ED /[] Urgent Care (no appt availability in office) / [] Appointment(In office/virtual)/ []  [place] [place] [facility]/ [] Home Care/ [] Refused Recommended Disposition /[] [place] [facility]/ [x]  Follow-up with PCP Additional Notes: Patient advised may need appointment to discuss starting a new medication- she did not start Metformin or rosuvastatin recently.

## 2023-04-07 NOTE — Telephone Encounter (Signed)
Summary: Pt requests Rx for weight loss medication   Pt stated she has been seeing Merita Norton regarding weight loss and she would like to request a Rx for a GLP1 weight loss medication. Pt also stated that she is pre-diabetic and would prefer to be prescribed Wegovy. Cb# [phone]     Reason for Disposition . Prescription request for new medicine (not a refill)  Answer Assessment - Initial Assessment Questions 1. NAME of MEDICINE: "What medicine(s) are you calling about?"     Wegovy  requested- patient states she has lost 11 lb but has hit a plateau  2. QUESTION: "What is your question?" (e.g., double dose of medicine, side effect)     Patient states she did not start the metformin or rosuvastatin- she was doing well with her exercise. Diet and was losing weight. Patient feels she has hit a plateau and needs some help. 3. PRESCRIBER: "Who prescribed the medicine?" Reason: if prescribed by specialist, call should be referred to that group.     PCP Patient advised her prescriber may want to discuss medication request- patient states that would be fine- she may want to do labs to see if recent weight loss has helped numbers.  Protocols used: Medication Question Call-A-AH

## 2023-04-11 NOTE — Telephone Encounter (Signed)
Called, spoke with patient about message from provider regarding insurance coverage for weight loss medications. Patient verbal understanding and states she will contact her provider once she reaches out to her insurance.

## 2023-04-20 ENCOUNTER — Telehealth: Payer: Self-pay | Admitting: Family Medicine

## 2023-04-20 ENCOUNTER — Other Ambulatory Visit: Payer: Self-pay | Admitting: Family Medicine

## 2023-04-20 MED ORDER — SEMAGLUTIDE(0.25 OR 0.5MG/DOS) 2 MG/1.5ML ~~LOC~~ SOPN: 0.2500 mg | PEN_INJECTOR | SUBCUTANEOUS | 0 refills | Status: DC

## 2023-04-20 NOTE — Telephone Encounter (Signed)
Pt is calling back to follow up that she has been reading side effect of Mounjaro based on GI systems and family hx of colon cancer. Pt would only like to be considered for ozempic at this time.

## 2023-04-20 NOTE — Telephone Encounter (Signed)
Pt is calling in because [facility] is not covered by pt's insurance but they told her that Ozempic could be covered if it was related to diabetes or pre-diabetes. Pt says she was told if Robynn Pane prescribed Ozempic it would need a pre-authorization. Pt says she cannot afford WeGovy and would like to try for Ozempic. Please advise.

## 2023-04-20 NOTE — Telephone Encounter (Signed)
Patient called back and stated Joyce Decker is also covered by her insurance and will help her blood sugar levels as per her research. Patient states she is  a Runner, broadcasting/film/video therefore  if you call prior to 3pm leave a detail message but if you call after 3pm she will be available.

## 2023-04-21 ENCOUNTER — Telehealth: Payer: Self-pay | Admitting: Family Medicine

## 2023-04-21 NOTE — Telephone Encounter (Signed)
Covermymeds requesting prior authorization Key: [id number] Name:[place] Ozempic 0.25 or 0.5 mg/dose 2mg /22ml pen injectors

## 2023-04-25 ENCOUNTER — Ambulatory Visit: Payer: BC Managed Care – PPO | Admitting: Dermatology

## 2023-04-25 VITALS — BP 124/80 | HR 73

## 2023-04-25 DIAGNOSIS — L720 Epidermal cyst: Secondary | ICD-10-CM | POA: Diagnosis not present

## 2023-04-25 DIAGNOSIS — L988 Other specified disorders of the skin and subcutaneous tissue: Secondary | ICD-10-CM

## 2023-04-25 DIAGNOSIS — L820 Inflamed seborrheic keratosis: Secondary | ICD-10-CM | POA: Diagnosis not present

## 2023-04-25 MED ORDER — TRETINOIN 0.05 % EX CREA
TOPICAL_CREAM | Freq: Every day | CUTANEOUS | 6 refills | Status: DC
Start: 2023-04-25 — End: 2024-04-01

## 2023-04-25 NOTE — Progress Notes (Unsigned)
Follow-Up Visit   Subjective  Joyce Decker is a 67 y.o. female who presents for the following: patient here today to discuss refills of retin a. She also reports some spots at face she would like checked.   The following portions of the chart were reviewed this encounter and updated as appropriate: medications, allergies, medical history  Review of Systems:  No other skin or systemic complaints except as noted in HPI or Assessment and Plan.  Objective  Well appearing patient in no apparent distress; mood and affect are within normal limits.   A focused examination was performed of the following areas: face  Relevant exam findings are noted in the Assessment and Plan.  Right Upper Eyelid x 1 Erythematous stuck-on, waxy papule or plaque    Assessment & Plan   MILIA Exam: tiny erythematous firm white papule at left nasolabial fold  Discussed this is a type of cyst. Benign-appearing. Sometimes these will clear with OTC adapalene/Differin 0.1% cream QHS.  Treatment Plan: Symptomatic, irritating, patient would like treated. Advised may not be covered by insurance.  Procedure risks and benefits were discussed with the patient and verbal consent was obtained. Following prep of the skin on the right oral commissure with an alcohol swab, area was injected with 1% lidocaine/epinephrine, extraction of milia was performed with cotton tip applicators following superficial incision made over their surfaces with a #11 surgical blade. Capillary hemostasis was achieved with 20% aluminum chloride solution. Vaseline ointment was applied to each site. The patient tolerated the procedure well.  Increase Retin A 0.025 % cream to 0.05 % cream  Start Retin A 0.05 % cream - apply pea sized amount to face qhs.   Inflamed seborrheic keratosis Right Upper Eyelid x 1  Symptomatic, irritating, patient would like treated.  Destruction of lesion - Right Upper Eyelid x 1  Destruction method:  cryotherapy   Informed consent: discussed and consent obtained   Lesion destroyed using liquid nitrogen: Yes   Region frozen until ice ball extended beyond lesion: Yes   Outcome: patient tolerated procedure well with no complications   Post-procedure details: wound care instructions given   Additional details:  Prior to procedure, discussed risks of blister formation, small wound, skin dyspigmentation, or rare scar following cryotherapy. Recommend Vaseline ointment to treated areas while healing.   Elastosis of skin  Related Medications tretinoin (RETIN-A) 0.025 % cream Apply topically at bedtime. Qhs to face  tretinoin (RETIN-A) 0.05 % cream Apply topically at bedtime.   FACIAL ELASTOSIS Exam: Rhytides and volume loss.  Treatment Plan:  Increase Retin A 0.025 % cream to 0.05 % cream  Start Retin A 0.05 % cream - apply pea sized amount to face qhs.   Topical retinoid medications like tretinoin/Retin-A, adapalene/Differin, tazarotene/Fabior, and Epiduo/Epiduo Forte can cause dryness and irritation when first started. Only apply a pea-sized amount to the entire affected area. Avoid applying it around the eyes, edges of mouth and creases at the nose. If you experience irritation, use a good moisturizer first and/or apply the medicine less often. If you are doing well with the medicine, you can increase how often you use it until you are applying every night. Be careful with sun protection while using this medication as it can make you sensitive to the sun. This medicine should not be used by pregnant women.    Recommend daily broad spectrum sunscreen SPF 30+ to sun-exposed areas, reapply every 2 hours as needed. Call for new or changing lesions.  Staying  in the shade or wearing long sleeves, sun glasses (UVA+UVB protection) and wide brim hats (4-inch brim around the entire circumference of the hat) are also recommended for sun protection.     Return if symptoms worsen or fail to  improve.  I, Asher Muir, CMA, am acting as scribe for Willeen Niece, MD.   Documentation: I have reviewed the above documentation for accuracy and completeness, and I agree with the above.  Willeen Niece, MD

## 2023-04-25 NOTE — Patient Instructions (Addendum)
Topical retinoid medications like tretinoin/Retin-A, adapalene/Differin, tazarotene/Fabior, and Epiduo/Epiduo Forte can cause dryness and irritation when first started. Only apply a pea-sized amount to the entire affected area. Avoid applying it around the eyes, edges of mouth and creases at the nose. If you experience irritation, use a good moisturizer first and/or apply the medicine less often. If you are doing well with the medicine, you can increase how often you use it until you are applying every night. Be careful with sun protection while using this medication as it can make you sensitive to the sun. This medicine should not be used by pregnant women.         Cryotherapy Aftercare  Wash gently with soap and water everyday.   Apply Vaseline and Band-Aid daily until healed.     Seborrheic Keratosis  What causes seborrheic keratoses? Seborrheic keratoses are harmless, common skin growths that first appear during adult life.  As time goes by, more growths appear.  Some people may develop a large number of them.  Seborrheic keratoses appear on both covered and uncovered body parts.  They are not caused by sunlight.  The tendency to develop seborrheic keratoses can be inherited.  They vary in color from skin-colored to gray, brown, or even black.  They can be either smooth or have a rough, warty surface.   Seborrheic keratoses are superficial and look as if they were stuck on the skin.  Under the microscope this type of keratosis looks like layers upon layers of skin.  That is why at times the top layer may seem to fall off, but the rest of the growth remains and re-grows.    Treatment Seborrheic keratoses do not need to be treated, but can easily be removed in the office.  Seborrheic keratoses often cause symptoms when they rub on clothing or jewelry.  Lesions can be in the way of shaving.  If they become inflamed, they can cause itching, soreness, or burning.  Removal of a seborrheic  keratosis can be accomplished by freezing, burning, or surgery. If any spot bleeds, scabs, or grows rapidly, please return to have it checked, as these can be an indication of a skin cancer.      Due to recent changes in healthcare laws, you may see results of your pathology and/or laboratory studies on MyChart before the doctors have had a chance to review them. We understand that in some cases there may be results that are confusing or concerning to you. Please understand that not all results are received at the same time and often the doctors may need to interpret multiple results in order to provide you with the best plan of care or course of treatment. Therefore, we ask that you please give [place] 2 business days to thoroughly review all your results before contacting the office for clarification. Should we see a critical lab result, you will be contacted sooner.   If You Need Anything After Your Visit  If you have any questions or concerns for your doctor, please call our main line at [phone] and press option 4 to reach your doctor's medical assistant. If no one answers, please leave a voicemail as directed and we will return your call as soon as possible. Messages left after 4 pm will be answered the following business day.   You may also send [place] a message via MyChart. We typically respond to MyChart messages within 1-2 business days.  For prescription refills, please ask your pharmacy to contact our office.  Our fax number is [phone].  If you have an urgent issue when the clinic is closed that cannot wait until the next business day, you can page your doctor at the number below.    Please note that while we do our best to be available for urgent issues outside of office hours, we are not available 24/7.   If you have an urgent issue and are unable to reach [place], you may choose to seek medical care at your doctor's office, retail clinic, urgent care center, or emergency room.  If  you have a medical emergency, please immediately call 911 or go to the emergency department.  Pager Numbers  - Dr. Gwen Pounds: [phone]  - Dr. Neale Burly: [phone]  - Dr. Roseanne Reno: [phone]  In the event of inclement weather, please call our main line at [phone] for an update on the status of any delays or closures.  Dermatology Medication Tips: Please keep the boxes that topical medications come in in order to help keep track of the instructions about where and how to use these. Pharmacies typically print the medication instructions only on the boxes and not directly on the medication tubes.   If your medication is too expensive, please contact our office at [phone] option 4 or send [place] a message through MyChart.   We are unable to tell what your co-pay for medications will be in advance as this is different depending on your insurance coverage. However, we may be able to find a substitute medication at lower cost or fill out paperwork to get insurance to cover a needed medication.   If a prior authorization is required to get your medication covered by your insurance company, please allow [place] 1-2 business days to complete this process.  Drug prices often vary depending on where the prescription is filled and some pharmacies may offer cheaper prices.  The website www.goodrx.com contains coupons for medications through different pharmacies. The prices here do not account for what the cost may be with help from insurance (it may be cheaper with your insurance), but the website can give you the price if you did not use any insurance.  - You can print the associated coupon and take it with your prescription to the pharmacy.  - You may also stop by our office during regular business hours and pick up a GoodRx coupon card.  - If you need your prescription sent electronically to a different pharmacy, notify our office through [facility] or by phone at [phone] option  4.     Si Usted Necesita Algo Despus de Su Visita  Tambin puede enviarnos un mensaje a travs de Clinical cytogeneticist. Por lo general respondemos a los mensajes de MyChart en el transcurso de 1 a 2 das hbiles.  Para renovar recetas, por favor pida a su farmacia que se ponga en contacto con nuestra oficina. Annie Sable de fax es [place] [phone].  Si tiene un asunto urgente cuando la clnica est cerrada y que no puede esperar hasta el siguiente da hbil, puede llamar/localizar a su doctor(a) al nmero que aparece a continuacin.   Por favor, tenga en cuenta que aunque hacemos todo lo posible para estar disponibles para asuntos urgentes fuera del horario de [place], no estamos disponibles las 24 horas del da, los 7 [address]  [address] de la [place].   Si tiene un problema urgente y no puede comunicarse con nosotros, puede optar por buscar atencin mdica  en el consultorio de su doctor(a), en una clnica privada, en un  centro de atencin urgente o en una sala de emergencias.  Si tiene Engineer, drilling, por favor llame inmediatamente al 911 o vaya a la sala de emergencias.  Nmeros de bper  - Dr. Gwen Pounds: [phone]  - Dra. Moye: [phone]  - Dra. Roseanne Reno: [phone]  En caso de inclemencias del [place], por favor llame a Lacy Duverney principal al [phone] para una actualizacin sobre el [place] de cualquier retraso o cierre.  Consejos para la medicacin en dermatologa: Por favor, guarde las cajas en las que vienen los medicamentos de uso tpico para ayudarle a seguir las instrucciones sobre dnde y cmo usarlos. Las farmacias generalmente imprimen las instrucciones del medicamento slo en las cajas y no directamente en los tubos del [place].   Si su medicamento es muy caro, por favor, pngase en contacto con Rolm Gala llamando al [phone] y presione la opcin 4 o envenos un mensaje a travs de Clinical cytogeneticist.   No podemos decirle cul ser su copago por los medicamentos por  adelantado ya que esto es diferente dependiendo de la cobertura de su seguro. Sin embargo, es posible que podamos encontrar un medicamento sustituto a Audiological scientist un formulario para que el seguro cubra el medicamento que se considera necesario.   Si se requiere una autorizacin previa para que su compaa de seguros [place] su medicamento, por favor permtanos de 1 a 2 das hbiles para completar [address]  Los precios de los medicamentos varan con frecuencia dependiendo del Environmental consultant de dnde se surte la receta y alguna farmacias pueden ofrecer precios ms baratos.  El sitio web www.goodrx.com tiene cupones para medicamentos de Health and safety inspector. Los precios aqu no tienen en cuenta lo que podra costar con la ayuda del seguro (puede ser ms barato con su seguro), pero el sitio web puede darle el precio si no utiliz Tourist information centre manager.  - Puede imprimir el cupn correspondiente y llevarlo con su receta a la farmacia.  - Tambin puede pasar por nuestra oficina durante el horario de atencin regular y Education officer, museum una tarjeta de cupones de GoodRx.  - Si necesita que su receta se enve electrnicamente a una farmacia diferente, informe a nuestra oficina a travs de MyChart de [place] o por telfono llamando al [phone] y presione la opcin 4.

## 2023-04-26 ENCOUNTER — Other Ambulatory Visit: Payer: Self-pay | Admitting: Family Medicine

## 2023-04-26 ENCOUNTER — Telehealth: Payer: Self-pay

## 2023-04-26 DIAGNOSIS — R251 Tremor, unspecified: Secondary | ICD-10-CM

## 2023-04-26 DIAGNOSIS — Z1329 Encounter for screening for other suspected endocrine disorder: Secondary | ICD-10-CM

## 2023-04-26 NOTE — Telephone Encounter (Signed)
Pt is calling in to check on her PA for her medication. Per pt insurance company they still have not received the PA and is still waiting on more information regarding pt diagnosis as to why she is needing this medication.   Please advise.

## 2023-04-26 NOTE — Telephone Encounter (Signed)
Copied from CRM [id number]. Topic: General - Other >> Apr 26, 2023  4:23 PM Dominique A wrote: Reason for CRM: Pt is calling to see if she is able to get some Ozempic samples until her PA goes through for her medication. Please advise.

## 2023-04-27 NOTE — Telephone Encounter (Signed)
Ozempic (0.25 or 0.5 MG/DOSE) 2MG /3ML pen-injectors Status: PA RequestCreated: May 23rd, [phone]: May 30th, 2024

## 2023-04-28 NOTE — Telephone Encounter (Signed)
Status: PA Response - Denied

## 2023-04-29 ENCOUNTER — Encounter: Payer: Self-pay | Admitting: Family Medicine

## 2023-05-01 NOTE — Telephone Encounter (Signed)
Pt is calling to ask if the nurse has sent additional information requested by [employer] for the PA on ozempic. Please advise CB- [phone]

## 2023-05-05 ENCOUNTER — Telehealth: Payer: Self-pay | Admitting: Family Medicine

## 2023-05-05 NOTE — Telephone Encounter (Signed)
Left detailed message advising as below.  Jacky Kindle, FNP  to Renato Gails      05/02/23  4:47 PM I received communication last week that the medication was denied as you have prediabetes and not Type 2 diabetes based on A1c.  This MyChart message has not been read.

## 2023-05-05 NOTE — Telephone Encounter (Signed)
Pt states that her insurance company denied her medication and will not cover it for pre diabetes, it will only cover it for diabetes type II.   Her understanding the body mass index has to be classified as over weight and she is needing to know her numbers for her body mass index to work with her insurance company to get her medication. Per pt could you please put this information in a mychart message.  Semaglutide,0.25 or 0.5MG /DOS, 2 MG/1.5ML SOPN

## 2023-05-12 ENCOUNTER — Inpatient Hospital Stay: Payer: BC Managed Care – PPO | Attending: Obstetrics and Gynecology

## 2023-05-12 DIAGNOSIS — Z803 Family history of malignant neoplasm of breast: Secondary | ICD-10-CM | POA: Insufficient documentation

## 2023-05-12 DIAGNOSIS — Z87891 Personal history of nicotine dependence: Secondary | ICD-10-CM | POA: Insufficient documentation

## 2023-05-12 DIAGNOSIS — D751 Secondary polycythemia: Secondary | ICD-10-CM | POA: Insufficient documentation

## 2023-05-12 DIAGNOSIS — Z800 Family history of malignant neoplasm of digestive organs: Secondary | ICD-10-CM | POA: Insufficient documentation

## 2023-05-12 DIAGNOSIS — Z1501 Genetic susceptibility to malignant neoplasm of breast: Secondary | ICD-10-CM | POA: Insufficient documentation

## 2023-05-12 DIAGNOSIS — Z808 Family history of malignant neoplasm of other organs or systems: Secondary | ICD-10-CM | POA: Insufficient documentation

## 2023-05-12 DIAGNOSIS — Z9221 Personal history of antineoplastic chemotherapy: Secondary | ICD-10-CM | POA: Insufficient documentation

## 2023-05-12 DIAGNOSIS — G4730 Sleep apnea, unspecified: Secondary | ICD-10-CM | POA: Insufficient documentation

## 2023-05-12 DIAGNOSIS — Z8543 Personal history of malignant neoplasm of ovary: Secondary | ICD-10-CM | POA: Insufficient documentation

## 2023-05-15 ENCOUNTER — Inpatient Hospital Stay: Payer: BC Managed Care – PPO | Admitting: Oncology

## 2023-05-19 ENCOUNTER — Other Ambulatory Visit: Payer: Self-pay | Admitting: *Deleted

## 2023-05-19 ENCOUNTER — Inpatient Hospital Stay: Payer: BC Managed Care – PPO

## 2023-05-19 DIAGNOSIS — Z803 Family history of malignant neoplasm of breast: Secondary | ICD-10-CM | POA: Diagnosis not present

## 2023-05-19 DIAGNOSIS — Z8543 Personal history of malignant neoplasm of ovary: Secondary | ICD-10-CM | POA: Diagnosis present

## 2023-05-19 DIAGNOSIS — Z9221 Personal history of antineoplastic chemotherapy: Secondary | ICD-10-CM | POA: Diagnosis not present

## 2023-05-19 DIAGNOSIS — Z1501 Genetic susceptibility to malignant neoplasm of breast: Secondary | ICD-10-CM | POA: Diagnosis present

## 2023-05-19 DIAGNOSIS — Z87891 Personal history of nicotine dependence: Secondary | ICD-10-CM | POA: Diagnosis not present

## 2023-05-19 DIAGNOSIS — D751 Secondary polycythemia: Secondary | ICD-10-CM | POA: Diagnosis not present

## 2023-05-19 DIAGNOSIS — G4730 Sleep apnea, unspecified: Secondary | ICD-10-CM | POA: Diagnosis not present

## 2023-05-19 DIAGNOSIS — Z808 Family history of malignant neoplasm of other organs or systems: Secondary | ICD-10-CM | POA: Diagnosis not present

## 2023-05-19 DIAGNOSIS — Z800 Family history of malignant neoplasm of digestive organs: Secondary | ICD-10-CM | POA: Diagnosis not present

## 2023-05-19 LAB — CMP (CANCER CENTER ONLY)
ALT: 24 U/L (ref 0–44)
AST: 23 U/L (ref 15–41)
Albumin: 4.6 g/dL (ref 3.5–5.0)
Alkaline Phosphatase: 69 U/L (ref 38–126)
Anion gap: 11 (ref 5–15)
BUN: 16 mg/dL (ref 8–23)
CO2: 26 mmol/L (ref 22–32)
Calcium: 9.9 mg/dL (ref 8.9–10.3)
Chloride: 102 mmol/L (ref 98–111)
Creatinine: 0.87 mg/dL (ref 0.44–1.00)
GFR, Estimated: >60 mL/min (ref >60)
Glucose, Bld: 97 mg/dL (ref 70–99)
Potassium: 3.7 mmol/L (ref 3.5–5.1)
Sodium: 139 mmol/L (ref 135–145)
Total Bilirubin: 1.1 mg/dL (ref 0.3–1.2)
Total Protein: 7.8 g/dL (ref 6.5–8.1)

## 2023-05-19 LAB — CBC WITH DIFFERENTIAL (CANCER CENTER ONLY)
Abs Immature Granulocytes: 0.03 10*3/uL (ref 0.00–0.07)
Basophils Absolute: 0.1 10*3/uL (ref 0.0–0.1)
Basophils Relative: 1 %
Eosinophils Absolute: 0.2 10*3/uL (ref 0.0–0.5)
Eosinophils Relative: 2 %
HCT: 45.9 % (ref 36.0–46.0)
Hemoglobin: 15.6 g/dL — ABNORMAL HIGH (ref 12.0–15.0)
Immature Granulocytes: 0 %
Lymphocytes Relative: 32 %
Lymphs Abs: 2.7 10*3/uL (ref 0.7–4.0)
MCH: 29.3 pg (ref 26.0–34.0)
MCHC: 34 g/dL (ref 30.0–36.0)
MCV: 86.1 fL (ref 80.0–100.0)
Monocytes Absolute: 0.8 10*3/uL (ref 0.1–1.0)
Monocytes Relative: 9 %
Neutro Abs: 4.6 10*3/uL (ref 1.7–7.7)
Neutrophils Relative %: 56 %
Platelet Count: 239 10*3/uL (ref 150–400)
RBC: 5.33 MIL/uL — ABNORMAL HIGH (ref 3.87–5.11)
RDW: 12.8 % (ref 11.5–15.5)
WBC Count: 8.3 10*3/uL (ref 4.0–10.5)
nRBC: 0 % (ref 0.0–0.2)

## 2023-05-21 LAB — CA 125: Cancer Antigen (CA) 125: 7.6 U/mL (ref 0.0–38.1)

## 2023-05-23 ENCOUNTER — Ambulatory Visit: Payer: BC Managed Care – PPO | Admitting: Oncology

## 2023-05-24 ENCOUNTER — Encounter: Payer: Self-pay | Admitting: Oncology

## 2023-05-24 ENCOUNTER — Inpatient Hospital Stay: Payer: BC Managed Care – PPO | Admitting: Oncology

## 2023-05-24 VITALS — BP 104/92 | HR 81 | Temp 98.2°F | Resp 18 | Wt 168.6 lb

## 2023-05-24 DIAGNOSIS — Z8543 Personal history of malignant neoplasm of ovary: Secondary | ICD-10-CM | POA: Diagnosis not present

## 2023-05-24 DIAGNOSIS — D751 Secondary polycythemia: Secondary | ICD-10-CM

## 2023-05-24 DIAGNOSIS — Z1502 Genetic susceptibility to malignant neoplasm of ovary: Secondary | ICD-10-CM | POA: Diagnosis not present

## 2023-05-24 DIAGNOSIS — Z1501 Genetic susceptibility to malignant neoplasm of breast: Secondary | ICD-10-CM

## 2023-05-24 DIAGNOSIS — Z1509 Genetic susceptibility to other malignant neoplasm: Secondary | ICD-10-CM

## 2023-05-24 NOTE — Progress Notes (Unsigned)
Pt here for follow up. Pt concerned about increased RBC and hemoglobin.Per pt, she had sleep study and it was ruled out that CPAP machine could be causing it.

## 2023-05-24 NOTE — Progress Notes (Unsigned)
Patient is a 67 year old female who presents to have her BMI calculated.  She has family history of diabetes, had gestational diabetes and has been told she is now pre-diabetic.   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   She has been on Contrave in the past without any luck.  Patient wants to go on Ozempic.  I have explained to her that Ozempic and Greggory Keen are for the treatment of diabetes.  She is asking if she can try Wegovy or Zepbound for weight loss.  Her BMI today is 29.99.    Patient is aware that her PCP is out of the office until next week but has asked me to send this information and ask if she can have Rx sent to pharmacy

## 2023-05-25 NOTE — Assessment & Plan Note (Signed)
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  Continue surveillance.  

## 2023-05-25 NOTE — Assessment & Plan Note (Signed)
She has sleep apnea, reports to be compliant with CPAP  JAK2 V617F mutation negative, with reflex to other mutations CALR, MPL, JAK 2 Ex 12-15 mutations negative. negative bcr abl1 No need for phlebotomy for now.  Continue observation.

## 2023-05-25 NOTE — Progress Notes (Signed)
Hematology/Oncology Progress note [phone] [phone]      Patient Care Team: Jacky Kindle, FNP as PCP - General (Family Medicine) Benita Gutter, RN as Oncology Nurse Navigator  ASSESSMENT & PLAN:   BRCA1 gene mutation positive in female Recommend bilateral diagnostic mammogram annually alternate with breast MRI bilaterally annually. Obtain bilateral screen mammogram asap-  Dec 2024 breast MRI bilaterally   No family history of pancreatic caner- screen MRI abdomen was not approved by insurance previosuly  Erythrocytosis She has sleep apnea, reports to be compliant with CPAP  JAK2 V617F mutation negative, with reflex to other mutations CALR, MPL, JAK 2 Ex 12-15 mutations negative. negative bcr abl1 No need for phlebotomy for now.  Continue observation.    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  Continue surveillance.   Orders Placed This Encounter  Procedures   MM 3D SCREENING MAMMOGRAM BILATERAL BREAST    Standing Status:   Future    Standing Expiration Date:   05/23/2024    Order Specific Question:   Reason for Exam (SYMPTOM  OR DIAGNOSIS REQUIRED)    Answer:   Breast cancer    Order Specific Question:   Preferred imaging location?    Answer:   [facility]   MR BREAST BILATERAL W WO CONTRAST INC CAD    Standing Status:   Future    Standing Expiration Date:   05/17/2024    Order Specific Question:   If indicated for the ordered procedure, I authorize the administration of contrast media per Radiology protocol    Answer:   Yes    Order Specific Question:   What is the patient's sedation requirement?    Answer:   No Sedation    Order Specific Question:   Does the patient have a pacemaker or implanted devices?    Answer:   No    Order Specific Question:   Radiology Contrast Protocol - do NOT remove file path    Answer:   [email or web address]     Order Specific Question:   Preferred imaging location?    Answer:   [facility] (table limit - 550lbs)   CBC with Differential (Cancer Center Only)    Standing Status:   Future    Standing Expiration Date:   05/23/2024   CMP (Cancer Center only)    Standing Status:   Future    Standing Expiration Date:   05/23/2024   CA 125    Standing Status:   Future    Standing Expiration Date:   05/23/2024   Follow up in 1 year. All questions were answered. The patient knows to call the clinic with any problems, questions or concerns.  Rickard Patience, MD, PhD [facility] Hematology Oncology 05/24/2023    CHIEF COMPLAINTS/REASON FOR VISIT:  Follow up for high risk for breast cancer, BRCA-1 mutation.   HISTORY OF PRESENTING ILLNESS:   JIM PHILEMON is a  67 y.o.  female with PMH listed below was seen in consultation at the request of  Margaretann Loveless, P*  for evaluation of high risk for breast cancer, BRCA mutation.   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  patient underwent surgery, postoperatively received adjuvant therapy with cisplatin and paclitaxel, intraperitoneal and IV therapy.  Patient has been in complete remission since then. Patient lost follow-up for a few years  and recently reestablished care with Dr. Sonia Side.  Per patient, patient was tested positive for BRCA1 mutation and she was sent to establish care for high risk breast cancer clinic. Last mammogram was done in August 2017.  Patient has no complaints today.  Patient denies any breast concerns.  Patient reports that one of her son was also tested positive for BRCA1 mutation.  Her younger sons have not been tested yet.  BRCA1 mutation report was scanned to EMR.  INTERVAL HISTORY PAIZLEE KINDER is a 67 y.o. female who has above history reviewed by me today presents for  follow up visit for management of history of ovarian cancer, BRCA1 mutation. No new complains.  She uses CPAP machine every day. Per patient she has CPAP serviced, and all parameters were normal. She has no new breast concerns.   Review of Systems  Constitutional:  Negative for appetite change, chills, fatigue and fever.  HENT:   Negative for hearing loss and voice change.   Eyes:  Negative for eye problems.  Respiratory:  Negative for chest tightness and cough.   Cardiovascular:  Negative for chest pain.  Gastrointestinal:  Negative for abdominal distention, abdominal pain and blood in stool.  Endocrine: Negative for hot flashes.  Genitourinary:  Negative for difficulty urinating and frequency.   Musculoskeletal:  Negative for arthralgias.  Skin:  Negative for itching and rash.  Neurological:  Negative for extremity weakness.  Hematological:  Negative for adenopathy.  Psychiatric/Behavioral:  Negative for confusion.     MEDICAL HISTORY:  Past Medical History:  Diagnosis Date   Asthma    BRCA1 gene mutation positive    Cancer (HCC) 2006   ovarian cancer 2006   Gestational diabetes    Hyperthyroidism    Hypothyroidism    Kidney stones    Personal history of chemotherapy 2006   ovarian ca   Sleep apnea    CPAP    SURGICAL HISTORY: Past Surgical History:  Procedure Laterality Date   ABDOMINAL HYSTERECTOMY Bilateral    AND BSO   COLONOSCOPY WITH PROPOFOL N/A 07/03/2015   Procedure: COLONOSCOPY WITH PROPOFOL;  Surgeon: Elnita Maxwell, MD;  Location: [facility] ENDOSCOPY;  Service: Endoscopy;  Laterality: N/A;   COLONOSCOPY WITH PROPOFOL N/A 10/20/2021   Procedure: COLONOSCOPY WITH PROPOFOL;  Surgeon: Toledo, Boykin Nearing, MD;  Location: [facility] ENDOSCOPY;  Service: Gastroenterology;  Laterality: N/A;   Dental Implant     ESOPHAGOGASTRODUODENOSCOPY N/A 10/20/2021   Procedure: ESOPHAGOGASTRODUODENOSCOPY (EGD);  Surgeon: Toledo, Boykin Nearing, MD;  Location: [facility] ENDOSCOPY;  Service:  Gastroenterology;  Laterality: N/A;   OOPHORECTOMY     TONSILLECTOMY AND ADENOIDECTOMY      SOCIAL HISTORY: Social History   Socioeconomic History   Marital status: Married    Spouse name: Not on file   Number of children: Not on file   Years of education: Not on file   Highest education level: Not on file  Occupational History   Not on file  Tobacco Use   Smoking status: Former    Packs/day: .5    Types: Cigarettes   Smokeless tobacco: Never   Tobacco comments:    35 years ago  Vaping Use   Vaping Use: Never used  Substance and Sexual Activity   Alcohol use: No   Drug use: No   Sexual activity: Yes  Other Topics Concern   Not on file  Social History Narrative   Not on file   Social Determinants of Health   Financial Resource Strain: Not  on file  Food Insecurity: Not on file  Transportation Needs: Not on file  Physical Activity: Not on file  Stress: Not on file  Social Connections: Not on file  Intimate Partner Violence: Not on file    FAMILY HISTORY: Family History  Problem Relation Age of Onset   Diabetes Mother    Hypertension Mother    Diabetes Maternal Aunt    Heart disease Maternal Aunt    Hypertension Maternal Aunt    Diabetes Maternal Uncle    Heart disease Maternal Uncle    Hypertension Maternal Uncle    Colon cancer Maternal Uncle    Brain cancer Maternal Grandmother    Diabetes Cousin    Breast cancer Cousin    Melanoma Other    Stomach cancer Other    Prostate cancer Maternal Uncle    Prostate cancer Maternal Uncle    Breast cancer Cousin    Breast cancer Cousin    Breast cancer Cousin    BRCA 1/2 Son    Breast cancer Cousin    Breast cancer Cousin     ALLERGIES:  is allergic to other, tape, gadobenate, and iodinated contrast media.  MEDICATIONS:  Current Outpatient Medications  Medication Sig Dispense Refill   buPROPion (WELLBUTRIN XL) 150 MG 24 hr tablet TAKE 1 TABLET BY MOUTH EVERY DAY 90 tablet 0   tretinoin (RETIN-A) 0.05  % cream Apply topically at bedtime. 45 g 6   diclofenac Sodium (VOLTAREN ARTHRITIS PAIN) 1 % GEL Apply 4 g topically 4 (four) times daily. (Patient not taking: Reported on 05/24/2023) 150 g 11   pantoprazole (PROTONIX) 20 MG tablet Take 20 mg by mouth daily as needed. (Patient not taking: Reported on 05/24/2023)     valACYclovir (VALTREX) 1000 MG tablet TAKE 2 TABLETS A FIRST SIGN OF FLARE REPEAT IN 12 HOURS (Patient not taking: Reported on 05/24/2023) 30 tablet 2   No current facility-administered medications for this visit.     PHYSICAL EXAMINATION: ECOG PERFORMANCE STATUS: 0 - Asymptomatic Vitals:   05/24/23 1419  BP: (!) 104/92  Pulse: 81  Resp: 18  Temp: 98.2 F (36.8 C)   Filed Weights   05/24/23 1419  Weight: 168 lb 9.6 oz (76.5 kg)    Physical Exam Constitutional:      General: She is not in acute distress. HENT:     Head: Normocephalic and atraumatic.  Eyes:     General: No scleral icterus. Cardiovascular:     Rate and Rhythm: Normal rate and regular rhythm.     Heart sounds: Normal heart sounds.  Pulmonary:     Effort: Pulmonary effort is normal. No respiratory distress.     Breath sounds: No wheezing.  Abdominal:     General: Bowel sounds are normal. There is no distension.     Palpations: Abdomen is soft.  Musculoskeletal:        General: No deformity. Normal range of motion.     Cervical back: Normal range of motion and neck supple.  Skin:    General: Skin is warm and dry.     Findings: No erythema or rash.  Neurological:     Mental Status: She is alert and oriented to person, place, and time. Mental status is at baseline.     Cranial Nerves: No cranial nerve deficit.     Coordination: Coordination normal.  Psychiatric:        Mood and Affect: Mood normal.     LABORATORY DATA:  I have reviewed the  data as listed Lab Results  Component Value Date   WBC 8.3 05/19/2023   HGB 15.6 (H) 05/19/2023   HCT 45.9 05/19/2023   MCV 86.1 05/19/2023   PLT 239  05/19/2023   Recent Labs    02/28/23 1354 05/19/23 1541  NA 140 139  K 3.9 3.7  CL 102 102  CO2 23 26  GLUCOSE 88 97  BUN 14 16  CREATININE 0.80 0.87  CALCIUM 9.7 9.9  GFRNONAA  --  >60  PROT 6.9 7.8  ALBUMIN 4.5 4.6  AST 18 23  ALT 25 24  ALKPHOS 79 69  BILITOT 0.4 1.1    RADIOGRAPHIC STUDIES: I have personally reviewed the radiological images as listed and agreed with the findings in the report.  No results found.

## 2023-05-25 NOTE — Assessment & Plan Note (Signed)
Recommend bilateral diagnostic mammogram annually alternate with breast MRI bilaterally annually. Obtain bilateral screen mammogram asap-  Dec 2024 breast MRI bilaterally   No family history of pancreatic caner- screen MRI abdomen was not approved by insurance previosuly

## 2023-05-30 ENCOUNTER — Other Ambulatory Visit: Payer: Self-pay | Admitting: Family Medicine

## 2023-05-30 MED ORDER — TIRZEPATIDE-WEIGHT MANAGEMENT 2.5 MG/0.5ML ~~LOC~~ SOAJ: 2.5000 mg | SUBCUTANEOUS | 0 refills | Status: DC

## 2023-05-31 NOTE — Progress Notes (Signed)
Patient was advised.  

## 2023-06-06 ENCOUNTER — Telehealth: Payer: Self-pay | Admitting: Family Medicine

## 2023-06-06 NOTE — Telephone Encounter (Signed)
Patient called stated [place] [employer] is requesting prior authorization for the medication tirzepatide (ZEPBOUND) 2.5 MG/0.5ML Pen. Please contact them at [phone].

## 2023-06-08 ENCOUNTER — Ambulatory Visit
Admission: RE | Admit: 2023-06-08 | Discharge: 2023-06-08 | Disposition: A | Discharge status: Home / Self Care | Payer: BC Managed Care – PPO | Source: Ambulatory Visit | Attending: Family Medicine | Admitting: Family Medicine

## 2023-06-08 ENCOUNTER — Ambulatory Visit
Admission: RE | Admit: 2023-06-08 | Discharge: 2023-06-08 | Disposition: A | Discharge status: Home / Self Care | Payer: BC Managed Care – PPO | Source: Ambulatory Visit | Attending: Oncology | Admitting: Oncology

## 2023-06-08 DIAGNOSIS — Z1501 Genetic susceptibility to malignant neoplasm of breast: Secondary | ICD-10-CM | POA: Insufficient documentation

## 2023-06-08 DIAGNOSIS — Z1502 Genetic susceptibility to malignant neoplasm of ovary: Secondary | ICD-10-CM | POA: Diagnosis present

## 2023-06-08 DIAGNOSIS — Z1509 Genetic susceptibility to other malignant neoplasm: Secondary | ICD-10-CM | POA: Diagnosis present

## 2023-06-08 DIAGNOSIS — M810 Age-related osteoporosis without current pathological fracture: Secondary | ICD-10-CM | POA: Diagnosis present

## 2023-06-08 NOTE — Progress Notes (Signed)
Osteoporosis, decreased bone strength noted. Can use Vit D 800 IU and Calcium 1200 mg to assist regular exercise for bone health. Can repeat DEXA in 2-3 years if desired. Call also start Fosamax or similar if interested.  Please let us know if you have any questions.  Thank you,  Merita Norton, FNP

## 2023-06-09 ENCOUNTER — Telehealth: Payer: Self-pay | Admitting: Family Medicine

## 2023-06-09 ENCOUNTER — Other Ambulatory Visit: Payer: Self-pay | Admitting: Family Medicine

## 2023-06-09 DIAGNOSIS — M810 Age-related osteoporosis without current pathological fracture: Secondary | ICD-10-CM

## 2023-06-09 NOTE — Telephone Encounter (Signed)
Advixsed

## 2023-06-09 NOTE — Telephone Encounter (Signed)
Patient called stated she would like to start on the yearly infusion. She will be going out of town a couple of weeks this month but she will be available July 21-29. Please contact patient to discuss the infusions.

## 2023-06-12 ENCOUNTER — Other Ambulatory Visit: Payer: Self-pay | Admitting: Family Medicine

## 2023-06-12 ENCOUNTER — Other Ambulatory Visit: Payer: Self-pay | Admitting: Dermatology

## 2023-06-15 NOTE — Telephone Encounter (Signed)
Called [facility] pharmacy and they report PA is not needed. I did call patient's insurance to confirm this. And they report weight loss medication is excluded from patient's plan. I did leave a detailed message for patient advising of information. Pharmacisit did say that patient could go to Zepbound.com for a coupon. Information left on vm.

## 2023-07-05 ENCOUNTER — Ambulatory Visit: Payer: BC Managed Care – PPO | Admitting: Dermatology

## 2023-07-05 ENCOUNTER — Ambulatory Visit
Admission: RE | Admit: 2023-07-05 | Discharge: 2023-07-05 | Disposition: A | Discharge status: Home / Self Care | Payer: BC Managed Care – PPO | Source: Ambulatory Visit | Attending: Dermatology | Admitting: Dermatology

## 2023-07-05 ENCOUNTER — Ambulatory Visit: Admission: RE | Admit: 2023-07-05 | Payer: BC Managed Care – PPO | Source: Ambulatory Visit | Admitting: *Deleted

## 2023-07-05 DIAGNOSIS — Z79899 Other long term (current) drug therapy: Secondary | ICD-10-CM

## 2023-07-05 DIAGNOSIS — M868X4 Other osteomyelitis, hand: Secondary | ICD-10-CM | POA: Insufficient documentation

## 2023-07-05 DIAGNOSIS — Z7189 Other specified counseling: Secondary | ICD-10-CM

## 2023-07-05 DIAGNOSIS — L03012 Cellulitis of left finger: Secondary | ICD-10-CM

## 2023-07-05 MED ORDER — TOBRAMYCIN 0.3 % OP SOLN: OPHTHALMIC | 1 refills | Status: DC

## 2023-07-05 MED ORDER — CIPROFLOXACIN HCL 250 MG PO TABS: ORAL_TABLET | ORAL | 0 refills | Status: DC

## 2023-07-05 NOTE — Patient Instructions (Addendum)
Recommend applying OTC DermaNail conditioner to cuticle area of fingernails twice daily to help with chipping/breaking.  May take 3-6 months of regular use to get best result.  Could also add OTC Biotin supplement 2.5 mg daily to help strengthen nails.  Recommend mild soap with handwashing followed by a thick moisturizing cream to fingernails.  May use Cutemol cream,which comes with DermaNail, Neutrogena [place] hand cream, or Eucerin cream original.     Due to recent changes in healthcare laws, you may see results of your pathology and/or laboratory studies on MyChart before the doctors have had a chance to review them. We understand that in some cases there may be results that are confusing or concerning to you. Please understand that not all results are received at the same time and often the doctors may need to interpret multiple results in order to provide you with the best plan of care or course of treatment. Therefore, we ask that you please give [place] 2 business days to thoroughly review all your results before contacting the office for clarification. Should we see a critical lab result, you will be contacted sooner.   If You Need Anything After Your Visit  If you have any questions or concerns for your doctor, please call our main line at [phone] and press option 4 to reach your doctor's medical assistant. If no one answers, please leave a voicemail as directed and we will return your call as soon as possible. Messages left after 4 pm will be answered the following business day.   You may also send [place] a message via MyChart. We typically respond to MyChart messages within 1-2 business days.  For prescription refills, please ask your pharmacy to contact our office. Our fax number is [phone].  If you have an urgent issue when the clinic is closed that cannot wait until the next business day, you can page your doctor at the number below.    Please note that while we do our best to be  available for urgent issues outside of office hours, we are not available 24/7.   If you have an urgent issue and are unable to reach [place], you may choose to seek medical care at your doctor's office, retail clinic, urgent care center, or emergency room.  If you have a medical emergency, please immediately call 911 or go to the emergency department.  Pager Numbers  - Dr. Gwen Pounds: [phone]  - Dr. Roseanne Reno: [phone]  In the event of inclement weather, please call our main line at [phone] for an update on the status of any delays or closures.  Dermatology Medication Tips: Please keep the boxes that topical medications come in in order to help keep track of the instructions about where and how to use these. Pharmacies typically print the medication instructions only on the boxes and not directly on the medication tubes.   If your medication is too expensive, please contact our office at [phone] option 4 or send [place] a message through MyChart.   We are unable to tell what your co-pay for medications will be in advance as this is different depending on your insurance coverage. However, we may be able to find a substitute medication at lower cost or fill out paperwork to get insurance to cover a needed medication.   If a prior authorization is required to get your medication covered by your insurance company, please allow [place] 1-2 business days to complete this process.  Drug prices often vary depending on where the  prescription is filled and some pharmacies may offer cheaper prices.  The website www.goodrx.com contains coupons for medications through different pharmacies. The prices here do not account for what the cost may be with help from insurance (it may be cheaper with your insurance), but the website can give you the price if you did not use any insurance.  - You can print the associated coupon and take it with your prescription to the pharmacy.  - You may also stop by our  office during regular business hours and pick up a GoodRx coupon card.  - If you need your prescription sent electronically to a different pharmacy, notify our office through [facility] or by phone at [phone] option 4.     Si Usted Necesita Algo Despus de Su Visita  Tambin puede enviarnos un mensaje a travs de Clinical cytogeneticist. Por lo general respondemos a los mensajes de MyChart en el transcurso de 1 a 2 das hbiles.  Para renovar recetas, por favor pida a su farmacia que se ponga en contacto con nuestra oficina. Annie Sable de fax es [place] [phone].  Si tiene un asunto urgente cuando la clnica est cerrada y que no puede esperar hasta el siguiente da hbil, puede llamar/localizar a su doctor(a) al nmero que aparece a continuacin.   Por favor, tenga en cuenta que aunque hacemos todo lo posible para estar disponibles para asuntos urgentes fuera del horario de [place], no estamos disponibles las 24 horas del da, los 7 [address]  [address] de la [place].   Si tiene un problema urgente y no puede comunicarse con nosotros, puede optar por buscar atencin mdica  en el consultorio de su doctor(a), en una clnica privada, en un centro de atencin urgente o en una sala de emergencias.  Si tiene Engineer, drilling, por favor llame inmediatamente al 911 o vaya a la sala de emergencias.  Nmeros de bper  - Dr. Gwen Pounds: [phone]  - Dra. Roseanne Reno: [phone]  En caso de inclemencias del [place], por favor llame a Lacy Duverney principal al [phone] para una actualizacin sobre el [place] de cualquier retraso o cierre.  Consejos para la medicacin en dermatologa: Por favor, guarde las cajas en las que vienen los medicamentos de uso tpico para ayudarle a seguir las instrucciones sobre dnde y cmo usarlos. Las farmacias generalmente imprimen las instrucciones del medicamento slo en las cajas y no directamente en los tubos del [place].   Si su medicamento es muy caro, por favor,  pngase en contacto con Rolm Gala llamando al [phone] y presione la opcin 4 o envenos un mensaje a travs de Clinical cytogeneticist.   No podemos decirle cul ser su copago por los medicamentos por adelantado ya que esto es diferente dependiendo de la cobertura de su seguro. Sin embargo, es posible que podamos encontrar un medicamento sustituto a Audiological scientist un formulario para que el seguro cubra el medicamento que se considera necesario.   Si se requiere una autorizacin previa para que su compaa de seguros [place] su medicamento, por favor permtanos de 1 a 2 das hbiles para completar [address]  Los precios de los medicamentos varan con frecuencia dependiendo del Environmental consultant de dnde se surte la receta y alguna farmacias pueden ofrecer precios ms baratos.  El sitio web www.goodrx.com tiene cupones para medicamentos de Health and safety inspector. Los precios aqu no tienen en cuenta lo que podra costar con la ayuda del seguro (puede ser ms barato con su seguro), pero el sitio web puede darle el precio si  no utiliz [employer].  - Puede imprimir el cupn correspondiente y llevarlo con su receta a la farmacia.  - Tambin puede pasar por nuestra oficina durante el horario de atencin regular y Education officer, museum una tarjeta de cupones de GoodRx.  - Si necesita que su receta se enve electrnicamente a una farmacia diferente, informe a nuestra oficina a travs de MyChart de [place] o por telfono llamando al [phone] y presione la opcin 4.

## 2023-07-05 NOTE — Progress Notes (Signed)
   Follow-Up Visit   Subjective  Joyce Decker is a 67 y.o. female who presents for the following: Patient c/o nail splitting and discoloration on the left thumbnail x 6 months.  The patient has spots, moles and lesions to be evaluated, some may be new or changing and the patient may have concern these could be cancer.  The following portions of the chart were reviewed this encounter and updated as appropriate: medications, allergies, medical history  Review of Systems:  No other skin or systemic complaints except as noted in HPI or Assessment and Plan.  Objective  Well appearing patient in no apparent distress; mood and affect are within normal limits. A focused examination was performed of the following areas: left thumb Relevant exam findings are noted in the Assessment and Plan.   Assessment & Plan   Pseudomonas Paronychia  Need to R/O Osteomyelitis of distal phalange of L thumb by Xray  Left thumb with a Nail split and green discoloration   Start Cipro 250 mg bid x 7 days  Start Tobramycin drops apply to affected nails daily   Recommend Recommend applying OTC DermaNail conditioner to cuticle area of fingernails twice daily to help with chipping/breaking.  May take 3-6 months of regular use to get best result.  Could also add OTC Biotin supplement 2.5 mg daily to help strengthen nails.  Recommend mild soap with handwashing followed by a thick moisturizing cream to fingernails.  May use Cutemol cream,which comes with DermaNail, Neutrogena [place] hand cream, or Eucerin cream original.   We will order at X Ray R/O osteomyelitis   Other osteomyelitis of left hand (HCC)  Related Procedures DG Finger Thumb Left   Return if symptoms worsen or fail to improve.  IAngelique Holm, CMA, am acting as scribe for Armida Sans, MD .   Documentation: I have reviewed the above documentation for accuracy and completeness, and I agree with the above.  Armida Sans, MD

## 2023-07-10 ENCOUNTER — Other Ambulatory Visit: Payer: Self-pay | Admitting: Family Medicine

## 2023-07-12 ENCOUNTER — Telehealth: Payer: Self-pay

## 2023-07-12 NOTE — Telephone Encounter (Signed)
Advised patient of xray results and scheduled 3 mo follow up/hd

## 2023-07-12 NOTE — Telephone Encounter (Signed)
-----   Message from Armida Sans sent at 07/12/2023 12:40 PM EDT ----- IMPRESSION: 1. No acute or destructive bony abnormalities. No radiographic evidence of osteomyelitis. 2. Soft tissue swelling distal aspect first digit. 3. 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   Left thumb Xray from 07/05/2023 showed no evidence of bone infection. Continue oral Cipro and topical Tobramycin as prescribed. Make follow up appt for 3 mos.  Continue topical Tobramycin until follow up appt.

## 2023-07-14 ENCOUNTER — Encounter: Payer: Self-pay | Admitting: Dermatology

## 2023-08-31 ENCOUNTER — Other Ambulatory Visit: Payer: Self-pay | Admitting: Dermatology

## 2023-09-21 ENCOUNTER — Ambulatory Visit (INDEPENDENT_AMBULATORY_CARE_PROVIDER_SITE_OTHER): Payer: BC Managed Care – PPO | Admitting: Dermatology

## 2023-09-21 DIAGNOSIS — Z7189 Other specified counseling: Secondary | ICD-10-CM | POA: Diagnosis not present

## 2023-09-21 DIAGNOSIS — B965 Pseudomonas (aeruginosa) (mallei) (pseudomallei) as the cause of diseases classified elsewhere: Secondary | ICD-10-CM

## 2023-09-21 DIAGNOSIS — L603 Nail dystrophy: Secondary | ICD-10-CM

## 2023-09-21 DIAGNOSIS — Z79899 Other long term (current) drug therapy: Secondary | ICD-10-CM

## 2023-09-21 DIAGNOSIS — L03012 Cellulitis of left finger: Secondary | ICD-10-CM

## 2023-09-21 DIAGNOSIS — Z8619 Personal history of other infectious and parasitic diseases: Secondary | ICD-10-CM

## 2023-09-21 MED ORDER — VALACYCLOVIR HCL 1 G PO TABS
ORAL_TABLET | ORAL | 3 refills | Status: AC
Start: 2023-09-21 — End: ?

## 2023-09-21 NOTE — Patient Instructions (Addendum)
Recommend latex gloves Continue drops Start soak    Due to recent changes in healthcare laws, you may see results of your pathology and/or laboratory studies on MyChart before the doctors have had a chance to review them. We understand that in some cases there may be results that are confusing or concerning to you. Please understand that not all results are received at the same time and often the doctors may need to interpret multiple results in order to provide you with the best plan of care or course of treatment. Therefore, we ask that you please give [place] 2 business days to thoroughly review all your results before contacting the office for clarification. Should we see a critical lab result, you will be contacted sooner.   If You Need Anything After Your Visit  If you have any questions or concerns for your doctor, please call our main line at [phone] and press option 4 to reach your doctor's medical assistant. If no one answers, please leave a voicemail as directed and we will return your call as soon as possible. Messages left after 4 pm will be answered the following business day.   You may also send [place] a message via MyChart. We typically respond to MyChart messages within 1-2 business days.  For prescription refills, please ask your pharmacy to contact our office. Our fax number is [phone].  If you have an urgent issue when the clinic is closed that cannot wait until the next business day, you can page your doctor at the number below.    Please note that while we do our best to be available for urgent issues outside of office hours, we are not available 24/7.   If you have an urgent issue and are unable to reach [place], you may choose to seek medical care at your doctor's office, retail clinic, urgent care center, or emergency room.  If you have a medical emergency, please immediately call 911 or go to the emergency department.  Pager Numbers  - Dr. Gwen Pounds: [phone]  -  Dr. Roseanne Reno: [phone]  - Dr. Katrinka Blazing: [phone]   In the event of inclement weather, please call our main line at [phone] for an update on the status of any delays or closures.  Dermatology Medication Tips: Please keep the boxes that topical medications come in in order to help keep track of the instructions about where and how to use these. Pharmacies typically print the medication instructions only on the boxes and not directly on the medication tubes.   If your medication is too expensive, please contact our office at [phone] option 4 or send [place] a message through MyChart.   We are unable to tell what your co-pay for medications will be in advance as this is different depending on your insurance coverage. However, we may be able to find a substitute medication at lower cost or fill out paperwork to get insurance to cover a needed medication.   If a prior authorization is required to get your medication covered by your insurance company, please allow [place] 1-2 business days to complete this process.  Drug prices often vary depending on where the prescription is filled and some pharmacies may offer cheaper prices.  The website www.goodrx.com contains coupons for medications through different pharmacies. The prices here do not account for what the cost may be with help from insurance (it may be cheaper with your insurance), but the website can give you the price if you did not use any insurance.  -  You can print the associated coupon and take it with your prescription to the pharmacy.  - You may also stop by our office during regular business hours and pick up a GoodRx coupon card.  - If you need your prescription sent electronically to a different pharmacy, notify our office through [facility] or by phone at [phone] option 4.     Si Usted Necesita Algo Despus de Su Visita  Tambin puede enviarnos un mensaje a travs de Clinical cytogeneticist. Por lo general respondemos a los  mensajes de MyChart en el transcurso de 1 a 2 das hbiles.  Para renovar recetas, por favor pida a su farmacia que se ponga en contacto con nuestra oficina. Annie Sable de fax es [place] [phone].  Si tiene un asunto urgente cuando la clnica est cerrada y que no puede esperar hasta el siguiente da hbil, puede llamar/localizar a su doctor(a) al nmero que aparece a continuacin.   Por favor, tenga en cuenta que aunque hacemos todo lo posible para estar disponibles para asuntos urgentes fuera del horario de [place], no estamos disponibles las 24 horas del da, los 7 [address]  [address] de la [place].   Si tiene un problema urgente y no puede comunicarse con nosotros, puede optar por buscar atencin mdica  en el consultorio de su doctor(a), en una clnica privada, en un centro de atencin urgente o en una sala de emergencias.  Si tiene Engineer, drilling, por favor llame inmediatamente al 911 o vaya a la sala de emergencias.  Nmeros de bper  - Dr. Gwen Pounds: [phone]  - Dra. Roseanne Reno: [phone]  - Dr. Katrinka Blazing: [phone]   En caso de inclemencias del tiempo, por favor llame a Lacy Duverney principal al [phone] para una actualizacin sobre el [place] de cualquier retraso o cierre.  Consejos para la medicacin en dermatologa: Por favor, guarde las cajas en las que vienen los medicamentos de uso tpico para ayudarle a seguir las instrucciones sobre dnde y cmo usarlos. Las farmacias generalmente imprimen las instrucciones del medicamento slo en las cajas y no directamente en los tubos del [place].   Si su medicamento es muy caro, por favor, pngase en contacto con Rolm Gala llamando al [phone] y presione la opcin 4 o envenos un mensaje a travs de Clinical cytogeneticist.   No podemos decirle cul ser su copago por los medicamentos por adelantado ya que esto es diferente dependiendo de la cobertura de su seguro. Sin embargo, es posible que podamos encontrar un medicamento sustituto a  Audiological scientist un formulario para que el seguro cubra el medicamento que se considera necesario.   Si se requiere una autorizacin previa para que su compaa de seguros [place] su medicamento, por favor permtanos de 1 a 2 das hbiles para completar [address]  Los precios de los medicamentos varan con frecuencia dependiendo del Environmental consultant de dnde se surte la receta y alguna farmacias pueden ofrecer precios ms baratos.  El sitio web www.goodrx.com tiene cupones para medicamentos de Health and safety inspector. Los precios aqu no tienen en cuenta lo que podra costar con la ayuda del seguro (puede ser ms barato con su seguro), pero el sitio web puede darle el precio si no utiliz Tourist information centre manager.  - Puede imprimir el cupn correspondiente y llevarlo con su receta a la farmacia.  - Tambin puede pasar por nuestra oficina durante el horario de atencin regular y Education officer, museum una tarjeta de cupones de GoodRx.  - Si necesita que su receta se enve electrnicamente a [place],  informe a nuestra oficina a travs de MyChart de [place] o por telfono llamando al [phone] y presione la opcin 4.

## 2023-09-21 NOTE — Progress Notes (Signed)
   Follow-Up Visit   Subjective  Joyce Decker is a 67 y.o. female who presents for the following: has finished antibiotic and is using tobramycin. Nail at left thumb is continueing to grow out split. Patient is concerned that infection is coming back.   Reports had trauma 20 plus years ago. Patient wears nail strengthener now to help nail and taking biotin supplement. But patient nail is continueing to grow out split.  Patient is bothered by split.    The patient has spots, moles and lesions to be evaluated, some may be new or changing and the patient may have concern these could be cancer.   The following portions of the chart were reviewed this encounter and updated as appropriate: medications, allergies, medical history  Review of Systems:  No other skin or systemic complaints except as noted in HPI or Assessment and Plan.  Objective  Well appearing patient in no apparent distress; mood and affect are within normal limits.   A focused examination was performed of the following areas: Left thumb   Relevant exam findings are noted in the Assessment and Plan.        Assessment & Plan   Pseudomonas Paronychia  Xray 07/12/2023 negative for Osteomyelitis changes.   Left thumb with a Nail split and green discoloration    Completed Cipro 250 mg bid x 7 days  Continue Tobramycin drops apply to affected nails daily   From xray results 07/12/2023 IMPRESSION: 1. No acute or destructive bony abnormalities. No radiographic evidence of osteomyelitis. 2. Soft tissue swelling distal aspect first digit. 3. 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   Recommend Recommend applying OTC DermaNail conditioner to cuticle area of fingernails twice daily to help with chipping/breaking.  May take 3-6 months of regular use to get best result.  Could also add OTC Biotin supplement 2.5 mg daily to help strengthen nails.  Recommend mild soap with handwashing followed by a thick moisturizing  cream to fingernails.  May use Cutemol cream,which comes with DermaNail, Neutrogena [place] hand cream, or Eucerin cream original.    Dr Katrinka Blazing evaluated pt with me and agrees.  History of cold sores Will send Rx for Valtrex. Related Medications valACYclovir (VALTREX) 1000 MG tablet TAKE 2 TABLETS A FIRST SIGN OF FLARE REPEAT IN 12 HOURS    Return for 8 month follow up.  IAsher Muir, CMA, am acting as scribe for Armida Sans, MD.   Documentation: I have reviewed the above documentation for accuracy and completeness, and I agree with the above.  Armida Sans, MD

## 2023-10-07 ENCOUNTER — Encounter: Payer: Self-pay | Admitting: Dermatology

## 2023-10-09 ENCOUNTER — Ambulatory Visit: Payer: BC Managed Care – PPO | Admitting: Dermatology

## 2023-10-12 ENCOUNTER — Ambulatory Visit: Payer: BC Managed Care – PPO | Admitting: Dermatology

## 2023-11-20 ENCOUNTER — Ambulatory Visit
Admission: RE | Admit: 2023-11-20 | Discharge: 2023-11-20 | Disposition: A | Discharge status: Home / Self Care | Payer: BC Managed Care – PPO | Source: Ambulatory Visit | Attending: Oncology | Admitting: Oncology

## 2023-11-20 DIAGNOSIS — Z1509 Genetic susceptibility to other malignant neoplasm: Secondary | ICD-10-CM | POA: Insufficient documentation

## 2023-11-20 DIAGNOSIS — Z1239 Encounter for other screening for malignant neoplasm of breast: Secondary | ICD-10-CM | POA: Insufficient documentation

## 2023-11-20 DIAGNOSIS — K7689 Other specified diseases of liver: Secondary | ICD-10-CM | POA: Insufficient documentation

## 2023-11-20 DIAGNOSIS — Z1501 Genetic susceptibility to malignant neoplasm of breast: Secondary | ICD-10-CM | POA: Diagnosis present

## 2023-11-20 DIAGNOSIS — Z1502 Genetic susceptibility to malignant neoplasm of ovary: Secondary | ICD-10-CM | POA: Diagnosis present

## 2023-11-20 MED ORDER — GADOBUTROL 1 MMOL/ML IV SOLN
7.5000 mL | Freq: Once | INTRAVENOUS | Status: AC | PRN
  Administered 2023-11-20: 7.5 mL via INTRAVENOUS

## 2023-11-30 ENCOUNTER — Telehealth: Payer: Self-pay

## 2023-11-30 DIAGNOSIS — Z1501 Genetic susceptibility to malignant neoplasm of breast: Secondary | ICD-10-CM

## 2023-11-30 NOTE — Telephone Encounter (Signed)
 Mychart message sent to pt with plan for mammo and follow up.    Mammo due in July, follow up appts will need to be adjusted to be approx 1 week after mammo.

## 2023-11-30 NOTE — Telephone Encounter (Signed)
-----   Message from Rickard Patience sent at 11/29/2023  2:52 PM EST ----- Please arrange her to get  Annual screening mammography due in July of 2025 and move her appt to be 1 week after mammogram.

## 2023-12-05 ENCOUNTER — Other Ambulatory Visit: Payer: Self-pay

## 2023-12-22 ENCOUNTER — Telehealth: Payer: Self-pay

## 2023-12-22 ENCOUNTER — Encounter: Payer: Self-pay | Admitting: Family Medicine

## 2023-12-22 NOTE — Telephone Encounter (Signed)
 Copied from CRM [id number]. 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 Pt inquiring if lung scan should be ordered due to her being high risk, pt also wanting to know if appt needs to be scheduled to be discussed further or if an order can be put in for the scan. Oncologist that follows pt is Dr. Cathie Hoops at the cancer center and pt will also ask oncologist.   Please assist pt further.

## 2024-01-05 ENCOUNTER — Telehealth: Payer: Self-pay

## 2024-01-05 NOTE — Telephone Encounter (Signed)
 Copied from CRM [id number]. 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 Pt requests that a Rx be written for a [employer] c-pap machine. Cb# [phone]

## 2024-01-08 NOTE — Telephone Encounter (Signed)
 Spoke with patient and she got what she needed.

## 2024-01-30 ENCOUNTER — Other Ambulatory Visit: Payer: Self-pay | Admitting: Dermatology

## 2024-02-26 ENCOUNTER — Ambulatory Visit: Payer: Self-pay | Admitting: *Deleted

## 2024-02-26 NOTE — Telephone Encounter (Signed)
  Chief Complaint: SOB with exertion Symptoms: patient has hx of asthma- but states this does not feel like that - no tightness in chest. Patient states she has several concerns- advised we can only address most serious- SOB- neck pain.  SOB is only with exertion and patient states she has random neck/jaw pain- over 1 month- this occurs random and can occur when sitting resting.  Frequency: symptoms stared over 1 month ago Pertinent Negatives: Patient denies chest pain, fever Disposition: [] ED /[] Urgent Care (no appt availability in office) / [x] Appointment(In office/virtual)/ []  [place] [facility]/ [] Home Care/ [] Refused Recommended Disposition /[] [place] [facility]/ []  Follow-up with PCP Additional Notes: Appointment to address SOB- possibly related neck pain- diabetes and other concerns will need to be address at another appointment    Copied from CRM [id number]. Topic: Clinical - Red Word Triage >> Feb 26, 2024  4:09 PM Alessandra Bevels wrote: Red Word that prompted transfer to Nurse Triage: Patient is calling to report shortness of breathe, pain on base of jaw, down neck, into her collar bone.  Also having some  random Shakiness to the point of (about to pass out) - Previous dx with prediabetes happing more frequently. Improves with eating. Reason for Disposition  [1] Longstanding difficulty breathing (e.g., CHF, COPD, emphysema) AND [2] WORSE than normal  Answer Assessment - Initial Assessment Questions 1. RESPIRATORY STATUS: "Describe your breathing?" (e.g., wheezing, shortness of breath, unable to speak, severe coughing)      SOB with exertion, pain in neck 2. ONSET: "When did this breathing problem begin?"      Over 1 month 3. PATTERN "Does the difficult breathing come and go, or has it been constant since it started?"      Random- 1 time every 2 weeks 4. SEVERITY: "How bad is your breathing?" (e.g., mild, moderate, severe)    - MILD: No SOB at rest, mild SOB with walking, speaks  normally in sentences, can lie down, no retractions, pulse < 100.    - MODERATE: SOB at rest, SOB with minimal exertion and prefers to sit, cannot lie down flat, speaks in phrases, mild retractions, audible wheezing, pulse 100-120.    - SEVERE: Very SOB at rest, speaks in single words, struggling to breathe, sitting hunched forward, retractions, pulse > 120      Mild- patient is very busy 5. RECURRENT SYMPTOM: "Have you had difficulty breathing before?" If Yes, ask: "When was the last time?" and "What happened that time?"      no 6. CARDIAC HISTORY: "Do you have any history of heart disease?" (e.g., heart attack, angina, bypass surgery, angioplasty)      No- pre diabetic  7. LUNG HISTORY: "Do you have any history of lung disease?"  (e.g., pulmonary embolus, asthma, emphysema)     Asthma hx 8. CAUSE: "What do you think is causing the breathing problem?"      Patient is not sure- concerned about her heart and diabetes  9. OTHER SYMPTOMS: "Do you have any other symptoms? (e.g., dizziness, runny nose, cough, chest pain, fever)     Redness in face, dizziness, weight gain  Protocols used: Breathing Difficulty-A-AH

## 2024-02-27 ENCOUNTER — Other Ambulatory Visit: Payer: Self-pay

## 2024-02-27 ENCOUNTER — Ambulatory Visit: Admitting: Internal Medicine

## 2024-02-27 ENCOUNTER — Encounter: Payer: Self-pay | Admitting: Internal Medicine

## 2024-02-27 VITALS — BP 124/82 | HR 73 | Temp 98.2°F | Resp 16 | Ht 62.0 in | Wt 183.5 lb

## 2024-02-27 DIAGNOSIS — R0602 Shortness of breath: Secondary | ICD-10-CM

## 2024-02-27 DIAGNOSIS — R6884 Jaw pain: Secondary | ICD-10-CM

## 2024-02-27 NOTE — Progress Notes (Signed)
   Acute Office Visit  Subjective:     Patient ID: Joyce Decker, female    DOB: 06-08-1956, 68 y.o.   MRN: [id number]  Chief Complaint  Patient presents with   Shortness of Breath   Pain    Behind left side radiates to jaw    Shortness of Breath Pertinent negatives include no chest pain, ear pain, leg swelling or wheezing.   Patient is in today for pain starting behind left jaw and radiating to the left collar bone. Described as severe and a deep aching type of pain. Going on about 3-6 months on and off, occurring about once every 2 weeks. No known triggers but with associated shortness of breath, occurs with rest and exertion. Does have asthma but mild and does not use inhalers. Also clenches jaw and grind teeth at night but denies TMJ joint pain. Strong family history of cardiovascular disease. LDL elevated on labs from April of last year, prescribed a statin but did not start.   Review of Systems  HENT:  Negative for ear pain and sinus pain.   Respiratory:  Positive for shortness of breath. Negative for cough and wheezing.   Cardiovascular:  Negative for chest pain, palpitations and leg swelling.        Objective:    BP 124/82 (Cuff Size: Normal)   Pulse 73   Temp 98.2 F (36.8 C) (Oral)   Resp 16   Ht 5\' 2"  (1.575 m)   Wt 183 lb 8 oz (83.2 kg)   SpO2 96%   BMI 33.56 kg/m  BP Readings from Last 3 Encounters:  05/24/23 (!) 104/92  04/25/23 124/80  02/28/23 (!) 141/93   Wt Readings from Last 3 Encounters:  02/27/24 183 lb 8 oz (83.2 kg)  05/24/23 164 lb (74.4 kg)  05/24/23 168 lb 9.6 oz (76.5 kg)      Physical Exam Constitutional:      Appearance: She is well-developed.  HENT:     Head: Normocephalic and atraumatic.     Comments: No trismus or TMJ dislocation, clicking    Right Ear: Tympanic membrane, ear canal and external ear normal.     Left Ear: Tympanic membrane, ear canal and external ear normal.  Eyes:     Conjunctiva/sclera: Conjunctivae normal.   Cardiovascular:     Rate and Rhythm: Normal rate and regular rhythm.  Pulmonary:     Effort: Pulmonary effort is normal.     Breath sounds: Normal breath sounds. No wheezing, rhonchi or rales.  Skin:    General: Skin is warm and dry.  Neurological:     General: No focal deficit present.     Mental Status: She is alert. Mental status is at baseline.  Psychiatric:        Mood and Affect: Mood normal.        Behavior: Behavior normal.     No results found for any visits on 02/27/24.      Assessment & Plan:   1. Jaw pain (Primary)/Shortness of breath: Symptoms could be multifactorial but unlikely asthma or TMJ. Vital signs normal. Concerned about potential CAD and atypical chest pain/angina. EKG here with normal sinus rhythm, discussed with patient Cardiology referral for stress test which I recommend to rule out CAD. Patient prefers coronary calcium scoring instead, will order.   - EKG 12-Lead - CT CARDIAC SCORING (SELF PAY ONLY); Future  Return if symptoms worsen or fail to improve.  Margarita Mail, DO

## 2024-03-04 ENCOUNTER — Telehealth: Payer: Self-pay | Admitting: *Deleted

## 2024-03-04 NOTE — Telephone Encounter (Signed)
 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 Then there was mold in it and she was moved to a room  instead of the mobile unit. But now she has been told that she will be in the same mobile unit that has not been use for 1 year and she knows that they will clean it but she does not feel comfortable about going back in. Especially the schools had a lot of mold and not sure if this totally to go in the mobile unit. patient feels with her ovarian cancer history and her high risk  breast BRCA1 mutation , and that her helps her mom with cancer. She would like a letter that the mobile unit she will be in is not safe because of the above info for her health

## 2024-03-05 ENCOUNTER — Telehealth: Payer: Self-pay | Admitting: *Deleted

## 2024-03-05 NOTE — Telephone Encounter (Signed)
 The pt. Wants [phone]. I am sending this with Rosa to speak to  about it. I called the pt and let her know that I sent the message for the pt to [facility]

## 2024-03-07 ENCOUNTER — Telehealth: Payer: Self-pay | Admitting: Family Medicine

## 2024-03-07 ENCOUNTER — Other Ambulatory Visit: Payer: Self-pay | Admitting: Dermatology

## 2024-03-07 DIAGNOSIS — L988 Other specified disorders of the skin and subcutaneous tissue: Secondary | ICD-10-CM

## 2024-03-07 NOTE — Telephone Encounter (Addendum)
 [facility] faxed refill request for the following medications:   metFORMIN (GLUCOPHAGE-XR) 750 MG 24 hr tablet   rosuvastatin (CRESTOR) 10 MG tablet   Not on current medication list  Please advise.

## 2024-03-07 NOTE — Telephone Encounter (Signed)
 Can someone please reach out to offer this pt an appt, before the requested medication can be filled

## 2024-03-08 NOTE — Telephone Encounter (Signed)
 Called patient to try and set up and appt had to leave voicemail

## 2024-03-14 ENCOUNTER — Telehealth: Payer: Self-pay

## 2024-03-14 ENCOUNTER — Encounter: Payer: Self-pay | Admitting: Internal Medicine

## 2024-03-14 ENCOUNTER — Ambulatory Visit
Admission: RE | Admit: 2024-03-14 | Discharge: 2024-03-14 | Disposition: A | Discharge status: Home / Self Care | Payer: Self-pay | Source: Ambulatory Visit | Attending: Internal Medicine | Admitting: Internal Medicine

## 2024-03-14 DIAGNOSIS — R0602 Shortness of breath: Secondary | ICD-10-CM | POA: Insufficient documentation

## 2024-03-14 DIAGNOSIS — R6884 Jaw pain: Secondary | ICD-10-CM | POA: Insufficient documentation

## 2024-03-14 NOTE — Telephone Encounter (Signed)
 Copied from CRM [id number]. Topic: Clinical - Prescription Issue >> Mar 14, 2024  1:03 PM Joyce Decker wrote: Reason for CRM: Patient is wanting to make sure with her provider leaving that she has a prescription written for next months ZEPBOUND for month of May. She doesn't want $650 she spent this month to go to waste and not be able to continue through May.

## 2024-04-01 ENCOUNTER — Ambulatory Visit: Admitting: Family Medicine

## 2024-04-01 ENCOUNTER — Encounter: Payer: Self-pay | Admitting: Family Medicine

## 2024-04-01 VITALS — BP 124/70 | HR 79 | Resp 16 | Ht 62.0 in | Wt 176.0 lb

## 2024-04-01 DIAGNOSIS — Z5181 Encounter for therapeutic drug level monitoring: Secondary | ICD-10-CM

## 2024-04-01 DIAGNOSIS — R7303 Prediabetes: Secondary | ICD-10-CM | POA: Diagnosis not present

## 2024-04-01 DIAGNOSIS — Z0000 Encounter for general adult medical examination without abnormal findings: Secondary | ICD-10-CM

## 2024-04-01 DIAGNOSIS — E785 Hyperlipidemia, unspecified: Secondary | ICD-10-CM

## 2024-04-01 DIAGNOSIS — R079 Chest pain, unspecified: Secondary | ICD-10-CM

## 2024-04-01 DIAGNOSIS — R6884 Jaw pain: Secondary | ICD-10-CM

## 2024-04-01 DIAGNOSIS — J4520 Mild intermittent asthma, uncomplicated: Secondary | ICD-10-CM

## 2024-04-01 DIAGNOSIS — Z6832 Body mass index (BMI) 32.0-32.9, adult: Secondary | ICD-10-CM | POA: Insufficient documentation

## 2024-04-01 DIAGNOSIS — E079 Disorder of thyroid, unspecified: Secondary | ICD-10-CM

## 2024-04-01 DIAGNOSIS — E66811 Obesity, class 1: Secondary | ICD-10-CM | POA: Diagnosis not present

## 2024-04-01 DIAGNOSIS — R0609 Other forms of dyspnea: Secondary | ICD-10-CM

## 2024-04-01 DIAGNOSIS — M810 Age-related osteoporosis without current pathological fracture: Secondary | ICD-10-CM

## 2024-04-01 MED ORDER — TIRZEPATIDE-WEIGHT MANAGEMENT 2.5 MG/0.5ML ~~LOC~~ SOLN: 2.5000 mg | SUBCUTANEOUS | 0 refills | Status: DC

## 2024-04-01 NOTE — Assessment & Plan Note (Signed)
 Wt Readings from Last 5 Encounters:  04/01/24 176 lb (79.8 kg)  02/27/24 183 lb 8 oz (83.2 kg)  05/24/23 164 lb (74.4 kg)  05/24/23 168 lb 9.6 oz (76.5 kg)  02/28/23 179 lb 12.8 oz (81.6 kg)   BMI Readings from Last 5 Encounters:  04/01/24 32.19 kg/m  02/27/24 33.56 kg/m  05/24/23 30.00 kg/m  05/24/23 30.84 kg/m  02/28/23 32.89 kg/m   Recently started zepbound  2.5 mg dose lost 7 lbs some nausea Comorbidities HLD and prediabetes

## 2024-04-01 NOTE — Progress Notes (Unsigned)
 Name: Joyce Decker   MRN: [id number]    DOB: [birth year]   Date:04/01/2024       Progress Note  Chief Complaint  Patient presents with   Establish Care    Subjective:   Joyce Decker is a 68 y.o. female, presents to clinic for routine follow up on chronic conditions  Pt hx of ovarian ca tx with chemo and oophrectomy, follows with Dr. Wilhelmenia Harada Last MR breast December 2024- RECOMMENDATION: 1.  Annual screening mammography due in July of 2025. 2.  High risk screening MRI in 1 year. 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   Lipids, she states it was worsening Lab Results  Component Value Date   CHOL 195 02/28/2023   HDL 61 02/28/2023   LDLCALC 116 (H) 02/28/2023   TRIG 103 02/28/2023   CHOLHDL 3.2 02/28/2023   Last vitamin D  Lab Results  Component Value Date   VD25OH 38.2 02/28/2023        Current Outpatient Medications:    valACYclovir  (VALTREX ) 1000 MG tablet, TAKE 2 TABLETS A FIRST SIGN OF FLARE REPEAT IN 12 HOURS, Disp: 90 tablet, Rfl: 3   ZEPBOUND  2.5 MG/0.5ML Pen, Inject 2.5 mg into the skin once a week., Disp: , Rfl:   Patient Active Problem List   Diagnosis Date Noted   Weight gain 02/28/2023   ATN (acute tubular necrosis) (HCC) 09/28/2022   Hypoglycemia 09/28/2022   Transient alteration of awareness 09/28/2022   Shakiness 09/28/2022   Screening for thyroid disorder 09/28/2022   Encounter for screening for HIV 09/28/2022   Encounter for hepatitis C screening test for low risk patient 09/28/2022   Vasovagal symptom 09/28/2022   History of gestational diabetes 09/28/2022   Encounter for vitamin deficiency screening 09/28/2022   Erythrocytosis 05/12/2022   HLD (hyperlipidemia) 04/09/2015   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   Family history of diabetes  mellitus 05/06/2008    Past Surgical History:  Procedure Laterality Date   ABDOMINAL HYSTERECTOMY Bilateral    AND BSO   COLONOSCOPY WITH PROPOFOL  N/A 07/03/2015   Procedure: COLONOSCOPY WITH PROPOFOL ;  Surgeon: Luella Sager, MD;  Location: [facility] ENDOSCOPY;  Service: Endoscopy;  Laterality: N/A;   COLONOSCOPY WITH PROPOFOL  N/A 10/20/2021   Procedure: COLONOSCOPY WITH PROPOFOL ;  Surgeon: Toledo, Alphonsus Jeans, MD;  Location: [facility] ENDOSCOPY;  Service: Gastroenterology;  Laterality: N/A;   Dental Implant     ESOPHAGOGASTRODUODENOSCOPY N/A 10/20/2021   Procedure: ESOPHAGOGASTRODUODENOSCOPY (EGD);  Surgeon: Toledo, Alphonsus Jeans, MD;  Location: [facility] ENDOSCOPY;  Service: Gastroenterology;  Laterality: N/A;   OOPHORECTOMY     TONSILLECTOMY AND ADENOIDECTOMY      Family History  Problem Relation Age of Onset   Diabetes Mother    Hypertension Mother    Diabetes Maternal Aunt    Heart disease Maternal Aunt    Hypertension Maternal Aunt    Diabetes Maternal Uncle    Heart disease Maternal Uncle    Hypertension Maternal Uncle    Colon cancer Maternal Uncle    Brain cancer Maternal Grandmother    Diabetes Cousin    Breast cancer Cousin    Melanoma Other    Stomach cancer Other    Prostate cancer Maternal Uncle    Prostate cancer Maternal Uncle  Breast cancer Cousin    Breast cancer Cousin    Breast cancer Cousin    BRCA 1/2 Son    Breast cancer Cousin    Breast cancer Cousin     Social History   Tobacco Use   Smoking status: Former    Current packs/day: 0.50    Types: Cigarettes   Smokeless tobacco: Never   Tobacco comments:    35 years ago  Vaping Use   Vaping status: Never Used  Substance Use Topics   Alcohol use: No   Drug use: No     Allergies  Allergen Reactions   Other Other (See Comments)    Wheat barley and Grain unknown allergy, possible cause headache.   Tape Other (See Comments)    Blisters   Gadobenate Nausea Only    PT became nauseous with contrast,  but did not vomit.     Iodinated Contrast Media Nausea Only    Health Maintenance  Topic Date Due   Cervical Cancer Screening (HPV/Pap Cotest)  Never done   COVID-19 Vaccine (4 - 2024-25 season) 07/30/2023   Zoster Vaccines- Shingrix (1 of 2) 07/02/2024 (Originally 04/26/1975)   Pneumonia Vaccine 20+ Years old (1 of 1 - PCV) 04/01/2025 (Originally 04/26/2008)   INFLUENZA VACCINE  06/28/2024   MAMMOGRAM  11/19/2025   Colonoscopy  10/21/2031   DTaP/Tdap/Td (9 - Td or Tdap) 07/13/2032   DEXA SCAN  Completed   Hepatitis C Screening  Completed   HIV Screening  Completed   HPV VACCINES  Aged Out   Meningococcal B Vaccine  Aged Out    Chart Review Today: I personally reviewed active problem list, medication list, allergies, family history, social history, health maintenance, notes from last encounter, lab results, imaging with the patient/caregiver today.   Review of Systems  Constitutional: Negative.   HENT: Negative.    Eyes: Negative.   Respiratory: Negative.    Cardiovascular: Negative.   Gastrointestinal: Negative.   Endocrine: Negative.   Genitourinary: Negative.   Musculoskeletal: Negative.   Skin: Negative.   Allergic/Immunologic: Negative.   Neurological: Negative.   Hematological: Negative.   Psychiatric/Behavioral: Negative.    All other systems reviewed and are negative.    Objective:   Vitals:   04/01/24 1325  BP: 124/70  Pulse: 79  Resp: 16  SpO2: 97%  Weight: 176 lb (79.8 kg)  Height: 5\' 2"  (1.575 m)    Body mass index is 32.19 kg/m.  Physical Exam Vitals and nursing note reviewed.  Constitutional:      General: She is not in acute distress.    Appearance: Normal appearance. She is well-developed. She is not ill-appearing, toxic-appearing or diaphoretic.  HENT:     Head: Normocephalic and atraumatic.     Nose: Nose normal.  Eyes:     General:        Right eye: No discharge.        Left eye: No discharge.     Conjunctiva/sclera: Conjunctivae  normal.  Neck:     Trachea: No tracheal deviation.  Cardiovascular:     Rate and Rhythm: Normal rate and regular rhythm.     Pulses: Normal pulses.     Heart sounds: Normal heart sounds.  Pulmonary:     Effort: Pulmonary effort is normal. No respiratory distress.     Breath sounds: No stridor.  Musculoskeletal:        General: Normal range of motion.  Skin:    General: Skin is warm and dry.  Findings: No rash.  Neurological:     Mental Status: She is alert.     Motor: No abnormal muscle tone.     Coordination: Coordination normal.  Psychiatric:        Behavior: Behavior normal.         Functional Status Survey: Is the patient deaf or have difficulty hearing?: No Does the patient have difficulty seeing, even when wearing glasses/contacts?: No Does the patient have difficulty concentrating, remembering, or making decisions?: No Does the patient have difficulty walking or climbing stairs?: No Does the patient have difficulty dressing or bathing?: No Does the patient have difficulty doing errands alone such as visiting a doctor's office or shopping?: No Results for orders placed or performed in visit on 05/19/23  CA 125   Collection Time: 05/19/23  3:41 PM  Result Value Ref Range   Cancer Antigen (CA) 125 7.6 0.0 - 38.1 U/mL  CMP (Cancer Center only)   Collection Time: 05/19/23  3:41 PM  Result Value Ref Range   Sodium 139 135 - 145 mmol/L   Potassium 3.7 3.5 - 5.1 mmol/L   Chloride 102 98 - 111 mmol/L   CO2 26 22 - 32 mmol/L   Glucose, Bld 97 70 - 99 mg/dL   BUN 16 8 - 23 mg/dL   Creatinine 1.61 0.96 - 1.00 mg/dL   Calcium  9.9 8.9 - 10.3 mg/dL   Total Protein 7.8 6.5 - 8.1 g/dL   Albumin 4.6 3.5 - 5.0 g/dL   AST 23 15 - 41 U/L   ALT 24 0 - 44 U/L   Alkaline Phosphatase 69 38 - 126 U/L   Total Bilirubin 1.1 0.3 - 1.2 mg/dL   GFR, Estimated >04 >54 mL/min   Anion gap 11 5 - 15  CBC with Differential (Cancer Center Only)   Collection Time: 05/19/23  3:41 PM   Result Value Ref Range   WBC Count 8.3 4.0 - 10.5 K/uL   RBC 5.33 (H) 3.87 - 5.11 MIL/uL   Hemoglobin 15.6 (H) 12.0 - 15.0 g/dL   HCT 09.8 11.9 - 14.7 %   MCV 86.1 80.0 - 100.0 fL   MCH 29.3 26.0 - 34.0 pg   MCHC 34.0 30.0 - 36.0 g/dL   RDW 82.9 56.2 - 13.0 %   Platelet Count 239 150 - 400 K/uL   nRBC 0.0 0.0 - 0.2 %   Neutrophils Relative % 56 %   Neutro Abs 4.6 1.7 - 7.7 K/uL   Lymphocytes Relative 32 %   Lymphs Abs 2.7 0.7 - 4.0 K/uL   Monocytes Relative 9 %   Monocytes Absolute 0.8 0.1 - 1.0 K/uL   Eosinophils Relative 2 %   Eosinophils Absolute 0.2 0.0 - 0.5 K/uL   Basophils Relative 1 %   Basophils Absolute 0.1 0.0 - 0.1 K/uL   Immature Granulocytes 0 %   Abs Immature Granulocytes 0.03 0.00 - 0.07 K/uL      Assessment & Plan:   Encounter for medical examination to establish care  Hyperlipidemia, unspecified hyperlipidemia type -     Comprehensive metabolic panel with GFR -     Lipid panel  Class 1 obesity with body mass index (BMI) of 32.0 to 32.9 in adult, unspecified obesity type, unspecified whether serious comorbidity present -     Tirzepatide -Weight Management; Inject 2.5 mg into the skin once a week.  Dispense: 2 mL; Refill: 0 -     Hemoglobin A1c -     Comprehensive  metabolic panel with GFR -     CBC with Differential/Platelet -     Lipid panel -     TSH -     T4, free  Prediabetes -     Hemoglobin A1c -     Comprehensive metabolic panel with GFR  Thyroid disease -     TSH -     T4, free  Osteoporosis without current pathological fracture, unspecified osteoporosis type -     Comprehensive metabolic panel with GFR  Encounter for medication monitoring -     Hemoglobin A1c -     Comprehensive metabolic panel with GFR -     CBC with Differential/Platelet -     Lipid panel -     TSH -     T4, free        Return in about 6 months (around 10/02/2024) for Follow-up, shingles/prevnar vaccine.   Adeline Hone, PA-C 04/01/24 1:35 PM

## 2024-04-01 NOTE — Patient Instructions (Signed)
 Health Maintenance  Topic Date Due   COVID-19 Vaccine (4 - 2024-25 season) 07/30/2023   Zoster (Shingles) Vaccine (1 of 2) 07/02/2024*   Pneumonia Vaccine (1 of 1 - PCV) 04/01/2025*   Flu Shot  06/28/2024   DEXA scan (bone density measurement)  06/07/2025   Mammogram  11/19/2025   Colon Cancer Screening  10/21/2031   DTaP/Tdap/Td vaccine (9 - Td or Tdap) 07/13/2032   Hepatitis C Screening  Completed   HIV Screening  Completed   HPV Vaccine  Aged Out   Meningitis B Vaccine  Aged Out  *Topic was postponed. The date shown is not the original due date.   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

## 2024-04-02 ENCOUNTER — Encounter: Payer: Self-pay | Admitting: Family Medicine

## 2024-04-02 LAB — COMPREHENSIVE METABOLIC PANEL WITH GFR
AG Ratio: 1.7 (calc) (ref 1.0–2.5)
ALT: 19 U/L (ref 6–29)
AST: 19 U/L (ref 10–35)
Albumin: 4.5 g/dL (ref 3.6–5.1)
Alkaline phosphatase (APISO): 62 U/L (ref 37–153)
BUN: 15 mg/dL (ref 7–25)
CO2: 30 mmol/L (ref 20–32)
Calcium: 9.9 mg/dL (ref 8.6–10.4)
Chloride: 103 mmol/L (ref 98–110)
Creat: 0.74 mg/dL (ref 0.50–1.05)
Globulin: 2.6 g/dL (ref 1.9–3.7)
Glucose, Bld: 119 mg/dL — ABNORMAL HIGH (ref 65–99)
Potassium: 4.1 mmol/L (ref 3.5–5.3)
Sodium: 140 mmol/L (ref 135–146)
Total Bilirubin: 0.5 mg/dL (ref 0.2–1.2)
Total Protein: 7.1 g/dL (ref 6.1–8.1)
eGFR: 90 mL/min/{1.73_m2} (ref >=60)

## 2024-04-02 LAB — LIPID PANEL
Cholesterol: 191 mg/dL (ref <200)
HDL: 55 mg/dL (ref >=50)
LDL Cholesterol (Calc): 109 mg/dL — ABNORMAL HIGH
Non-HDL Cholesterol (Calc): 136 mg/dL — ABNORMAL HIGH (ref <130)
Total CHOL/HDL Ratio: 3.5 (calc) (ref <5.0)
Triglycerides: 154 mg/dL — ABNORMAL HIGH (ref <150)

## 2024-04-02 LAB — CBC WITH DIFFERENTIAL/PLATELET
Absolute Lymphocytes: 2192 {cells}/uL (ref 850–3900)
Absolute Monocytes: 686 {cells}/uL (ref 200–950)
Basophils Absolute: 47 {cells}/uL (ref 0–200)
Basophils Relative: 0.6 %
Eosinophils Absolute: 109 {cells}/uL (ref 15–500)
Eosinophils Relative: 1.4 %
HCT: 45.4 % — ABNORMAL HIGH (ref 35.0–45.0)
Hemoglobin: 15.1 g/dL (ref 11.7–15.5)
MCH: 29 pg (ref 27.0–33.0)
MCHC: 33.3 g/dL (ref 32.0–36.0)
MCV: 87.1 fL (ref 80.0–100.0)
MPV: 11.4 fL (ref 7.5–12.5)
Monocytes Relative: 8.8 %
Neutro Abs: 4766 {cells}/uL (ref 1500–7800)
Neutrophils Relative %: 61.1 %
Platelets: 241 10*3/uL (ref 140–400)
RBC: 5.21 10*6/uL — ABNORMAL HIGH (ref 3.80–5.10)
RDW: 12.3 % (ref 11.0–15.0)
Total Lymphocyte: 28.1 %
WBC: 7.8 10*3/uL (ref 3.8–10.8)

## 2024-04-02 LAB — HEMOGLOBIN A1C
Hgb A1c MFr Bld: 5.8 % — ABNORMAL HIGH (ref <5.7)
Mean Plasma Glucose: 120 mg/dL
eAG (mmol/L): 6.6 mmol/L

## 2024-04-02 LAB — TSH: TSH: 0.82 m[IU]/L (ref 0.40–4.50)

## 2024-04-02 LAB — T4, FREE: Free T4: 1.3 ng/dL (ref 0.8–1.8)

## 2024-04-04 ENCOUNTER — Encounter: Payer: Self-pay | Admitting: Family Medicine

## 2024-04-04 NOTE — Assessment & Plan Note (Signed)
 Recheck labs, working on diet/lifestyle efforts She just started wegovy  so likely will help A1c

## 2024-04-04 NOTE — Assessment & Plan Note (Signed)
 Not currently on medications, recheck labs

## 2024-04-04 NOTE — Assessment & Plan Note (Addendum)
 Reviewed last dexa, managed by oncology Reviewed T-scores, discussed medications, fall risk prevention She is doing current conservative and preventative med maintain calcium , weightbearing exercise

## 2024-04-04 NOTE — Assessment & Plan Note (Signed)
 Pt wishes to recheck, not on meds Has been working on diet/lifestyle efforts

## 2024-04-10 ENCOUNTER — Ambulatory Visit: Admitting: Family Medicine

## 2024-04-10 ENCOUNTER — Encounter: Admitting: Family Medicine

## 2024-05-02 ENCOUNTER — Telehealth: Payer: Self-pay

## 2024-05-02 ENCOUNTER — Telehealth: Payer: Self-pay | Admitting: *Deleted

## 2024-05-02 MED ORDER — TIRZEPATIDE-WEIGHT MANAGEMENT 5 MG/0.5ML ~~LOC~~ SOLN: 5.0000 mg | SUBCUTANEOUS | 1 refills | Status: DC

## 2024-05-02 NOTE — Telephone Encounter (Signed)
 Lvm letting pt know that she will need to schedule an appointment and to bring the document that need to be filled out

## 2024-05-02 NOTE — Addendum Note (Signed)
 Addended by: Suleima Ohlendorf on: 05/02/2024 03:19 PM   Modules accepted: Orders

## 2024-05-02 NOTE — Telephone Encounter (Signed)
 She will need an appointment and will need to bring the approbate paperwork in to that appointment to be filled out.

## 2024-05-02 NOTE — Telephone Encounter (Signed)
 The patient called and she would like to have the genetic counselor to speak to her.  Does have a BRCA 1 gene mutation, and ovarian cancer. Is that ok for her to ask questions?

## 2024-05-02 NOTE — Telephone Encounter (Signed)
 Copied from CRM [id number]. 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 Patient was relocated into the building. Now the patient is asked to return back to the mobile unit.  There is still mold on the outside of the building. Papers and supplies, have not been thrown away. Patient is calling to see if Leisa can provide accommodations for her. For her to stay inside the building or another school. Cb- [phone]

## 2024-05-02 NOTE — Telephone Encounter (Signed)
 Copied from CRM [id number]. Topic: Clinical - Medical Advice >> May 02, 2024  2:51 PM Sasha H wrote: Reason for CRM: pt has questions about ZEPBOUND  2.5 MG/0.5ML Pen. Wanting to go up to a higher dose

## 2024-05-02 NOTE — Progress Notes (Signed)
 05/02/24 Pt called and asked for zepbound  dose increase Ordered next dose  Keyuna Cuthrell, PA-C

## 2024-05-03 ENCOUNTER — Telehealth: Payer: Self-pay

## 2024-05-03 ENCOUNTER — Other Ambulatory Visit: Payer: Self-pay

## 2024-05-03 DIAGNOSIS — Z6832 Body mass index (BMI) 32.0-32.9, adult: Secondary | ICD-10-CM

## 2024-05-03 DIAGNOSIS — R7303 Prediabetes: Secondary | ICD-10-CM

## 2024-05-03 MED ORDER — TIRZEPATIDE 5 MG/0.5ML ~~LOC~~ SOAJ: 5.0000 mg | SUBCUTANEOUS | 0 refills | Status: DC

## 2024-05-03 NOTE — Telephone Encounter (Signed)
 Pen ordered instead of vial

## 2024-05-03 NOTE — Telephone Encounter (Signed)
 Copied from CRM [id number]. Topic: Clinical - Prescription Issue >> May 03, 2024 10:19 AM El Gravely T wrote: Reason for CRM: Joyce Decker calling from [facility] on behalf of patient, states medication prescribed, tirzepatide  5 MG/0.5ML injection vial Cannot be dispensed by vial, they can only dispense by pen dosage. Otherwise, the  prescription would have to be sent to another pharmacy.  Tristan at pharmacy would like a return call on how to proceed with medication.  Joyce Decker can be reached back at:  [facility] - [place], [place] - [address] AT [facility] [address] [place] [address] Phone: [phone] Fax: [phone]

## 2024-05-06 ENCOUNTER — Other Ambulatory Visit (HOSPITAL_COMMUNITY): Payer: Self-pay

## 2024-05-14 ENCOUNTER — Ambulatory Visit: Admitting: Internal Medicine

## 2024-05-15 ENCOUNTER — Encounter: Payer: Self-pay | Admitting: Family Medicine

## 2024-05-15 ENCOUNTER — Ambulatory Visit: Payer: BC Managed Care – PPO | Admitting: Dermatology

## 2024-05-15 ENCOUNTER — Ambulatory Visit: Admitting: Family Medicine

## 2024-05-15 VITALS — BP 124/76 | HR 86 | Resp 16 | Ht 62.0 in | Wt 166.0 lb

## 2024-05-15 DIAGNOSIS — J45990 Exercise induced bronchospasm: Secondary | ICD-10-CM | POA: Diagnosis not present

## 2024-05-15 DIAGNOSIS — E66811 Obesity, class 1: Secondary | ICD-10-CM | POA: Diagnosis not present

## 2024-05-15 DIAGNOSIS — J4520 Mild intermittent asthma, uncomplicated: Secondary | ICD-10-CM | POA: Diagnosis not present

## 2024-05-15 DIAGNOSIS — Z6832 Body mass index (BMI) 32.0-32.9, adult: Secondary | ICD-10-CM

## 2024-05-15 DIAGNOSIS — Z0289 Encounter for other administrative examinations: Secondary | ICD-10-CM | POA: Diagnosis not present

## 2024-05-15 MED ORDER — MONTELUKAST SODIUM 10 MG PO TABS: 10.0000 mg | ORAL_TABLET | Freq: Every day | ORAL | 3 refills | Status: DC

## 2024-05-15 MED ORDER — ALBUTEROL SULFATE HFA 108 (90 BASE) MCG/ACT IN AERS: 2.0000 | INHALATION_SPRAY | RESPIRATORY_TRACT | 5 refills | Status: AC | PRN

## 2024-05-15 MED ORDER — AIRSUPRA 90-80 MCG/ACT IN AERO: 2.0000 | INHALATION_SPRAY | RESPIRATORY_TRACT | 2 refills | Status: AC | PRN

## 2024-05-15 NOTE — Progress Notes (Signed)
 Patient ID: Joyce Decker, female    DOB: 1956/09/15, 68 y.o.   MRN: [id number]  PCP: Adeline Hone, PA-C  Chief Complaint  Patient presents with   Advice Only    HR requesting accommodation letter that specifies her asthma/high risk cancer hx about what environment/setting would be safe. Job is tying to move her in to a building that had mold.    Subjective:   Joyce Decker is a 68 y.o. female, presents to clinic with CC of the following:  HPI   PT here req accommodation letter (see CC above) Pt was placed in classroom with excessive mold the start of last school Aug 2024, they relocated her to another classroom and she noted a few weeks ago the was told she has to go back into the same classroom as last August which has not had any mold remediation or intervention to the room or teaching supplies which was all left behind. The employee has said that they cleaned it but all belongings, papers, books and boards.  Asthma mild intermittent and EIB In the past asthma has been controlled with singulair and maintenance inhaler and then rescue inhaler  Currently about 1 exacerbation a year due to viral illness and EIB well controlled with albuterol   On wegovy  - she has lost about 17 lbs since starting med with prior PCP  Wt Readings from Last 5 Encounters:  05/15/24 166 lb (75.3 kg)  04/01/24 176 lb (79.8 kg)  02/27/24 183 lb 8 oz (83.2 kg)  05/24/23 164 lb (74.4 kg)  05/24/23 168 lb 9.6 oz (76.5 kg)   BMI Readings from Last 5 Encounters:  05/15/24 30.36 kg/m  04/01/24 32.19 kg/m  02/27/24 33.56 kg/m  05/24/23 30.00 kg/m  05/24/23 30.84 kg/m        Patient Active Problem List   Diagnosis Date Noted   Prediabetes 04/01/2024   Osteoporosis without current pathological fracture 04/01/2024   Thyroid disease 04/01/2024   Class 1 obesity with body mass index (BMI) of 32.0 to 32.9 in adult 04/01/2024   Weight gain 02/28/2023   ATN (acute tubular necrosis) (HCC)  09/28/2022   Hypoglycemia 09/28/2022   Transient alteration of awareness 09/28/2022   Shakiness 09/28/2022   Vasovagal symptom 09/28/2022   Erythrocytosis 05/12/2022   HLD (hyperlipidemia) 04/09/2015   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   Tape Other (See Comments)    Blisters   Gadobenate Nausea Only    PT became nauseous with contrast, but did not vomit.     Iodinated Contrast Media Nausea Only     Social History   Tobacco Use   Smoking status: Former    Current packs/day: 0.50    Types: Cigarettes   Smokeless tobacco: Never   Tobacco comments:    35 years ago  Vaping Use   Vaping status: Never Used  Substance Use Topics   Alcohol use: No   Drug use: No      Chart Review Today: I personally reviewed  active problem list, medication list, allergies, family history, social history, health maintenance, notes from last encounter, lab results, imaging with the patient/caregiver today.   Review of Systems  Constitutional: Negative.   HENT: Negative.    Eyes: Negative.   Respiratory: Negative.    Cardiovascular: Negative.   Gastrointestinal: Negative.   Endocrine: Negative.   Genitourinary: Negative.   Musculoskeletal: Negative.   Skin: Negative.   Allergic/Immunologic: Negative.   Neurological: Negative.   Hematological: Negative.   Psychiatric/Behavioral: Negative.    All other systems reviewed and are negative.      Objective:   Vitals:   05/15/24 1055  BP: 124/76  Pulse: 86  Resp: 16  SpO2: 97%  Weight: 166 lb (75.3 kg)   Height: 5' 2 (1.575 m)    Body mass index is 30.36 kg/m.  Physical Exam Vitals and nursing note reviewed.  Constitutional:      General: She is not in acute distress.    Appearance: Normal appearance. She is well-developed. She is not ill-appearing, toxic-appearing or diaphoretic.  HENT:     Head: Normocephalic and atraumatic.     Right Ear: External ear normal.     Left Ear: External ear normal.     Nose: Nose normal.   Eyes:     General: No scleral icterus.       Right eye: No discharge.        Left eye: No discharge.     Conjunctiva/sclera: Conjunctivae normal.   Neck:     Trachea: No tracheal deviation.   Cardiovascular:     Rate and Rhythm: Normal rate.     Pulses: Normal pulses.     Heart sounds: Normal heart sounds.  Pulmonary:     Effort: Pulmonary effort is normal. No respiratory distress.     Breath sounds: Normal breath sounds. No stridor. No wheezing, rhonchi or rales.   Skin:    General: Skin is warm and dry.     Findings: No rash.   Neurological:     Mental Status: She is alert.     Motor: No abnormal muscle tone.     Coordination: Coordination normal.     Gait: Gait normal.   Psychiatric:        Mood and Affect: Mood normal.        Behavior: Behavior normal.      Results for orders placed or performed in visit on 04/01/24  Hemoglobin A1c   Collection Time: 04/01/24  2:31 PM  Result Value Ref Range   Hgb A1c MFr Bld 5.8 (H) <5.7 %   Mean Plasma Glucose 120 mg/dL   eAG (mmol/L) 6.6 mmol/L  Comprehensive metabolic panel with GFR   Collection Time: 04/01/24  2:31 PM  Result Value Ref Range   Glucose, Bld 119 (H) 65 - 99 mg/dL   BUN 15 7 - 25 mg/dL   Creat 5.28 4.13 - 2.44 mg/dL   eGFR 90 > OR = 60 WN/UUV/2.53G6   BUN/Creatinine Ratio SEE NOTE: 6 - 22 (calc)   Sodium 140 135 - 146 mmol/L   Potassium 4.1 3.5 - 5.3 mmol/L   Chloride 103 98 - 110 mmol/L   CO2 30 20 - 32 mmol/L   Calcium  9.9 8.6 - 10.4 mg/dL   Total Protein 7.1 6.1 - 8.1  g/dL   Albumin 4.5 3.6 - 5.1 g/dL   Globulin 2.6 1.9 - 3.7 g/dL (calc)   AG Ratio 1.7 1.0 - 2.5 (calc)   Total Bilirubin  0.5 0.2 - 1.2 mg/dL   Alkaline phosphatase (APISO) 62 37 - 153 U/L   AST 19 10 - 35 U/L   ALT 19 6 - 29 U/L  CBC with Differential/Platelet   Collection Time: 04/01/24  2:31 PM  Result Value Ref Range   WBC 7.8 3.8 - 10.8 Thousand/uL   RBC 5.21 (H) 3.80 - 5.10 Million/uL   Hemoglobin 15.1 11.7 - 15.5 g/dL   HCT 14.7 (H) 82.9 - 56.2 %   MCV 87.1 80.0 - 100.0 fL   MCH 29.0 27.0 - 33.0 pg   MCHC 33.3 32.0 - 36.0 g/dL   RDW 13.0 86.5 - 78.4 %   Platelets 241 140 - 400 Thousand/uL   MPV 11.4 7.5 - 12.5 fL   Neutro Abs 4,766 1,500 - 7,800 cells/uL   Absolute Lymphocytes 2,192 850 - 3,900 cells/uL   Absolute Monocytes 686 200 - 950 cells/uL   Eosinophils Absolute 109 15 - 500 cells/uL   Basophils Absolute 47 0 - 200 cells/uL   Neutrophils Relative % 61.1 %   Total Lymphocyte 28.1 %   Monocytes Relative 8.8 %   Eosinophils Relative 1.4 %   Basophils Relative 0.6 %  Lipid panel   Collection Time: 04/01/24  2:31 PM  Result Value Ref Range   Cholesterol 191 <200 mg/dL   HDL 55 > OR = 50 mg/dL   Triglycerides 696 (H) <150 mg/dL   LDL Cholesterol (Calc) 109 (H) mg/dL (calc)   Total CHOL/HDL Ratio 3.5 <5.0 (calc)   Non-HDL Cholesterol (Calc) 136 (H) <130 mg/dL (calc)  TSH   Collection Time: 04/01/24  2:31 PM  Result Value Ref Range   TSH 0.82 0.40 - 4.50 mIU/L  T4, free   Collection Time: 04/01/24  2:31 PM  Result Value Ref Range   Free T4 1.3 0.8 - 1.8 ng/dL       Assessment & Plan:     ICD-10-CM   1. Mild intermittent asthma, unspecified whether complicated  J45.20 montelukast (SINGULAIR) 10 MG tablet    Albuterol-Budesonide (AIRSUPRA) 90-80 MCG/ACT AERO   Currently well-controlled, refilled her albuterol rescue inhaler, consider trying Airsupra, continue montelukast    2. Exercise induced bronchospasm  J45.990 albuterol (VENTOLIN HFA) 108 (90 Base)  MCG/ACT inhaler   Continue using albuterol inhaler prior to exercise to prevent exercise-induced bronchospasm    3. Class 1 obesity with body mass index (BMI) of 32.0 to 32.9 in adult, unspecified obesity type, unspecified whether serious comorbidity present  E66.811 ZEPBOUND  5 MG/0.5ML Pen   Z68.32    She has lost about 17 pounds BMI category has decreased at 30, currently on Wegovy  dose 0.50 and tolerating with mild side effects    4. Encounter for completion of form with patient  Z02.89    req accommodations letter be drafted       Letter to be drafted tomorrow in admin time - HR ABSS Maryella Smothers For accomodations re mold due to pt's dx/med hx Discussed req letter with Dr. Bud Care and Dr. Ava Lei today  Adeline Hone, PA-C 05/15/24 11:15 AM

## 2024-05-21 ENCOUNTER — Other Ambulatory Visit: Payer: Self-pay | Admitting: Dermatology

## 2024-05-21 DIAGNOSIS — L988 Other specified disorders of the skin and subcutaneous tissue: Secondary | ICD-10-CM

## 2024-05-22 ENCOUNTER — Telehealth: Payer: Self-pay

## 2024-05-22 NOTE — Telephone Encounter (Signed)
 Copied from CRM [id number]. Topic: General - Other >> May 22, 2024  2:48 PM Sophia H wrote: Reason for CRM: Patient is calling about an accomodation letter that PA Michelene was going to get ready for her. Do not see anything in the chart besides the office visit note. Patient is needing ASAP, please advise. Ty # [phone]

## 2024-05-22 NOTE — Telephone Encounter (Signed)
 Called and lvm informing patient the letter is ready for pick up at our front desk

## 2024-05-23 NOTE — Telephone Encounter (Unsigned)
 Copied from CRM [id number]. Topic: General - Other >> May 22, 2024  2:48 PM Sophia H wrote: Reason for CRM: Patient is calling about an accomodation letter that PA Michelene was going to get ready for her. Do not see anything in the chart besides the office visit note. Patient is needing ASAP, please advise. Ty # [phone] >> May 23, 2024  3:22 PM Donna BRAVO wrote: Patient calling in asking, Head of HR  has a form for Michelene Cower PA-C needs to fill out. [employer] and is requesting that the first 2 pages be filled out. Patient will drop off paper work after lunch sometime

## 2024-05-24 ENCOUNTER — Telehealth: Payer: Self-pay | Admitting: Family Medicine

## 2024-05-24 ENCOUNTER — Telehealth: Payer: Self-pay

## 2024-05-24 ENCOUNTER — Inpatient Hospital Stay: Payer: BC Managed Care – PPO | Attending: Oncology

## 2024-05-24 ENCOUNTER — Other Ambulatory Visit: Payer: Self-pay

## 2024-05-24 DIAGNOSIS — Z1501 Genetic susceptibility to malignant neoplasm of breast: Secondary | ICD-10-CM

## 2024-05-24 DIAGNOSIS — Z8543 Personal history of malignant neoplasm of ovary: Secondary | ICD-10-CM | POA: Diagnosis present

## 2024-05-24 LAB — CBC WITH DIFFERENTIAL (CANCER CENTER ONLY)
Abs Immature Granulocytes: 0.02 10*3/uL (ref 0.00–0.07)
Basophils Absolute: 0.1 10*3/uL (ref 0.0–0.1)
Basophils Relative: 1 %
Eosinophils Absolute: 0.1 10*3/uL (ref 0.0–0.5)
Eosinophils Relative: 2 %
HCT: 46.5 % — ABNORMAL HIGH (ref 36.0–46.0)
Hemoglobin: 15.4 g/dL — ABNORMAL HIGH (ref 12.0–15.0)
Immature Granulocytes: 0 %
Lymphocytes Relative: 31 %
Lymphs Abs: 2.2 10*3/uL (ref 0.7–4.0)
MCH: 28.9 pg (ref 26.0–34.0)
MCHC: 33.1 g/dL (ref 30.0–36.0)
MCV: 87.2 fL (ref 80.0–100.0)
Monocytes Absolute: 0.5 10*3/uL (ref 0.1–1.0)
Monocytes Relative: 7 %
Neutro Abs: 4.2 10*3/uL (ref 1.7–7.7)
Neutrophils Relative %: 59 %
Platelet Count: 231 10*3/uL (ref 150–400)
RBC: 5.33 MIL/uL — ABNORMAL HIGH (ref 3.87–5.11)
RDW: 13.1 % (ref 11.5–15.5)
WBC Count: 7 10*3/uL (ref 4.0–10.5)
nRBC: 0 % (ref 0.0–0.2)

## 2024-05-24 LAB — CMP (CANCER CENTER ONLY)
ALT: 20 U/L (ref 0–44)
AST: 22 U/L (ref 15–41)
Albumin: 4.4 g/dL (ref 3.5–5.0)
Alkaline Phosphatase: 59 U/L (ref 38–126)
Anion gap: 9 (ref 5–15)
BUN: 15 mg/dL (ref 8–23)
CO2: 24 mmol/L (ref 22–32)
Calcium: 9.2 mg/dL (ref 8.9–10.3)
Chloride: 103 mmol/L (ref 98–111)
Creatinine: 0.69 mg/dL (ref 0.44–1.00)
GFR, Estimated: >60 mL/min (ref >60)
Glucose, Bld: 115 mg/dL — ABNORMAL HIGH (ref 70–99)
Potassium: 3.3 mmol/L — ABNORMAL LOW (ref 3.5–5.1)
Sodium: 136 mmol/L (ref 135–145)
Total Bilirubin: 1.1 mg/dL (ref 0.0–1.2)
Total Protein: 7.3 g/dL (ref 6.5–8.1)

## 2024-05-24 NOTE — Telephone Encounter (Signed)
 FYI: pt dropped off paperwork for Leisa to fill out form [employer]. She is asking that we give her a call once completed [phone]. It has been placed in Leisa pink folder upfront.

## 2024-05-27 LAB — CA 125: Cancer Antigen (CA) 125: 7.9 U/mL (ref 0.0–38.1)

## 2024-05-27 NOTE — Telephone Encounter (Signed)
 Error

## 2024-05-28 NOTE — Telephone Encounter (Signed)
 Lvm letting pt know that paperwork is ready for pickup and that the nurse did fax it to the school. She is more than welcome to pick up her copy. We are here from 7:30-5pm and closed for lunch from 12-1pm. And we will be closed on this coming Friday for the 4th of July.

## 2024-05-29 ENCOUNTER — Ambulatory Visit: Payer: BC Managed Care – PPO | Admitting: Oncology

## 2024-06-12 ENCOUNTER — Ambulatory Visit
Admission: RE | Admit: 2024-06-12 | Discharge: 2024-06-12 | Disposition: A | Discharge status: Home / Self Care | Source: Ambulatory Visit | Attending: Oncology | Admitting: Oncology

## 2024-06-12 DIAGNOSIS — Z1502 Genetic susceptibility to malignant neoplasm of ovary: Secondary | ICD-10-CM | POA: Diagnosis present

## 2024-06-12 DIAGNOSIS — Z1231 Encounter for screening mammogram for malignant neoplasm of breast: Secondary | ICD-10-CM | POA: Diagnosis not present

## 2024-06-12 DIAGNOSIS — Z1509 Genetic susceptibility to other malignant neoplasm: Secondary | ICD-10-CM | POA: Insufficient documentation

## 2024-06-12 DIAGNOSIS — Z1501 Genetic susceptibility to malignant neoplasm of breast: Secondary | ICD-10-CM | POA: Insufficient documentation

## 2024-06-18 ENCOUNTER — Inpatient Hospital Stay: Payer: Self-pay | Attending: Oncology | Admitting: Oncology

## 2024-06-18 ENCOUNTER — Encounter: Payer: Self-pay | Admitting: Oncology

## 2024-06-18 VITALS — BP 124/76 | HR 75 | Temp 97.8°F | Resp 16 | Wt 159.0 lb

## 2024-06-18 DIAGNOSIS — Z1502 Genetic susceptibility to malignant neoplasm of ovary: Secondary | ICD-10-CM

## 2024-06-18 DIAGNOSIS — Z1501 Genetic susceptibility to malignant neoplasm of breast: Secondary | ICD-10-CM | POA: Diagnosis not present

## 2024-06-18 DIAGNOSIS — Z90722 Acquired absence of ovaries, bilateral: Secondary | ICD-10-CM | POA: Diagnosis not present

## 2024-06-18 DIAGNOSIS — Z90710 Acquired absence of both cervix and uterus: Secondary | ICD-10-CM | POA: Diagnosis not present

## 2024-06-18 DIAGNOSIS — Z87891 Personal history of nicotine dependence: Secondary | ICD-10-CM | POA: Insufficient documentation

## 2024-06-18 DIAGNOSIS — Z808 Family history of malignant neoplasm of other organs or systems: Secondary | ICD-10-CM | POA: Insufficient documentation

## 2024-06-18 DIAGNOSIS — Z800 Family history of malignant neoplasm of digestive organs: Secondary | ICD-10-CM | POA: Diagnosis not present

## 2024-06-18 DIAGNOSIS — Z803 Family history of malignant neoplasm of breast: Secondary | ICD-10-CM | POA: Diagnosis not present

## 2024-06-18 DIAGNOSIS — Z8543 Personal history of malignant neoplasm of ovary: Secondary | ICD-10-CM | POA: Diagnosis not present

## 2024-06-18 DIAGNOSIS — Z1509 Genetic susceptibility to other malignant neoplasm: Secondary | ICD-10-CM

## 2024-06-18 DIAGNOSIS — Z9079 Acquired absence of other genital organ(s): Secondary | ICD-10-CM | POA: Insufficient documentation

## 2024-06-18 DIAGNOSIS — G4730 Sleep apnea, unspecified: Secondary | ICD-10-CM | POA: Insufficient documentation

## 2024-06-18 DIAGNOSIS — D751 Secondary polycythemia: Secondary | ICD-10-CM | POA: Insufficient documentation

## 2024-06-18 DIAGNOSIS — Z9189 Other specified personal risk factors, not elsewhere classified: Secondary | ICD-10-CM | POA: Diagnosis present

## 2024-06-18 MED ORDER — POTASSIUM CHLORIDE CRYS ER 20 MEQ PO TBCR: 20.0000 meq | EXTENDED_RELEASE_TABLET | Freq: Two times a day (BID) | ORAL | 0 refills | Status: DC

## 2024-06-18 NOTE — Progress Notes (Signed)
 Hematology/Oncology Progress note [phone] [phone]      Patient Care Team: Leavy Mole, PA-C as PCP - General (Family Medicine) Maurie Rayfield BIRCH, RN as Oncology Nurse Navigator Babara Call, MD as Consulting Physician (Oncology)  ASSESSMENT & PLAN:   BRCA1 gene mutation positive in female Recommend bilateral diagnostic mammogram annually alternate with breast MRI bilaterally annually. Obtain bilateral screen mammogram asap-  Dec 2024 breast MRI bilaterally   No family history of pancreatic caner- screen MRI abdomen was not approved by insurance previosuly  Erythrocytosis She has sleep apnea, reports to be compliant with CPAP  JAK2 V617F mutation negative, with reflex to other mutations CALR, MPL, JAK 2 Ex 12-15 mutations negative. negative Bcr Abl1 No need for phlebotomy for now.  Continue observation.    History of ovarian cancer history of stage III left ovarian cancer-2006 CA 125  is WNL  Continue surveillance. - she lost follow up with Gynonc. Recommend re establish care  Orders Placed This Encounter  Procedures   MR BREAST BILATERAL W WO CONTRAST INC CAD    Standing Status:   Future    Expected Date:   12/02/2024    Expiration Date:   06/12/2025    If indicated for the ordered procedure, I authorize the administration of contrast media per Radiology protocol:   Yes    What is the patient's sedation requirement?:   No Sedation    Does the patient have a pacemaker or implanted devices?:   No    Radiology Contrast Protocol - do NOT remove file path:   [email or web address]    Preferred imaging location?:   [facility] (table limit - 500lbs)   MM 3D SCREENING MAMMOGRAM BILATERAL BREAST    Standing Status:   Future    Expected Date:   06/18/2025    Expiration Date:   06/18/2025    Reason for Exam (SYMPTOM  OR DIAGNOSIS REQUIRED):   Treatment if labs meet parameters    Preferred imaging location?:    [facility]   CBC with Differential (Cancer Center Only)    Standing Status:   Future    Expected Date:   06/18/2025    Expiration Date:   06/18/2025   CMP (Cancer Center only)    Standing Status:   Future    Expected Date:   06/18/2025    Expiration Date:   06/18/2025   CA 125    Standing Status:   Future    Expected Date:   06/18/2025    Expiration Date:   06/18/2025   Follow up in 1 year. All questions were answered. The patient knows to call the clinic with any problems, questions or concerns.  Call Babara, MD, PhD [facility] Hematology Oncology 06/18/2024    CHIEF COMPLAINTS/REASON FOR VISIT:  Follow up for high risk for breast cancer, BRCA-1 mutation.   HISTORY OF PRESENTING ILLNESS:   Joyce Decker is a  68 y.o.  female with PMH listed below was seen in consultation at the request of  Emilio Kelly DASEN, FNP  for evaluation of high risk for breast cancer, BRCA mutation.   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  patient underwent surgery, postoperatively received adjuvant therapy with cisplatin and paclitaxel, intraperitoneal and IV therapy.  Patient has been in complete remission since then. Patient lost follow-up for a few years and recently reestablished care with Dr. Elby.  Per patient, patient was tested positive for BRCA1 mutation and she was sent to establish care for high risk breast cancer clinic. Last mammogram was done in August 2017.  Patient has no complaints today.  Patient denies any breast concerns.  Patient reports that one of her son was also tested positive for BRCA1 mutation.  Her younger sons have not been tested yet.  BRCA1 mutation report was scanned to EMR.  INTERVAL HISTORY Joyce Decker is a 68 y.o. female who has above history reviewed by me today presents for follow up visit for management of  history of ovarian cancer, BRCA1 mutation. No new complains.  She uses CPAP machine every day. Per patient she has CPAP serviced, and all parameters were normal. She has no new breast concerns.   Review of Systems  Constitutional:  Negative for appetite change, chills, fatigue and fever.  HENT:   Negative for hearing loss and voice change.   Eyes:  Negative for eye problems.  Respiratory:  Negative for chest tightness and cough.   Cardiovascular:  Negative for chest pain.  Gastrointestinal:  Negative for abdominal distention, abdominal pain and blood in stool.  Endocrine: Negative for hot flashes.  Genitourinary:  Negative for difficulty urinating and frequency.   Musculoskeletal:  Negative for arthralgias.  Skin:  Negative for itching and rash.  Neurological:  Negative for extremity weakness.  Hematological:  Negative for adenopathy.  Psychiatric/Behavioral:  Negative for confusion.     MEDICAL HISTORY:  Past Medical History:  Diagnosis Date   Asthma    BRCA1 gene mutation positive    Cancer (HCC) 2006   ovarian cancer 2006   Gestational diabetes    Hyperthyroidism    Hypothyroidism    Kidney stones    Personal history of chemotherapy 2006   ovarian ca   Sleep apnea    CPAP    SURGICAL HISTORY: Past Surgical History:  Procedure Laterality Date   ABDOMINAL HYSTERECTOMY Bilateral    AND BSO   COLONOSCOPY WITH PROPOFOL  N/A 07/03/2015   Procedure: COLONOSCOPY WITH PROPOFOL ;  Surgeon: Donnice Vaughn Manes, MD;  Location: [facility] ENDOSCOPY;  Service: Endoscopy;  Laterality: N/A;   COLONOSCOPY WITH PROPOFOL  N/A 10/20/2021   Procedure: COLONOSCOPY WITH PROPOFOL ;  Surgeon: Toledo, Ladell POUR, MD;  Location: [facility] ENDOSCOPY;  Service: Gastroenterology;  Laterality: N/A;   Dental Implant     ESOPHAGOGASTRODUODENOSCOPY N/A 10/20/2021   Procedure: ESOPHAGOGASTRODUODENOSCOPY (EGD);  Surgeon: Toledo, Ladell POUR, MD;  Location: [facility] ENDOSCOPY;  Service: Gastroenterology;  Laterality: N/A;    OOPHORECTOMY     TONSILLECTOMY AND ADENOIDECTOMY      SOCIAL HISTORY: Social History   Socioeconomic History   Marital status: Married    Spouse name: Not on file   Number of children: Not on file   Years of education: Not on file   Highest education level: Bachelor's degree (e.g., BA, AB, BS)  Occupational History   Not on file  Tobacco Use   Smoking status: Former    Current packs/day: 0.50    Types: Cigarettes   Smokeless tobacco: Never   Tobacco comments:    35 years ago  Vaping Use   Vaping status: Never Used  Substance and Sexual Activity   Alcohol use: No   Drug use: No   Sexual activity: Yes  Other Topics Concern   Not on file  Social History Narrative   Not on file   Social Drivers of Health   Financial Resource Strain: Low Risk  (05/14/2024)  Overall Financial Resource Strain (CARDIA)    Difficulty of Paying Living Expenses: Not very hard  Food Insecurity: No Food Insecurity (05/14/2024)   Hunger Vital Sign    Worried About Running Out of Food in the Last Year: Never true    Ran Out of Food in the Last Year: Never true  Transportation Needs: No Transportation Needs (05/14/2024)   PRAPARE - Administrator, Civil Service (Medical): No    Lack of Transportation (Non-Medical): No  Physical Activity: Insufficiently Active (05/14/2024)   Exercise Vital Sign    Days of Exercise per Week: 3 days    Minutes of Exercise per Session: 20 min  Stress: Stress Concern Present (05/14/2024)   [employer] of Occupational Health - Occupational Stress Questionnaire    Feeling of Stress: Very much  Social Connections: Socially Integrated (05/14/2024)   Social Connection and Isolation Panel    Frequency of Communication with Friends and Family: More than three times a week    Frequency of Social Gatherings with Friends and Family: More than three times a week    Attends Religious Services: More than 4 times per year    Active Member of [employer] or Organizations:  Yes    Attends Engineer, structural: 1 to 4 times per year    Marital Status: Married  Catering manager Violence: Not on file    FAMILY HISTORY: Family History  Problem Relation Age of Onset   Diabetes Mother    Hypertension Mother    Diabetes Maternal Aunt    Heart disease Maternal Aunt    Hypertension Maternal Aunt    Diabetes Maternal Uncle    Heart disease Maternal Uncle    Hypertension Maternal Uncle    Colon cancer Maternal Uncle    Brain cancer Maternal Grandmother    Diabetes Cousin    Breast cancer Cousin    Melanoma Other    Stomach cancer Other    Prostate cancer Maternal Uncle    Prostate cancer Maternal Uncle    Breast cancer Cousin    Breast cancer Cousin    Breast cancer Cousin    BRCA 1/2 Son    Breast cancer Cousin    Breast cancer Cousin     ALLERGIES:  is allergic to other, tape, gadobenate, and iodinated contrast media.  MEDICATIONS:  Current Outpatient Medications  Medication Sig Dispense Refill   albuterol  (VENTOLIN  HFA) 108 (90 Base) MCG/ACT inhaler Inhale 2 puffs into the lungs every 4 (four) hours as needed for wheezing or shortness of breath (asthma exacerbation and exercise induced bronchospasm (use before exercise)). 1 each 5   Albuterol -Budesonide (AIRSUPRA ) 90-80 MCG/ACT AERO Inhale 2 puffs into the lungs every 4 (four) hours as needed (asthma sx). Rinse out mouth after each use (swish and spit) 10.7 g 2   montelukast  (SINGULAIR ) 10 MG tablet Take 1 tablet (10 mg total) by mouth at bedtime. 90 tablet 3   potassium chloride  SA (KLOR-CON  M) 20 MEQ tablet Take 1 tablet (20 mEq total) by mouth 2 (two) times daily. 3 tablet 0   tretinoin  (RETIN-A ) 0.05 % cream APPLY TO AFFECTED AREA EVERY DAY AT BEDTIME 45 g 1   valACYclovir  (VALTREX ) 1000 MG tablet TAKE 2 TABLETS A FIRST SIGN OF FLARE REPEAT IN 12 HOURS 90 tablet 3   ZEPBOUND  5 MG/0.5ML Pen Inject 5 mg into the skin once a week.     No current facility-administered medications for this  visit.     PHYSICAL EXAMINATION:  ECOG PERFORMANCE STATUS: 0 - Asymptomatic Vitals:   06/18/24 1404  BP: 124/76  Pulse: 75  Resp: 16  Temp: 97.8 F (36.6 C)  SpO2: 98%   Filed Weights   06/18/24 1404  Weight: 159 lb (72.1 kg)    Physical Exam Constitutional:      General: She is not in acute distress. HENT:     Head: Normocephalic and atraumatic.  Eyes:     General: No scleral icterus. Cardiovascular:     Rate and Rhythm: Normal rate and regular rhythm.     Heart sounds: Normal heart sounds.  Pulmonary:     Effort: Pulmonary effort is normal. No respiratory distress.     Breath sounds: No wheezing.  Abdominal:     General: Bowel sounds are normal. There is no distension.     Palpations: Abdomen is soft.  Musculoskeletal:        General: No deformity. Normal range of motion.     Cervical back: Normal range of motion and neck supple.  Skin:    General: Skin is warm and dry.     Findings: No erythema or rash.  Neurological:     Mental Status: She is alert and oriented to person, place, and time. Mental status is at baseline.     Cranial Nerves: No cranial nerve deficit.     Coordination: Coordination normal.  Psychiatric:        Mood and Affect: Mood normal.     LABORATORY DATA:  I have reviewed the data as listed Lab Results  Component Value Date   WBC 7.0 05/24/2024   HGB 15.4 (H) 05/24/2024   HCT 46.5 (H) 05/24/2024   MCV 87.2 05/24/2024   PLT 231 05/24/2024   Recent Labs    04/01/24 1431 05/24/24 1510  NA 140 136  K 4.1 3.3*  CL 103 103  CO2 30 24  GLUCOSE 119* 115*  BUN 15 15  CREATININE 0.74 0.69  CALCIUM  9.9 9.2  GFRNONAA  --  >60  PROT 7.1 7.3  ALBUMIN  --  4.4  AST 19 22  ALT 19 20  ALKPHOS  --  59  BILITOT 0.5 1.1   RADIOGRAPHIC STUDIES: I have personally reviewed the radiological images as listed and agreed with the findings in the report.  MM 3D SCREENING MAMMOGRAM BILATERAL BREAST Result Date: 06/17/2024 CLINICAL DATA:   Screening. EXAM: DIGITAL SCREENING BILATERAL MAMMOGRAM WITH TOMOSYNTHESIS AND CAD TECHNIQUE: Bilateral screening digital craniocaudal and mediolateral oblique mammograms were obtained. Bilateral screening digital breast tomosynthesis was performed. The images were evaluated with computer-aided detection. COMPARISON:  Previous exam(s). ACR Breast Density Category b: There are scattered areas of fibroglandular density. FINDINGS: There are no findings suspicious for malignancy. IMPRESSION: No mammographic evidence of malignancy. A result letter of this screening mammogram will be mailed directly to the patient. RECOMMENDATION: Screening mammogram in one year. (Code:SM-B-01Y) BI-RADS CATEGORY  1: Negative. Electronically Signed   By: Reyes Phi M.D.   On: 06/17/2024 12:05

## 2024-06-18 NOTE — Assessment & Plan Note (Signed)
Recommend bilateral diagnostic mammogram annually alternate with breast MRI bilaterally annually. Obtain bilateral screen mammogram asap-  Dec 2024 breast MRI bilaterally   No family history of pancreatic caner- screen MRI abdomen was not approved by insurance previosuly

## 2024-06-18 NOTE — Assessment & Plan Note (Addendum)
 history of stage III left ovarian cancer-2006 CA 125  is WNL  Continue surveillance. - she lost follow up with Gynonc. Recommend re establish care

## 2024-06-18 NOTE — Assessment & Plan Note (Signed)
 She has sleep apnea, reports to be compliant with CPAP  JAK2 V617F mutation negative, with reflex to other mutations CALR, MPL, JAK 2 Ex 12-15 mutations negative. negative Bcr Abl1 No need for phlebotomy for now.  Continue observation.

## 2024-07-02 ENCOUNTER — Other Ambulatory Visit: Payer: Self-pay | Admitting: Family Medicine

## 2024-07-02 DIAGNOSIS — E66811 Obesity, class 1: Secondary | ICD-10-CM

## 2024-07-03 ENCOUNTER — Inpatient Hospital Stay: Attending: Oncology

## 2024-07-03 ENCOUNTER — Other Ambulatory Visit: Payer: Self-pay

## 2024-07-03 DIAGNOSIS — E66811 Obesity, class 1: Secondary | ICD-10-CM

## 2024-07-03 MED ORDER — ZEPBOUND 5 MG/0.5ML ~~LOC~~ SOAJ
5.0000 mg | SUBCUTANEOUS | 0 refills | Status: DC
Start: 2024-07-03 — End: 2024-07-31

## 2024-07-03 NOTE — Telephone Encounter (Unsigned)
 Copied from CRM #[id number]. Topic: Clinical - Medication Question >> Jul 03, 2024  4:25 PM Marissa P wrote: Reason for CRM: Patient called in to let us  know that the ZEPBOUND  5 MG/0.5ML [facility] and [facility] to [facility] [address] - [place], [place] - [address] [facility] AT [facility] [address] [place] [address] Phone: [phone] Fax: [phone] Hours: Not open 24 hours

## 2024-07-04 NOTE — Telephone Encounter (Signed)
 Requested medication (s) are due for refill today: no  Requested medication (s) are on the active medication list: yes  Last refill:  07/03/24 2 ml  Future visit scheduled: no  Notes to clinic:  reordered 07/03/24 2 ml (NT not delegated to NT, med. Not assigned to a protocol.   Requested Prescriptions  Pending Prescriptions Disp Refills   ZEPBOUND  5 MG/0.5ML Pen [Pharmacy Med Name: ZEPBOUND  5 MG/0.5 ML PEN[R^]] 2 mL 1    Sig: INJECT THE CONTENTS OF ONE PEN UNDER THE SKIN ONCE WEEKLY ON THE SAME DAY EACH WEEK.     Off-Protocol Failed - 07/04/2024 11:34 AM      Failed - Medication not assigned to a protocol, review manually.      Passed - Valid encounter within last 12 months    Recent Outpatient Visits           1 month ago Mild intermittent asthma, unspecified whether complicated   [facility] Leavy Mole, PA-C   3 months ago Encounter for medical examination to establish care   [facility] Leavy Mole, [place]   4 months ago Jaw pain   [facility] Bernardo Fend, [place]

## 2024-07-31 ENCOUNTER — Other Ambulatory Visit: Payer: Self-pay | Admitting: Family Medicine

## 2024-07-31 DIAGNOSIS — E66811 Obesity, class 1: Secondary | ICD-10-CM

## 2024-07-31 MED ORDER — ZEPBOUND 5 MG/0.5ML ~~LOC~~ SOAJ: 5.0000 mg | SUBCUTANEOUS | 0 refills | Status: DC

## 2024-07-31 NOTE — Telephone Encounter (Signed)
 Copied from CRM [id number]. Topic: Clinical - Medication Refill >> Jul 31, 2024  3:16 PM Shardie S wrote: Medication: ZEPBOUND  5 MG/0.5ML Pen, requesting a 73 da supply   Has the patient contacted their pharmacy? Yes (Agent: If no, request that the patient contact the pharmacy for the refill. If patient does not wish to contact the pharmacy document the reason why and proceed with request.) (Agent: If yes, when and what did the pharmacy advise?)  This is the patient's preferred pharmacy:    [facility] [address] - [place], [place] - [address] [facility] AT [facility] [address] [place] [address] Phone: [phone] Fax: [phone]  Is this the correct pharmacy for this prescription? Yes If no, delete pharmacy and type the correct one.   Has the prescription been filled recently? No  Is the patient out of the medication? Yes  Has the patient been seen for an appointment in the last year OR does the patient have an upcoming appointment? Yes  Can we respond through MyChart? Yes  Agent: Please be advised that Rx refills may take up to 3 business days. We ask that you follow-up with your pharmacy.

## 2024-08-01 NOTE — Telephone Encounter (Signed)
 Requested Prescriptions  Refused Prescriptions Disp Refills   ZEPBOUND  5 MG/0.5ML Pen [Pharmacy Med Name: ZEPBOUND  5 MG/0.5 ML PEN[R^]] 2 mL 0    Sig: INJECT THE CONTENTS OF ONE PEN UNDER THE SKIN ONCE WEEKLY ON THE SAME DAY EACH WEEK.     Off-Protocol Failed - 08/01/2024 12:07 PM      Failed - Medication not assigned to a protocol, review manually.      Passed - Valid encounter within last 12 months    Recent Outpatient Visits           2 months ago Mild intermittent asthma, unspecified whether complicated   [facility] Leavy Mole, [facility]-C   4 months ago Encounter for medical examination to establish care   [facility] Leavy Mole, [place]   5 months ago Jaw pain   [facility] Bernardo Fend, [place]

## 2024-08-05 ENCOUNTER — Other Ambulatory Visit: Payer: Self-pay | Admitting: Family Medicine

## 2024-08-05 DIAGNOSIS — Z6832 Body mass index (BMI) 32.0-32.9, adult: Secondary | ICD-10-CM

## 2024-08-05 NOTE — Telephone Encounter (Unsigned)
 Copied from CRM [id number]. Topic: Clinical - Prescription Issue >> Aug 05, 2024 11:37 AM Joyce Decker wrote: Reason for CRM: Patient called regarding an issue with her medication Zepbound  5 mg/0.5 mL pen. She stated that the prescription keeps being sent to the wrong preferred pharmacy.  Pt stated this Rx medication was originally filled at [facility], which is her preferred pharmacy for this medication. And also, [facility] but [facility] as [facility] often [facility] not have the [facility] available.Patient requests [facility] be used for this medication moving forward for this medication.Patient is also requesting that her provider supply at least a few months' supply at a time, rather than just one month, if possible.   Patient also stated that a prescription with a start date of 9/3 [facility]. She would like to know if this can be expedited to [facility] instead, as she has not had her pen for the last two weeks.    Preferred Pharmacy  [facility] [address] - [place], [place] - [address] [facility] [address] [place] [address] Phone: [phone] Fax: [phone]

## 2024-08-05 NOTE — Telephone Encounter (Signed)
 Requested medication (s) are due for refill today: Yes  Requested medication (s) are on the active medication list: Yes  Last refill:  07/31/24  Future visit scheduled: Yes  Notes to clinic:  Unable to refill due to no refill protocol for this medication.Resend to a different pharmacy today please, being sent to the wrong pharmacy.       Requested Prescriptions  Pending Prescriptions Disp Refills   ZEPBOUND  5 MG/0.5ML Pen 2 mL 0    Sig: Inject 5 mg into the skin once a week.     Off-Protocol Failed - 08/05/2024 12:26 PM      Failed - Medication not assigned to a protocol, review manually.      Passed - Valid encounter within last 12 months    Recent Outpatient Visits           2 months ago Mild intermittent asthma, unspecified whether complicated   [facility] Leavy Mole, PA-C   4 months ago Encounter for medical examination to establish care   [facility] Leavy Mole, [place]   5 months ago Jaw pain   [facility] Bernardo Fend, [place]

## 2024-08-30 ENCOUNTER — Other Ambulatory Visit: Payer: Self-pay | Admitting: Family Medicine

## 2024-08-30 DIAGNOSIS — E66811 Obesity, class 1: Secondary | ICD-10-CM

## 2024-09-02 NOTE — Telephone Encounter (Signed)
 Requested medication (s) are due for refill today: yes  Requested medication (s) are on the active medication list: yes  Last refill:  07/31/24  Future visit scheduled: yes  Notes to clinic:  Medication not assigned to a protocol, review manually.      Requested Prescriptions  Pending Prescriptions Disp Refills   ZEPBOUND  5 MG/0.5ML Pen [Pharmacy Med Name: ZEPBOUND  5 MG/0.5 ML PEN[R^]] 2 mL 0    Sig: INJECT THE CONTENTS OF ONE PEN UNDER THE SKIN ONCE WEEKLY ON THE SAME DAY EACH WEEK.     Off-Protocol Failed - 09/02/2024 11:49 AM      Failed - Medication not assigned to a protocol, review manually.      Passed - Valid encounter within last 12 months    Recent Outpatient Visits           3 months ago Mild intermittent asthma, unspecified whether complicated   [facility] Leavy Mole, PA-C   5 months ago Encounter for medical examination to establish care   [facility] Leavy Mole, [facility]-C   6 months ago Jaw pain   [facility] Bernardo Fend, [place]

## 2024-09-02 NOTE — Telephone Encounter (Signed)
 Pt needs f/u appt

## 2024-09-11 ENCOUNTER — Telehealth: Payer: Self-pay

## 2024-09-11 NOTE — Telephone Encounter (Signed)
Can discuss at next appt

## 2024-09-11 NOTE — Telephone Encounter (Signed)
 Copied from CRM [id number]. Topic: Clinical - Medication Question >> Sep 11, 2024  1:22 PM Joyce Decker wrote: Reason for CRM: Patient called.. wants to know if she can get Rimegepant Sulfate (NURTEC) 75 MG started again.. for as needed for her migraines if she gets them .[name] [facility]

## 2024-09-24 ENCOUNTER — Ambulatory Visit: Admitting: Internal Medicine

## 2024-09-24 ENCOUNTER — Ambulatory Visit: Admitting: Family Medicine

## 2024-11-05 ENCOUNTER — Other Ambulatory Visit: Payer: Self-pay | Admitting: Family Medicine

## 2024-11-05 DIAGNOSIS — E66811 Obesity, class 1: Secondary | ICD-10-CM

## 2024-11-07 NOTE — Telephone Encounter (Signed)
 Requested medication (s) are due for refill todayYes  Requested medication (s) are on the active medication list: yes  Last refill:    Future visit scheduled: no  Notes to clinic:  Manual review.    Requested Prescriptions  Pending Prescriptions Disp Refills   ZEPBOUND  5 MG/0.5ML Pen [Pharmacy Med Name: ZEPBOUND  5 MG/0.5 ML PEN[R^]] 2 mL 0    Sig: INJECT THE CONTENTS OF ONE PEN UNDER THE SKIN ONCE WEEKLY ON THE SAME DAY EACH WEEK.     Off-Protocol Failed - 11/07/2024  1:36 PM      Failed - Medication not assigned to a protocol, review manually.      Passed - Valid encounter within last 12 months    Recent Outpatient Visits           5 months ago Mild intermittent asthma, unspecified whether complicated   [facility] Leavy Mole, PA-C   7 months ago Encounter for medical examination to establish care   [facility] Leavy Mole, [place]   8 months ago Jaw pain   [facility] Bernardo Fend, [place]

## 2024-11-12 ENCOUNTER — Ambulatory Visit: Payer: Self-pay | Admitting: Family Medicine

## 2024-12-03 ENCOUNTER — Telehealth: Payer: Self-pay | Admitting: Family Medicine

## 2024-12-03 ENCOUNTER — Other Ambulatory Visit: Payer: Self-pay | Admitting: Family Medicine

## 2024-12-03 DIAGNOSIS — E66811 Obesity, class 1: Secondary | ICD-10-CM

## 2024-12-03 NOTE — Telephone Encounter (Unsigned)
 Copied from CRM #[id number]. Topic: General - Other >> Dec 03, 2024  2:10 PM Tiffini S wrote: Reason for CRM: Patient is transferring from [facility] to Dr. Marcine Essex- have taking all of the pens for ZEPBOUND  5 MG/0.5ML Pen and is currently out of the medication/ starting to gain weight back- asked for a medication refill sent to    [facility] [address]  [address] [place] [address] Phone: [phone] Fax: [phone] Hours: Not open 24 hours  Scheduled appointment for 03/12/25 for next available appointment and asked for a call back to discuss at [phone]

## 2024-12-03 NOTE — Telephone Encounter (Unsigned)
 Copied from CRM #[id number]. Topic: Clinical - Medication Refill >> Dec 03, 2024  2:19 PM Tiffini S wrote: Medication: ZEPBOUND  5 MG/0.5ML Pen  Has the patient contacted their pharmacy? No (Agent: If no, request that the patient contact the pharmacy for the refill. If patient does not wish to contact the pharmacy document the reason why and proceed with request.) (Agent: If yes, when and what did the pharmacy advise?)  This is the patient's preferred pharmacy:     [facility] [address] - [place], [place] - [address] [facility] AT [facility] [address] [place] [address] Phone: [phone] Fax: [phone]  Is this the correct pharmacy for this prescription? Yes If no, delete pharmacy and type the correct one.   Has the prescription been filled recently? Yes  Is the patient out of the medication? Yes  Has the patient been seen for an appointment in the last year OR does the patient have an upcoming appointment? Yes  Can we respond through MyChart? Yes  Agent: Please be advised that Rx refills may take up to 3 business days. We ask that you follow-up with your pharmacy.

## 2024-12-04 NOTE — Telephone Encounter (Signed)
 Pt called back to report that she is completely out of her current supply. She says she needs this by tomorrow. [facility] does not have this at the moment, they can get it if is ordered by tomorrow because they cannot order it or get it from another store on Fridays or the weekend.

## 2024-12-06 ENCOUNTER — Telehealth: Payer: Self-pay

## 2024-12-06 DIAGNOSIS — E66811 Obesity, class 1: Secondary | ICD-10-CM

## 2024-12-06 MED ORDER — ZEPBOUND 5 MG/0.5ML ~~LOC~~ SOAJ: 5.0000 mg | SUBCUTANEOUS | 0 refills | Status: DC

## 2024-12-06 NOTE — Telephone Encounter (Signed)
 Copied from CRM [id number]. Topic: Clinical - Medication Question >> Dec 05, 2024  4:50 PM Sophia H wrote: Reason for CRM: Was a patient of Michelene Cower and is needing a fill of ZEPBOUND  5 MG/0.5ML Pen. Has a TOC scheduled to see Dr. Sharma but cannot get meds prescribed till establishing care.   Needing provider at [facility] to send in medication to get her to [facility] on 03/12/2025.    [facility] [address] - [place],  - [address]

## 2024-12-06 NOTE — Telephone Encounter (Signed)
 How should we handle this?she has not been seen since 6 months?

## 2024-12-10 ENCOUNTER — Telehealth: Payer: Self-pay

## 2024-12-10 NOTE — Telephone Encounter (Signed)
 Copied from CRM #[id number]. Topic: Clinical - Medication Question >> Dec 10, 2024 12:22 PM Joesph B wrote: Reason for CRM: Patient would like to know if her pcp can send over zepbound  single dose vials to [facility]?  She states zepbound  pen needles are too expensive now that she has retired.  If the vials cannot be sent,  she would like Semaglutide  in pill form sent.. Please fu with patient.

## 2024-12-10 NOTE — Telephone Encounter (Signed)
 Lvm asking pt to schedule appt with Brittany

## 2024-12-17 ENCOUNTER — Other Ambulatory Visit: Payer: Self-pay | Admitting: Family Medicine

## 2024-12-17 ENCOUNTER — Telehealth: Payer: Self-pay

## 2024-12-17 DIAGNOSIS — Z6832 Body mass index (BMI) 32.0-32.9, adult: Secondary | ICD-10-CM

## 2024-12-17 MED ORDER — ZEPBOUND 5 MG/0.5ML ~~LOC~~ SOAJ: 5.0000 mg | SUBCUTANEOUS | 0 refills | Status: DC

## 2024-12-17 NOTE — Telephone Encounter (Signed)
 Copied from CRM [id number]. Topic: Clinical - Medication Question >> Dec 16, 2024  1:46 PM Delon T wrote: Reason for CRM: tirzepatide  (ZEPBOUND ) 5 MG/0.5ML Pen Patient is trying to get a refill on this medication and wants to go through [facility] for the vials- unable to get sooner appt at either [place] or [place], please call [phone]

## 2024-12-17 NOTE — Telephone Encounter (Signed)
 Copied from CRM [id number]. Topic: Appointments - Scheduling Inquiry for Clinic >> Dec 17, 2024  1:58 PM Joyce Decker wrote: Reason for CRM: Pt has scheduled an office visit with Dr. Glenard 12/25/2024 to get her approved medication sent to [facility] so she can afford it. This is regarding her Zepbound . She has a TOC with [facility] 03/12/2025.   CAL approved this OV.

## 2024-12-17 NOTE — Telephone Encounter (Signed)
 Pt is scheduled to see Dr. Glenard 12/25/2024 to address this concern, she plans to keep her TOC appt 04/15 with Dr. Lang.

## 2024-12-18 NOTE — Telephone Encounter (Signed)
 PT Telephone encounter reviewed.

## 2024-12-25 ENCOUNTER — Encounter: Payer: Self-pay | Admitting: Family Medicine

## 2024-12-25 ENCOUNTER — Ambulatory Visit (INDEPENDENT_AMBULATORY_CARE_PROVIDER_SITE_OTHER): Admitting: Family Medicine

## 2024-12-25 VITALS — BP 132/80 | HR 87 | Resp 16 | Ht 62.0 in | Wt 154.7 lb

## 2024-12-25 DIAGNOSIS — Z23 Encounter for immunization: Secondary | ICD-10-CM

## 2024-12-25 DIAGNOSIS — G4733 Obstructive sleep apnea (adult) (pediatric): Secondary | ICD-10-CM | POA: Diagnosis not present

## 2024-12-25 DIAGNOSIS — R7303 Prediabetes: Secondary | ICD-10-CM

## 2024-12-25 DIAGNOSIS — E785 Hyperlipidemia, unspecified: Secondary | ICD-10-CM | POA: Diagnosis not present

## 2024-12-25 DIAGNOSIS — M818 Other osteoporosis without current pathological fracture: Secondary | ICD-10-CM

## 2024-12-25 DIAGNOSIS — M810 Age-related osteoporosis without current pathological fracture: Secondary | ICD-10-CM

## 2024-12-25 DIAGNOSIS — R739 Hyperglycemia, unspecified: Secondary | ICD-10-CM | POA: Diagnosis not present

## 2024-12-25 DIAGNOSIS — Z8639 Personal history of other endocrine, nutritional and metabolic disease: Secondary | ICD-10-CM | POA: Diagnosis not present

## 2024-12-25 DIAGNOSIS — J4520 Mild intermittent asthma, uncomplicated: Secondary | ICD-10-CM | POA: Diagnosis not present

## 2024-12-25 DIAGNOSIS — E876 Hypokalemia: Secondary | ICD-10-CM | POA: Diagnosis not present

## 2024-12-25 MED ORDER — ZEPBOUND 5 MG/0.5ML ~~LOC~~ SOAJ: 5.0000 mg | SUBCUTANEOUS | 2 refills | Status: DC

## 2024-12-25 MED ORDER — ZEPBOUND 2.5 MG/0.5ML ~~LOC~~ SOAJ: 2.5000 mg | SUBCUTANEOUS | 0 refills | Status: DC

## 2024-12-25 NOTE — Progress Notes (Signed)
 Name: Joyce Decker   MRN: [id number]    DOB: 12-Apr-1956   Date:12/25/2024       Progress Note  Subjective  Chief Complaint  Chief Complaint  Patient presents with   Medical Management of Chronic Issues   Discussed the use of AI scribe software for clinical note transcription with the patient, who gave verbal consent to proceed.  History of Present Illness Joyce Decker is a 69 year old female who presents for follow-up on weight management and medication adjustments.  She has a history of obesity and has been managing her weight with medication. Previously, she was on Zepbound  5 mg but had to discontinue it due to insurance changes after switching to Medicare. Since stopping the medication around September or October, she has gained approximately 4.5 pounds. Her highest recorded weight was 183 pounds in April of the previous year, and she is currently at 154 pounds without medication. She has also used Prozempic for weight management in the past.  She has a history of ovarian cancer and follows up annually for this condition.  She has sleep apnea and uses a CPAP machine. Although she is tired of the machine, she continues its use. She also has a history of prediabetes and is interested in monitoring her A1c levels, especially in light of her weight changes.  She has intermittent asthma, managed with albuterol  and Airsupra  as needed, particularly during exercise-induced episodes.  Her social history includes a significant lifestyle change after retiring to care for her mother, who passed away in 10-22-25 of the previous year. This change has impacted her physical activity levels [place]. 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  She has a history of osteoporosis, with a previous bone density scan.    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   Family history of diabetes mellitus 05/06/2008    Past Surgical History:  Procedure Laterality Date   ABDOMINAL HYSTERECTOMY Bilateral    AND BSO   COLONOSCOPY WITH PROPOFOL  N/A 07/03/2015   Procedure: COLONOSCOPY WITH PROPOFOL ;  Surgeon: Donnice Vaughn Manes, MD;  Location: [facility] ENDOSCOPY;  Service: Endoscopy;  Laterality: N/A;   COLONOSCOPY WITH PROPOFOL  N/A 10/20/2021   Procedure: COLONOSCOPY WITH PROPOFOL ;  Surgeon: Toledo, Ladell POUR, MD;  Location: [facility] ENDOSCOPY;  Service: Gastroenterology;  Laterality: N/A;   Dental Implant     ESOPHAGOGASTRODUODENOSCOPY N/A 10/20/2021   Procedure: ESOPHAGOGASTRODUODENOSCOPY (EGD);  Surgeon: Toledo, Ladell POUR, MD;  Location: [facility] ENDOSCOPY;  Service: Gastroenterology;  Laterality: N/A;   OOPHORECTOMY     TONSILLECTOMY AND ADENOIDECTOMY      Family History  Problem Relation Age of Onset   Diabetes Mother    Hypertension Mother    Diabetes Maternal Aunt    Heart disease Maternal Aunt    Hypertension Maternal Aunt    Diabetes Maternal Uncle  Heart disease Maternal Uncle    Hypertension Maternal Uncle    Colon cancer Maternal Uncle    Brain cancer Maternal Grandmother    Diabetes Cousin    Breast cancer Cousin    Melanoma Other    Stomach cancer Other    Prostate cancer Maternal Uncle    Prostate cancer Maternal Uncle    Breast cancer Cousin    Breast cancer Cousin    Breast cancer Cousin    BRCA  1/2 Son    Breast cancer Cousin    Breast cancer Cousin     Social History   Tobacco Use   Smoking status: Former    Current packs/day: 0.50    Types: Cigarettes   Smokeless tobacco: Never   Tobacco comments:    35 years ago  Substance Use Topics   Alcohol use: No    Current Medications[1]  Allergies[2]  I personally reviewed active problem list, medication list, allergies, family history with the patient/caregiver today.   ROS  Ten systems reviewed and is negative except as mentioned in HPI    Objective Physical Exam  CONSTITUTIONAL: Patient appears well-developed and well-nourished.  No distress. HEENT: Head atraumatic, normocephalic, neck supple. CARDIOVASCULAR: Normal rate, regular rhythm and normal heart sounds.  No murmur heard. No BLE edema. PULMONARY: Effort normal and breath sounds normal. No respiratory distress. ABDOMINAL: There is no tenderness or distention. MUSCULOSKELETAL: Normal gait. Without gross motor or sensory deficit. PSYCHIATRIC: Patient has a normal mood and affect. behavior is normal. Judgment and thought content normal.  Vitals:   12/25/24 0842  BP: 132/80  Pulse: 87  Resp: 16  SpO2: 99%  Weight: 154 lb 11.2 oz (70.2 kg)  Height: 5' 2 (1.575 m)    Body mass index is 28.3 kg/m.   PHQ2/9:    12/25/2024    8:37 AM 06/18/2024    2:03 PM 04/01/2024    1:29 PM 02/28/2023    1:53 PM 09/28/2022    3:58 PM  Depression screen PHQ 2/9  Decreased Interest 0 0 0 0 0  Down, Depressed, Hopeless 0 0 0 1 0  PHQ - 2 Score 0 0 0 1 0  Altered sleeping   0 0 3  Tired, decreased energy   0 2 1  Change in appetite   0 2 0  Feeling bad or failure about yourself    0 1 0  Trouble concentrating   0 0 0  Moving slowly or fidgety/restless   0 0 0  Suicidal thoughts   0 0 0  PHQ-9 Score   0  6  4   Difficult doing work/chores    Somewhat difficult Somewhat difficult     Data saved with a previous flowsheet row definition    phq 9 is  negative  Fall Risk:    12/25/2024    8:36 AM 02/27/2024   11:14 AM 02/28/2023    1:53 PM 09/28/2022    3:58 PM 05/13/2022    3:54 PM  Fall Risk   Falls in the past year? 0 0 0 0 0  Number falls in past yr: 0 0 0 0 0  Injury with Fall? 0 0  0  0  0   Risk for fall due to : No Fall Risks No Fall Risks No Fall Risks No Fall Risks   Follow up Falls evaluation completed Falls evaluation completed  Falls evaluation completed       Data saved with a previous flowsheet  row definition     Assessment & Plan Obesity and overweight BMI 28.3, weight gain post-Zepbound  discontinuation, reduced activity due to lifestyle changes. - Restart Zepbound  at 2.5 mg, titrate to 5 mg as tolerated. - Encouraged Silver Sneakers for exercise. - Discussed lifestyle modifications and weight loss benefits.  Prediabetes Weight loss expected to improve A1c. - Ordered A1c test before next visit. - Monitor weight loss impact on A1c.  Osteoporosis Confirmed by bone density screening, no current treatment. - Ordered bone density test with next mammogram. - Checked vitamin D  and thyroid levels, parathyroid level  - Will discuss treatment options including alendronate or injections.  Obstructive sleep apnea Managed with CPAP, high hemoglobin possibly related. - Continue CPAP therapy.  Dyslipidemia Previous elevated cholesterol levels. - Ordered lipid panel before next visit.  Hypokalemia History of low potassium levels. - Ordered comprehensive metabolic panel including potassium levels.  Mild intermittent asthma Well-controlled with albuterol , exercise-induced symptoms. - Continue albuterol  inhaler as needed.  General health maintenance Discussed importance of screenings and lifestyle modifications. - Encouraged Silver Sneakers participation. - Discussed new hobbies and activities for mental well-being.        [1]  Current Outpatient Medications:    albuterol  (VENTOLIN  HFA) 108 (90 Base) MCG/ACT  inhaler, Inhale 2 puffs into the lungs every 4 (four) hours as needed for wheezing or shortness of breath (asthma exacerbation and exercise induced bronchospasm (use before exercise))., Disp: 1 each, Rfl: 5   Albuterol -Budesonide (AIRSUPRA ) 90-80 MCG/ACT AERO, Inhale 2 puffs into the lungs every 4 (four) hours as needed (asthma sx). Rinse out mouth after each use (swish and spit), Disp: 10.7 g, Rfl: 2   tirzepatide  (ZEPBOUND ) 5 MG/0.5ML Pen, Inject 5 mg into the skin once a week., Disp: 2 mL, Rfl: 0   tretinoin  (RETIN-A ) 0.05 % cream, APPLY TO AFFECTED AREA EVERY DAY AT BEDTIME, Disp: 45 g, Rfl: 1   valACYclovir  (VALTREX ) 1000 MG tablet, TAKE 2 TABLETS A FIRST SIGN OF FLARE REPEAT IN 12 HOURS, Disp: 90 tablet, Rfl: 3 [2]  Allergies Allergen Reactions   Other Other (See Comments)    Wheat barley and Grain unknown allergy, possible cause headache.   Tape Other (See Comments)    Blisters   Gadobenate Nausea Only    PT became nauseous with contrast, but did not vomit.     Iodinated Contrast Media Nausea Only

## 2025-01-03 ENCOUNTER — Telehealth: Payer: Self-pay | Admitting: Family Medicine

## 2025-01-03 DIAGNOSIS — Z8639 Personal history of other endocrine, nutritional and metabolic disease: Secondary | ICD-10-CM

## 2025-01-03 MED ORDER — ZEPBOUND 5 MG/0.5ML ~~LOC~~ SOAJ: 5.0000 mg | SUBCUTANEOUS | 2 refills | Status: AC

## 2025-01-03 MED ORDER — ZEPBOUND 2.5 MG/0.5ML ~~LOC~~ SOAJ: 2.5000 mg | SUBCUTANEOUS | 0 refills | Status: AC

## 2025-01-03 NOTE — Telephone Encounter (Signed)
 [facility] [place], [place] -   [address]  [phone] [place], [place] [address]  Phone: [phone]  Fax: [phone]

## 2025-01-03 NOTE — Telephone Encounter (Signed)
 Copied from CRM #[id number]. Topic: Clinical - Prescription Issue >> Jan 03, 2025  3:54 PM Avram MATSU wrote: Reason for CRM:  patient stated her rx was not sent in to the right [facility]. She has not gotten a message from the pharmacy that her medication was ready.  [facility]  [phone] [place] [address] WEP:[phone]

## 2025-01-03 NOTE — Telephone Encounter (Signed)
 Re sending to right pharmacy, advised pt

## 2025-03-12 ENCOUNTER — Encounter: Payer: Self-pay | Admitting: Family Medicine

## 2025-04-18 ENCOUNTER — Ambulatory Visit: Admitting: Family Medicine

## 2025-06-11 ENCOUNTER — Other Ambulatory Visit

## 2025-06-18 ENCOUNTER — Ambulatory Visit: Admitting: Oncology

## 2025-06-19 ENCOUNTER — Inpatient Hospital Stay: Payer: Self-pay | Admitting: Oncology
